# Patient Record
Sex: Female | Born: 1945 | Race: White | Hispanic: No | Smoking: Former smoker
Health system: Southern US, Community
[De-identification: ages and names within clinical notes are randomized; demographics above are authoritative.]

## PROBLEM LIST (undated history)

## (undated) DIAGNOSIS — L659 Nonscarring hair loss, unspecified: Secondary | ICD-10-CM

## (undated) DIAGNOSIS — E785 Hyperlipidemia, unspecified: Secondary | ICD-10-CM

## (undated) DIAGNOSIS — R42 Dizziness and giddiness: Secondary | ICD-10-CM

## (undated) DIAGNOSIS — F329 Major depressive disorder, single episode, unspecified: Secondary | ICD-10-CM

## (undated) DIAGNOSIS — Z923 Personal history of irradiation: Secondary | ICD-10-CM

## (undated) DIAGNOSIS — Z9181 History of falling: Secondary | ICD-10-CM

## (undated) DIAGNOSIS — Z1231 Encounter for screening mammogram for malignant neoplasm of breast: Secondary | ICD-10-CM

## (undated) DIAGNOSIS — F419 Anxiety disorder, unspecified: Secondary | ICD-10-CM

## (undated) DIAGNOSIS — I6521 Occlusion and stenosis of right carotid artery: Secondary | ICD-10-CM

## (undated) DIAGNOSIS — R809 Proteinuria, unspecified: Secondary | ICD-10-CM

## (undated) DIAGNOSIS — R011 Cardiac murmur, unspecified: Secondary | ICD-10-CM

## (undated) DIAGNOSIS — E1022 Type 1 diabetes mellitus with diabetic chronic kidney disease: Secondary | ICD-10-CM

## (undated) DIAGNOSIS — K3184 Gastroparesis: Secondary | ICD-10-CM

## (undated) DIAGNOSIS — J302 Other seasonal allergic rhinitis: Secondary | ICD-10-CM

## (undated) DIAGNOSIS — J309 Allergic rhinitis, unspecified: Secondary | ICD-10-CM

## (undated) DIAGNOSIS — I350 Nonrheumatic aortic (valve) stenosis: Secondary | ICD-10-CM

## (undated) DIAGNOSIS — R928 Other abnormal and inconclusive findings on diagnostic imaging of breast: Secondary | ICD-10-CM

## (undated) DIAGNOSIS — Z6830 Body mass index (BMI) 30.0-30.9, adult: Secondary | ICD-10-CM

## (undated) DIAGNOSIS — C3490 Malignant neoplasm of unspecified part of unspecified bronchus or lung: Secondary | ICD-10-CM

## (undated) DIAGNOSIS — M1990 Unspecified osteoarthritis, unspecified site: Secondary | ICD-10-CM

## (undated) DIAGNOSIS — D649 Anemia, unspecified: Secondary | ICD-10-CM

## (undated) DIAGNOSIS — C801 Malignant (primary) neoplasm, unspecified: Secondary | ICD-10-CM

## (undated) DIAGNOSIS — I951 Orthostatic hypotension: Secondary | ICD-10-CM

## (undated) DIAGNOSIS — Z1331 Encounter for screening for depression: Secondary | ICD-10-CM

## (undated) DIAGNOSIS — E039 Hypothyroidism, unspecified: Secondary | ICD-10-CM

## (undated) DIAGNOSIS — I83812 Varicose veins of left lower extremities with pain: Secondary | ICD-10-CM

## (undated) DIAGNOSIS — N959 Unspecified menopausal and perimenopausal disorder: Secondary | ICD-10-CM

## (undated) DIAGNOSIS — N6019 Diffuse cystic mastopathy of unspecified breast: Secondary | ICD-10-CM

## (undated) DIAGNOSIS — Z1211 Encounter for screening for malignant neoplasm of colon: Secondary | ICD-10-CM

## (undated) DIAGNOSIS — K5909 Other constipation: Secondary | ICD-10-CM

## (undated) DIAGNOSIS — J701 Chronic and other pulmonary manifestations due to radiation: Secondary | ICD-10-CM

## (undated) DIAGNOSIS — E119 Type 2 diabetes mellitus without complications: Secondary | ICD-10-CM

## (undated) DIAGNOSIS — Z1339 Encounter for screening examination for other mental health and behavioral disorders: Secondary | ICD-10-CM

## (undated) DIAGNOSIS — L570 Actinic keratosis: Secondary | ICD-10-CM

## (undated) DIAGNOSIS — J069 Acute upper respiratory infection, unspecified: Secondary | ICD-10-CM

## (undated) DIAGNOSIS — R0602 Shortness of breath: Secondary | ICD-10-CM

## (undated) DIAGNOSIS — M79673 Pain in unspecified foot: Secondary | ICD-10-CM

## (undated) DIAGNOSIS — K219 Gastro-esophageal reflux disease without esophagitis: Secondary | ICD-10-CM

## (undated) DIAGNOSIS — J449 Chronic obstructive pulmonary disease, unspecified: Secondary | ICD-10-CM

## (undated) DIAGNOSIS — N6310 Unspecified lump in the right breast, unspecified quadrant: Secondary | ICD-10-CM

## (undated) DIAGNOSIS — J019 Acute sinusitis, unspecified: Secondary | ICD-10-CM

## (undated) DIAGNOSIS — M722 Plantar fascial fibromatosis: Secondary | ICD-10-CM

## (undated) DIAGNOSIS — I2510 Atherosclerotic heart disease of native coronary artery without angina pectoris: Secondary | ICD-10-CM

## (undated) DIAGNOSIS — L309 Dermatitis, unspecified: Secondary | ICD-10-CM

## (undated) DIAGNOSIS — Z85118 Personal history of other malignant neoplasm of bronchus and lung: Secondary | ICD-10-CM

## (undated) DIAGNOSIS — M791 Myalgia, unspecified site: Secondary | ICD-10-CM

## (undated) DIAGNOSIS — R5383 Other fatigue: Secondary | ICD-10-CM

## (undated) DIAGNOSIS — G629 Polyneuropathy, unspecified: Secondary | ICD-10-CM

## (undated) DIAGNOSIS — I10 Essential (primary) hypertension: Secondary | ICD-10-CM

## (undated) DIAGNOSIS — I5030 Unspecified diastolic (congestive) heart failure: Secondary | ICD-10-CM

## (undated) DIAGNOSIS — Z780 Asymptomatic menopausal state: Secondary | ICD-10-CM

## (undated) DIAGNOSIS — M2550 Pain in unspecified joint: Secondary | ICD-10-CM

## (undated) DIAGNOSIS — R569 Unspecified convulsions: Secondary | ICD-10-CM

## (undated) DIAGNOSIS — F32A Depression, unspecified: Secondary | ICD-10-CM

## (undated) HISTORY — DX: Gastroparesis: K31.84

## (undated) HISTORY — DX: Encounter for screening for depression: Z13.31

## (undated) HISTORY — DX: Unspecified menopausal and perimenopausal disorder: N95.9

## (undated) HISTORY — DX: Essential (primary) hypertension: I10

## (undated) HISTORY — DX: Other fatigue: R53.83

## (undated) HISTORY — DX: Atherosclerotic heart disease of native coronary artery without angina pectoris: I25.10

## (undated) HISTORY — DX: Malignant neoplasm of unspecified part of unspecified bronchus or lung: C34.90

## (undated) HISTORY — DX: Body mass index (BMI) 30.0-30.9, adult: Z68.30

## (undated) HISTORY — DX: Depression, unspecified: F32.A

## (undated) HISTORY — DX: Pain in unspecified foot: M79.673

## (undated) HISTORY — PX: ELBOW SURGERY: SHX618

## (undated) HISTORY — DX: Actinic keratosis: L57.0

## (undated) HISTORY — DX: Shortness of breath: R06.02

## (undated) HISTORY — DX: Varicose veins of left lower extremity with pain: I83.812

## (undated) HISTORY — DX: Personal history of other malignant neoplasm of bronchus and lung: Z85.118

## (undated) HISTORY — DX: Encounter for screening for malignant neoplasm of colon: Z12.11

## (undated) HISTORY — DX: Nonscarring hair loss, unspecified: L65.9

## (undated) HISTORY — DX: Chronic and other pulmonary manifestations due to radiation: J70.1

## (undated) HISTORY — DX: Orthostatic hypotension: I95.1

## (undated) HISTORY — DX: Acute upper respiratory infection, unspecified: J06.9

## (undated) HISTORY — DX: Encounter for screening mammogram for malignant neoplasm of breast: Z12.31

## (undated) HISTORY — DX: Personal history of irradiation: Z92.3

## (undated) HISTORY — PX: CATARACT EXTRACTION, BILATERAL: SHX1313

## (undated) HISTORY — DX: Proteinuria, unspecified: R80.9

## (undated) HISTORY — DX: Cardiac murmur, unspecified: R01.1

## (undated) HISTORY — DX: Occlusion and stenosis of right carotid artery: I65.21

## (undated) HISTORY — DX: Hypothyroidism, unspecified: E03.9

## (undated) HISTORY — DX: Major depressive disorder, single episode, unspecified: F32.9

## (undated) HISTORY — DX: Unspecified lump in the right breast, unspecified quadrant: N63.10

## (undated) HISTORY — DX: Hyperlipidemia, unspecified: E78.5

## (undated) HISTORY — DX: Dermatitis, unspecified: L30.9

## (undated) HISTORY — DX: Plantar fascial fibromatosis: M72.2

## (undated) HISTORY — PX: THYROIDECTOMY: SHX17

## (undated) HISTORY — DX: Anemia, unspecified: D64.9

## (undated) HISTORY — DX: History of falling: Z91.81

## (undated) HISTORY — DX: Myalgia, unspecified site: M79.10

## (undated) HISTORY — PX: CARPAL TUNNEL RELEASE: SHX101

## (undated) HISTORY — DX: Acute sinusitis, unspecified: J01.90

## (undated) HISTORY — DX: Asymptomatic menopausal state: Z78.0

## (undated) HISTORY — PX: BREAST BIOPSY: SHX20

## (undated) HISTORY — PX: SHOULDER SURGERY: SHX246

## (undated) HISTORY — DX: Polyneuropathy, unspecified: G62.9

## (undated) HISTORY — DX: Encounter for screening examination for other mental health and behavioral disorders: Z13.39

## (undated) HISTORY — DX: Nonrheumatic aortic (valve) stenosis: I35.0

## (undated) HISTORY — DX: Other abnormal and inconclusive findings on diagnostic imaging of breast: R92.8

## (undated) HISTORY — DX: Malignant (primary) neoplasm, unspecified: C80.1

## (undated) HISTORY — DX: Type 2 diabetes mellitus without complications: E11.9

## (undated) HISTORY — DX: Dizziness and giddiness: R42

## (undated) HISTORY — DX: Chronic obstructive pulmonary disease, unspecified: J44.9

## (undated) HISTORY — DX: Pain in unspecified joint: M25.50

## (undated) HISTORY — DX: Other constipation: K59.09

## (undated) HISTORY — DX: Other seasonal allergic rhinitis: J30.2

## (undated) HISTORY — DX: Diffuse cystic mastopathy of unspecified breast: N60.19

## (undated) HISTORY — PX: TOTAL ABDOMINAL HYSTERECTOMY: SHX209

## (undated) HISTORY — DX: Allergic rhinitis, unspecified: J30.9

---
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
## 1997-06-12 DIAGNOSIS — C3490 Malignant neoplasm of unspecified part of unspecified bronchus or lung: Secondary | ICD-10-CM | POA: Insufficient documentation

## 1997-08-11 ENCOUNTER — Ambulatory Visit (HOSPITAL_BASED_OUTPATIENT_CLINIC_OR_DEPARTMENT_OTHER): Admission: RE | Admit: 1997-08-11 | Discharge: 1997-08-11 | Payer: Self-pay | Admitting: General Surgery

## 1998-06-06 ENCOUNTER — Ambulatory Visit (HOSPITAL_COMMUNITY): Admission: RE | Admit: 1998-06-06 | Discharge: 1998-06-06 | Payer: Self-pay | Admitting: Gastroenterology

## 1998-08-09 ENCOUNTER — Other Ambulatory Visit: Admission: RE | Admit: 1998-08-09 | Discharge: 1998-08-09 | Payer: Self-pay | Admitting: Obstetrics and Gynecology

## 1998-12-21 ENCOUNTER — Encounter: Payer: Self-pay | Admitting: Hematology and Oncology

## 1998-12-21 ENCOUNTER — Encounter: Admission: RE | Admit: 1998-12-21 | Discharge: 1998-12-21 | Payer: Self-pay | Admitting: Hematology and Oncology

## 1999-04-06 ENCOUNTER — Encounter: Payer: Self-pay | Admitting: Hematology and Oncology

## 1999-04-06 ENCOUNTER — Encounter: Admission: RE | Admit: 1999-04-06 | Discharge: 1999-04-06 | Payer: Self-pay | Admitting: Hematology and Oncology

## 1999-06-04 ENCOUNTER — Encounter: Admission: RE | Admit: 1999-06-04 | Discharge: 1999-06-04 | Payer: Self-pay | Admitting: Hematology and Oncology

## 1999-06-04 ENCOUNTER — Encounter: Payer: Self-pay | Admitting: Hematology and Oncology

## 1999-07-17 ENCOUNTER — Encounter: Admission: RE | Admit: 1999-07-17 | Discharge: 1999-07-17 | Payer: Self-pay | Admitting: Hematology and Oncology

## 1999-07-17 ENCOUNTER — Encounter: Payer: Self-pay | Admitting: Hematology and Oncology

## 1999-10-18 ENCOUNTER — Other Ambulatory Visit: Admission: RE | Admit: 1999-10-18 | Discharge: 1999-10-18 | Payer: Self-pay | Admitting: Obstetrics and Gynecology

## 1999-11-09 ENCOUNTER — Encounter: Payer: Self-pay | Admitting: Obstetrics and Gynecology

## 1999-11-09 ENCOUNTER — Encounter: Admission: RE | Admit: 1999-11-09 | Discharge: 1999-11-09 | Payer: Self-pay | Admitting: Obstetrics and Gynecology

## 2000-01-14 ENCOUNTER — Encounter: Payer: Self-pay | Admitting: Hematology and Oncology

## 2000-01-14 ENCOUNTER — Ambulatory Visit (HOSPITAL_COMMUNITY): Admission: RE | Admit: 2000-01-14 | Discharge: 2000-01-14 | Payer: Self-pay | Admitting: Hematology and Oncology

## 2000-01-15 ENCOUNTER — Ambulatory Visit (HOSPITAL_COMMUNITY): Admission: RE | Admit: 2000-01-15 | Discharge: 2000-01-15 | Payer: Self-pay | Admitting: Hematology and Oncology

## 2000-01-15 ENCOUNTER — Encounter: Payer: Self-pay | Admitting: Hematology and Oncology

## 2000-05-22 ENCOUNTER — Encounter: Payer: Self-pay | Admitting: Hematology and Oncology

## 2000-05-22 ENCOUNTER — Ambulatory Visit (HOSPITAL_COMMUNITY): Admission: RE | Admit: 2000-05-22 | Discharge: 2000-05-22 | Payer: Self-pay | Admitting: Hematology and Oncology

## 2000-06-04 ENCOUNTER — Encounter: Payer: Self-pay | Admitting: Hematology and Oncology

## 2000-06-04 ENCOUNTER — Encounter: Admission: RE | Admit: 2000-06-04 | Discharge: 2000-06-04 | Payer: Self-pay | Admitting: Hematology and Oncology

## 2000-06-10 ENCOUNTER — Ambulatory Visit (HOSPITAL_COMMUNITY): Admission: RE | Admit: 2000-06-10 | Discharge: 2000-06-10 | Payer: Self-pay | Admitting: Hematology and Oncology

## 2000-06-10 ENCOUNTER — Encounter: Payer: Self-pay | Admitting: Hematology and Oncology

## 2001-01-29 ENCOUNTER — Ambulatory Visit (HOSPITAL_COMMUNITY): Admission: RE | Admit: 2001-01-29 | Discharge: 2001-01-29 | Payer: Self-pay | Admitting: Hematology and Oncology

## 2001-01-29 ENCOUNTER — Encounter: Payer: Self-pay | Admitting: Hematology and Oncology

## 2001-08-12 ENCOUNTER — Encounter: Payer: Self-pay | Admitting: Hematology and Oncology

## 2001-08-12 ENCOUNTER — Ambulatory Visit (HOSPITAL_COMMUNITY): Admission: RE | Admit: 2001-08-12 | Discharge: 2001-08-12 | Payer: Self-pay | Admitting: Hematology and Oncology

## 2002-06-08 ENCOUNTER — Encounter: Payer: Self-pay | Admitting: *Deleted

## 2002-06-08 ENCOUNTER — Encounter: Admission: RE | Admit: 2002-06-08 | Discharge: 2002-06-08 | Payer: Self-pay | Admitting: *Deleted

## 2003-03-14 ENCOUNTER — Ambulatory Visit (HOSPITAL_COMMUNITY): Admission: RE | Admit: 2003-03-14 | Discharge: 2003-03-14 | Payer: Self-pay | Admitting: Oncology

## 2003-06-10 ENCOUNTER — Ambulatory Visit (HOSPITAL_COMMUNITY): Admission: RE | Admit: 2003-06-10 | Discharge: 2003-06-10 | Payer: Self-pay | Admitting: Oncology

## 2004-04-02 ENCOUNTER — Ambulatory Visit (HOSPITAL_COMMUNITY): Admission: RE | Admit: 2004-04-02 | Discharge: 2004-04-02 | Payer: Self-pay | Admitting: Oncology

## 2004-04-02 ENCOUNTER — Ambulatory Visit: Payer: Self-pay | Admitting: Oncology

## 2004-05-24 ENCOUNTER — Encounter: Admission: RE | Admit: 2004-05-24 | Discharge: 2004-05-24 | Payer: Self-pay | Admitting: Oncology

## 2004-12-05 ENCOUNTER — Ambulatory Visit: Payer: Self-pay | Admitting: Oncology

## 2005-02-12 ENCOUNTER — Ambulatory Visit: Payer: Self-pay | Admitting: Oncology

## 2005-02-12 ENCOUNTER — Ambulatory Visit (HOSPITAL_COMMUNITY): Admission: RE | Admit: 2005-02-12 | Discharge: 2005-02-12 | Payer: Self-pay | Admitting: Oncology

## 2005-03-11 ENCOUNTER — Ambulatory Visit (HOSPITAL_COMMUNITY): Admission: RE | Admit: 2005-03-11 | Discharge: 2005-03-11 | Payer: Self-pay | Admitting: Oncology

## 2005-06-07 ENCOUNTER — Ambulatory Visit: Payer: Self-pay | Admitting: Oncology

## 2005-06-10 LAB — CBC WITH DIFFERENTIAL/PLATELET
BASO%: 0.2 % (ref 0.0–2.0)
EOS%: 3 % (ref 0.0–7.0)
MCH: 29 pg (ref 26.0–34.0)
MCV: 85.2 fL (ref 81.0–101.0)
MONO%: 7.3 % (ref 0.0–13.0)
RBC: 3.77 10*6/uL (ref 3.70–5.32)
RDW: 14 % (ref 11.3–14.5)

## 2005-06-10 LAB — COMPREHENSIVE METABOLIC PANEL
ALT: 14 U/L (ref 0–40)
AST: 19 U/L (ref 0–37)
Alkaline Phosphatase: 72 U/L (ref 39–117)
CO2: 23 mEq/L (ref 19–32)
Creatinine, Ser: 1 mg/dL (ref 0.4–1.2)
Sodium: 135 mEq/L (ref 135–145)
Total Bilirubin: 0.4 mg/dL (ref 0.3–1.2)
Total Protein: 6.3 g/dL (ref 6.0–8.3)

## 2005-06-10 LAB — IRON AND TIBC
%SAT: 19 % — ABNORMAL LOW (ref 20–55)
TIBC: 258 ug/dL (ref 250–470)
UIBC: 209 ug/dL

## 2005-06-10 LAB — LACTATE DEHYDROGENASE: LDH: 169 U/L (ref 94–250)

## 2005-06-13 ENCOUNTER — Ambulatory Visit (HOSPITAL_COMMUNITY): Admission: RE | Admit: 2005-06-13 | Discharge: 2005-06-13 | Payer: Self-pay | Admitting: Oncology

## 2005-06-18 LAB — FOLATE RBC: RBC Folate: 1074 ng/mL — ABNORMAL HIGH (ref 180–600)

## 2005-06-18 LAB — VITAMIN B12: Vitamin B-12: 322 pg/mL (ref 211–911)

## 2005-06-18 LAB — FERRITIN: Ferritin: 40 ng/mL (ref 10–291)

## 2005-06-18 LAB — IRON AND TIBC
TIBC: 250 ug/dL (ref 250–470)
UIBC: 184 ug/dL

## 2005-07-01 ENCOUNTER — Encounter: Admission: RE | Admit: 2005-07-01 | Discharge: 2005-07-01 | Payer: Self-pay | Admitting: Oncology

## 2005-08-06 ENCOUNTER — Ambulatory Visit: Payer: Self-pay | Admitting: Oncology

## 2005-09-10 LAB — COMPREHENSIVE METABOLIC PANEL
ALT: 14 U/L (ref 0–40)
AST: 21 U/L (ref 0–37)
CO2: 25 mEq/L (ref 19–32)
Sodium: 137 mEq/L (ref 135–145)
Total Bilirubin: 0.4 mg/dL (ref 0.3–1.2)
Total Protein: 6.6 g/dL (ref 6.0–8.3)

## 2005-09-10 LAB — CBC WITH DIFFERENTIAL/PLATELET
BASO%: 1.1 % (ref 0.0–2.0)
EOS%: 2.4 % (ref 0.0–7.0)
LYMPH%: 16.4 % (ref 14.0–48.0)
MCHC: 34 g/dL (ref 32.0–36.0)
MONO#: 0.4 10*3/uL (ref 0.1–0.9)
Platelets: 326 10*3/uL (ref 145–400)
RBC: 3.84 10*6/uL (ref 3.70–5.32)
WBC: 5.7 10*3/uL (ref 3.9–10.0)
lymph#: 0.9 10*3/uL (ref 0.9–3.3)

## 2005-09-10 LAB — LACTATE DEHYDROGENASE: LDH: 179 U/L (ref 94–250)

## 2005-09-25 ENCOUNTER — Ambulatory Visit: Payer: Self-pay | Admitting: Oncology

## 2006-01-03 ENCOUNTER — Ambulatory Visit: Payer: Self-pay | Admitting: Oncology

## 2006-01-15 LAB — CBC WITH DIFFERENTIAL/PLATELET
BASO%: 0.3 % (ref 0.0–2.0)
LYMPH%: 13.8 % — ABNORMAL LOW (ref 14.0–48.0)
MCHC: 33.9 g/dL (ref 32.0–36.0)
MCV: 87.1 fL (ref 81.0–101.0)
MONO%: 7.6 % (ref 0.0–13.0)
Platelets: 332 10*3/uL (ref 145–400)
RBC: 4.16 10*6/uL (ref 3.70–5.32)

## 2006-01-15 LAB — COMPREHENSIVE METABOLIC PANEL
AST: 17 U/L (ref 0–37)
BUN: 16 mg/dL (ref 6–23)
Calcium: 9.7 mg/dL (ref 8.4–10.5)
Chloride: 103 mEq/L (ref 96–112)
Creatinine, Ser: 1.14 mg/dL (ref 0.40–1.20)

## 2006-01-15 LAB — LACTATE DEHYDROGENASE: LDH: 183 U/L (ref 94–250)

## 2006-01-15 LAB — IRON AND TIBC
TIBC: 222 ug/dL — ABNORMAL LOW (ref 250–470)
UIBC: 169 ug/dL

## 2006-05-08 ENCOUNTER — Ambulatory Visit: Payer: Self-pay | Admitting: Oncology

## 2006-05-13 ENCOUNTER — Ambulatory Visit (HOSPITAL_COMMUNITY): Admission: RE | Admit: 2006-05-13 | Discharge: 2006-05-13 | Payer: Self-pay | Admitting: Oncology

## 2006-05-13 LAB — CBC WITH DIFFERENTIAL/PLATELET
Basophils Absolute: 0 10*3/uL (ref 0.0–0.1)
Eosinophils Absolute: 0.1 10*3/uL (ref 0.0–0.5)
HGB: 13.1 g/dL (ref 11.6–15.9)
MONO#: 0.4 10*3/uL (ref 0.1–0.9)
MONO%: 5.2 % (ref 0.0–13.0)
NEUT#: 5.7 10*3/uL (ref 1.5–6.5)
RBC: 4.46 10*6/uL (ref 3.70–5.32)
RDW: 13.8 % (ref 11.3–14.5)
WBC: 7.1 10*3/uL (ref 3.9–10.0)
lymph#: 0.8 10*3/uL — ABNORMAL LOW (ref 0.9–3.3)

## 2006-05-13 LAB — COMPREHENSIVE METABOLIC PANEL
Albumin: 4.1 g/dL (ref 3.5–5.2)
Alkaline Phosphatase: 83 U/L (ref 39–117)
BUN: 19 mg/dL (ref 6–23)
Calcium: 9.8 mg/dL (ref 8.4–10.5)
Chloride: 101 mEq/L (ref 96–112)
Glucose, Bld: 225 mg/dL — ABNORMAL HIGH (ref 70–99)
Potassium: 5 mEq/L (ref 3.5–5.3)
Sodium: 136 mEq/L (ref 135–145)
Total Protein: 6.6 g/dL (ref 6.0–8.3)

## 2006-05-13 LAB — IRON AND TIBC
%SAT: 24 % (ref 20–55)
Iron: 61 ug/dL (ref 42–145)
UIBC: 196 ug/dL

## 2006-05-13 LAB — FERRITIN: Ferritin: 160 ng/mL (ref 10–291)

## 2006-09-16 ENCOUNTER — Ambulatory Visit: Payer: Self-pay | Admitting: Oncology

## 2006-09-18 LAB — CBC WITH DIFFERENTIAL/PLATELET
BASO%: 0.9 % (ref 0.0–2.0)
EOS%: 2.5 % (ref 0.0–7.0)
MCH: 29.5 pg (ref 26.0–34.0)
MCHC: 34.5 g/dL (ref 32.0–36.0)
MONO#: 0.5 10*3/uL (ref 0.1–0.9)
RBC: 4.1 10*6/uL (ref 3.70–5.32)
RDW: 13.9 % (ref 11.3–14.5)
WBC: 6.7 10*3/uL (ref 3.9–10.0)
lymph#: 0.8 10*3/uL — ABNORMAL LOW (ref 0.9–3.3)

## 2006-09-18 LAB — COMPREHENSIVE METABOLIC PANEL
ALT: 12 U/L (ref 0–35)
AST: 16 U/L (ref 0–37)
Albumin: 3.7 g/dL (ref 3.5–5.2)
Calcium: 9.5 mg/dL (ref 8.4–10.5)
Chloride: 103 mEq/L (ref 96–112)
Creatinine, Ser: 1.01 mg/dL (ref 0.40–1.20)
Potassium: 5.3 mEq/L (ref 3.5–5.3)
Sodium: 138 mEq/L (ref 135–145)
Total Protein: 6.2 g/dL (ref 6.0–8.3)

## 2006-09-18 LAB — IRON AND TIBC
%SAT: 30 % (ref 20–55)
Iron: 74 ug/dL (ref 42–145)
TIBC: 243 ug/dL — ABNORMAL LOW (ref 250–470)
UIBC: 169 ug/dL

## 2006-09-18 LAB — LACTATE DEHYDROGENASE: LDH: 161 U/L (ref 94–250)

## 2007-03-17 ENCOUNTER — Ambulatory Visit: Payer: Self-pay | Admitting: Oncology

## 2007-03-19 LAB — CBC WITH DIFFERENTIAL/PLATELET
Basophils Absolute: 0 10*3/uL (ref 0.0–0.1)
EOS%: 2.7 % (ref 0.0–7.0)
HGB: 12.5 g/dL (ref 11.6–15.9)
LYMPH%: 15.3 % (ref 14.0–48.0)
MCHC: 34 g/dL (ref 32.0–36.0)
NEUT#: 5.1 10*3/uL (ref 1.5–6.5)
NEUT%: 74.2 % (ref 39.6–76.8)
RBC: 4.43 10*6/uL (ref 3.70–5.32)
lymph#: 1 10*3/uL (ref 0.9–3.3)

## 2007-03-19 LAB — COMPREHENSIVE METABOLIC PANEL
AST: 19 U/L (ref 0–37)
Alkaline Phosphatase: 85 U/L (ref 39–117)
BUN: 22 mg/dL (ref 6–23)
Calcium: 9.4 mg/dL (ref 8.4–10.5)
Sodium: 135 mEq/L (ref 135–145)
Total Bilirubin: 0.4 mg/dL (ref 0.3–1.2)
Total Protein: 6.6 g/dL (ref 6.0–8.3)

## 2007-03-19 LAB — IRON AND TIBC
%SAT: 19 % — ABNORMAL LOW (ref 20–55)
Iron: 45 ug/dL (ref 42–145)
UIBC: 196 ug/dL

## 2007-03-19 LAB — LACTATE DEHYDROGENASE: LDH: 169 U/L (ref 94–250)

## 2007-09-17 ENCOUNTER — Ambulatory Visit: Payer: Self-pay | Admitting: Oncology

## 2007-09-21 ENCOUNTER — Ambulatory Visit (HOSPITAL_COMMUNITY): Admission: RE | Admit: 2007-09-21 | Discharge: 2007-09-21 | Payer: Self-pay | Admitting: Oncology

## 2007-09-21 LAB — CBC WITH DIFFERENTIAL/PLATELET
BASO%: 0.7 % (ref 0.0–2.0)
Basophils Absolute: 0 10*3/uL (ref 0.0–0.1)
Eosinophils Absolute: 0.3 10*3/uL (ref 0.0–0.5)
LYMPH%: 14.6 % (ref 14.0–48.0)
MCH: 28.7 pg (ref 26.0–34.0)
MCHC: 33.6 g/dL (ref 32.0–36.0)
MCV: 85.2 fL (ref 81.0–101.0)
MONO%: 7 % (ref 0.0–13.0)
RDW: 14.6 % — ABNORMAL HIGH (ref 11.3–14.5)
WBC: 6.7 10*3/uL (ref 3.9–10.0)

## 2007-09-21 LAB — FERRITIN: Ferritin: 186 ng/mL (ref 10–291)

## 2007-09-21 LAB — COMPREHENSIVE METABOLIC PANEL
AST: 16 U/L (ref 0–37)
Calcium: 9.9 mg/dL (ref 8.4–10.5)
Creatinine, Ser: 0.97 mg/dL (ref 0.40–1.20)
Glucose, Bld: 217 mg/dL — ABNORMAL HIGH (ref 70–99)
Potassium: 5.7 mEq/L — ABNORMAL HIGH (ref 3.5–5.3)
Sodium: 136 mEq/L (ref 135–145)
Total Protein: 6.7 g/dL (ref 6.0–8.3)

## 2007-09-21 LAB — IRON AND TIBC
Iron: 82 ug/dL (ref 42–145)
UIBC: 165 ug/dL

## 2007-09-21 LAB — LACTATE DEHYDROGENASE: LDH: 178 U/L (ref 94–250)

## 2008-03-22 ENCOUNTER — Ambulatory Visit: Payer: Self-pay | Admitting: Oncology

## 2008-09-23 ENCOUNTER — Ambulatory Visit: Payer: Self-pay | Admitting: Oncology

## 2008-09-27 ENCOUNTER — Ambulatory Visit (HOSPITAL_COMMUNITY): Admission: RE | Admit: 2008-09-27 | Discharge: 2008-09-27 | Payer: Self-pay | Admitting: Oncology

## 2008-09-27 LAB — COMPREHENSIVE METABOLIC PANEL
ALT: 12 U/L (ref 0–35)
Albumin: 3.9 g/dL (ref 3.5–5.2)
Alkaline Phosphatase: 77 U/L (ref 39–117)
CO2: 24 mEq/L (ref 19–32)
Creatinine, Ser: 1.04 mg/dL (ref 0.40–1.20)
Glucose, Bld: 192 mg/dL — ABNORMAL HIGH (ref 70–99)
Total Protein: 6.3 g/dL (ref 6.0–8.3)

## 2008-09-27 LAB — CBC WITH DIFFERENTIAL/PLATELET
BASO%: 0.7 % (ref 0.0–2.0)
EOS%: 2.7 % (ref 0.0–7.0)
HCT: 37.4 % (ref 34.8–46.6)
HGB: 12.5 g/dL (ref 11.6–15.9)
LYMPH%: 14.3 % (ref 14.0–49.7)
MCH: 28.5 pg (ref 25.1–34.0)
MCHC: 33.5 g/dL (ref 31.5–36.0)
MONO#: 0.4 10*3/uL (ref 0.1–0.9)
NEUT#: 4.1 10*3/uL (ref 1.5–6.5)
NEUT%: 74.3 % (ref 38.4–76.8)
RDW: 14.8 % — ABNORMAL HIGH (ref 11.2–14.5)

## 2008-09-27 LAB — IRON AND TIBC
%SAT: 42 % (ref 20–55)
Iron: 96 ug/dL (ref 42–145)
TIBC: 230 ug/dL — ABNORMAL LOW (ref 250–470)

## 2008-09-27 LAB — FERRITIN: Ferritin: 106 ng/mL (ref 10–291)

## 2009-09-29 ENCOUNTER — Ambulatory Visit: Payer: Self-pay | Admitting: Oncology

## 2009-12-01 ENCOUNTER — Ambulatory Visit: Payer: Self-pay | Admitting: Oncology

## 2010-03-04 ENCOUNTER — Encounter (HOSPITAL_COMMUNITY): Payer: Self-pay | Admitting: Oncology

## 2011-02-18 ENCOUNTER — Encounter: Payer: Self-pay | Admitting: Cardiovascular Disease

## 2011-04-12 ENCOUNTER — Encounter: Payer: Self-pay | Admitting: Cardiovascular Disease

## 2011-05-28 ENCOUNTER — Institutional Professional Consult (permissible substitution): Payer: Self-pay | Admitting: Cardiology

## 2011-06-12 ENCOUNTER — Encounter: Payer: Self-pay | Admitting: Cardiovascular Disease

## 2011-06-12 ENCOUNTER — Encounter: Payer: Self-pay | Admitting: *Deleted

## 2011-06-13 ENCOUNTER — Encounter: Payer: Self-pay | Admitting: Cardiovascular Disease

## 2011-06-13 ENCOUNTER — Ambulatory Visit (INDEPENDENT_AMBULATORY_CARE_PROVIDER_SITE_OTHER): Payer: Medicare Other | Admitting: Cardiovascular Disease

## 2011-06-13 VITALS — BP 155/75 | HR 81 | Wt 184.0 lb

## 2011-06-13 DIAGNOSIS — L570 Actinic keratosis: Secondary | ICD-10-CM | POA: Insufficient documentation

## 2011-06-13 DIAGNOSIS — M810 Age-related osteoporosis without current pathological fracture: Secondary | ICD-10-CM | POA: Insufficient documentation

## 2011-06-13 DIAGNOSIS — G609 Hereditary and idiopathic neuropathy, unspecified: Secondary | ICD-10-CM

## 2011-06-13 DIAGNOSIS — E559 Vitamin D deficiency, unspecified: Secondary | ICD-10-CM | POA: Insufficient documentation

## 2011-06-13 DIAGNOSIS — M79673 Pain in unspecified foot: Secondary | ICD-10-CM | POA: Insufficient documentation

## 2011-06-13 DIAGNOSIS — R002 Palpitations: Secondary | ICD-10-CM

## 2011-06-13 DIAGNOSIS — E039 Hypothyroidism, unspecified: Secondary | ICD-10-CM

## 2011-06-13 DIAGNOSIS — N951 Menopausal and female climacteric states: Secondary | ICD-10-CM

## 2011-06-13 DIAGNOSIS — R0989 Other specified symptoms and signs involving the circulatory and respiratory systems: Secondary | ICD-10-CM

## 2011-06-13 DIAGNOSIS — R5381 Other malaise: Secondary | ICD-10-CM

## 2011-06-13 DIAGNOSIS — C3490 Malignant neoplasm of unspecified part of unspecified bronchus or lung: Secondary | ICD-10-CM

## 2011-06-13 DIAGNOSIS — M722 Plantar fascial fibromatosis: Secondary | ICD-10-CM

## 2011-06-13 DIAGNOSIS — G629 Polyneuropathy, unspecified: Secondary | ICD-10-CM | POA: Insufficient documentation

## 2011-06-13 DIAGNOSIS — IMO0001 Reserved for inherently not codable concepts without codable children: Secondary | ICD-10-CM

## 2011-06-13 DIAGNOSIS — K3184 Gastroparesis: Secondary | ICD-10-CM | POA: Insufficient documentation

## 2011-06-13 DIAGNOSIS — C801 Malignant (primary) neoplasm, unspecified: Secondary | ICD-10-CM

## 2011-06-13 DIAGNOSIS — E119 Type 2 diabetes mellitus without complications: Secondary | ICD-10-CM

## 2011-06-13 DIAGNOSIS — R5383 Other fatigue: Secondary | ICD-10-CM

## 2011-06-13 DIAGNOSIS — R809 Proteinuria, unspecified: Secondary | ICD-10-CM

## 2011-06-13 DIAGNOSIS — I951 Orthostatic hypotension: Secondary | ICD-10-CM

## 2011-06-13 DIAGNOSIS — E785 Hyperlipidemia, unspecified: Secondary | ICD-10-CM | POA: Insufficient documentation

## 2011-06-13 DIAGNOSIS — M79609 Pain in unspecified limb: Secondary | ICD-10-CM

## 2011-06-13 DIAGNOSIS — M2550 Pain in unspecified joint: Secondary | ICD-10-CM

## 2011-06-13 DIAGNOSIS — M791 Myalgia, unspecified site: Secondary | ICD-10-CM | POA: Insufficient documentation

## 2011-06-13 DIAGNOSIS — R319 Hematuria, unspecified: Secondary | ICD-10-CM | POA: Insufficient documentation

## 2011-06-13 DIAGNOSIS — L851 Acquired keratosis [keratoderma] palmaris et plantaris: Secondary | ICD-10-CM

## 2011-06-13 DIAGNOSIS — R011 Cardiac murmur, unspecified: Secondary | ICD-10-CM | POA: Insufficient documentation

## 2011-06-13 DIAGNOSIS — Z780 Asymptomatic menopausal state: Secondary | ICD-10-CM

## 2011-06-13 DIAGNOSIS — R0602 Shortness of breath: Secondary | ICD-10-CM

## 2011-06-13 DIAGNOSIS — R079 Chest pain, unspecified: Secondary | ICD-10-CM | POA: Insufficient documentation

## 2011-06-13 HISTORY — DX: Other specified symptoms and signs involving the circulatory and respiratory systems: R09.89

## 2011-06-13 HISTORY — DX: Palpitations: R00.2

## 2011-06-13 NOTE — Assessment & Plan Note (Signed)
Unfortunately holter already done so cant give her event monitor.  Check myovue and echo F/U

## 2011-06-13 NOTE — Patient Instructions (Addendum)
Your physician recommends that you schedule a follow-up appointment in: AFTER TESTS DONE  Your physician recommends that you continue on your current medications as directed. Please refer to the Current Medication list given to you today. Your physician has requested that you have en exercise stress myoview. For further information please visit [email or web address] Please follow instruction sheet, as given. DX CHEST PAIN Your physician has requested that you have an echocardiogram. Echocardiography is a painless test that uses sound waves to create images of your heart. It provides your doctor with information about the size and shape of your heart and how well your heart's chambers and valves are working. This procedure takes approximately one hour. There are no restrictions for this procedure. DX AORTIC STENOSIS Your physician has requested that you have a carotid duplex. This test is an ultrasound of the carotid arteries in your neck. It looks at blood flow through these arteries that supply the brain with blood. Allow one hour for this exam. There are no restrictions or special instructions. DX BRUIT

## 2011-06-13 NOTE — Assessment & Plan Note (Signed)
F/U oncologist in [place].  Radiation and previous smoking likely contributes to AS

## 2011-06-13 NOTE — Assessment & Plan Note (Signed)
She appears to have significant AS  F/U echo

## 2011-06-13 NOTE — Assessment & Plan Note (Signed)
Needs stress myovue given risk factors and likely significant AS.  No acute ischemic changes on ECG

## 2011-06-13 NOTE — Assessment & Plan Note (Signed)
Cholesterol is at goal.  Continue current dose of statin and diet Rx.  No myalgias or side effects.  F/U  LFT's in 6 months. No results found for this basename: LDLCALC  TC 240  LDL 152  F/U primary given intolerance to statins and zetia

## 2011-06-13 NOTE — Progress Notes (Signed)
Patient ID: Jaclyn [name], female   DOB: Jul 15, 1945, 66 y.o.   MRN: [id number] 66 yo referred by Dr Erenest Blank in [place].  She has been having palpitations with presyncope and SSCP.  Reviewed records including office notes, labs and holter monitor.  She was told she had MVP many years ago.  She had only a 24 hours holter with occasional PVC;s but she said her palpitaitons were not active that day.  Can have lightheadedness and chest pressure with palpitaoitns.  They last a minute or two. No resting pain.  Some dyspnea with exertion.  12 year survivor of lung cancer.  Got chemo and radiation and use to smoke "like a freight train".  No history of CAD.  No resting SSCP.  Compliant with meds  Thinks her cozaar has made palpitations worse the last month.  Intolerant to statins. CRF HTN cholesterol and previous smoking  ROS: Denies fever, malais, weight loss, blurry vision, decreased visual acuity, cough, sputum, SOB, hemoptysis, pleuritic pain, palpitaitons, heartburn, abdominal pain, melena, lower extremity edema, claudication, or rash.  All other systems reviewed and negative   General: Affect appropriate Previous smoker looks older than stated age HEENT: normal Neck supple with no adenopathy JVP normal bilateral bruits no thyromegaly Lungs clear with no wheezing and good diaphragmatic motion Heart:  S1/S2dimiinished with significant AS  murmur,rub, gallop or click PMI normal Abdomen: benighn, BS positve, no tenderness, no AAA no bruit.  No HSM or HJR Distal pulses intact with no bruits No edema Neuro non-focal Skin warm and dry No muscular weakness  Medications Current Outpatient Prescriptions  Medication Sig Dispense Refill  . aspirin 81 MG tablet Take 81 mg by mouth daily.      . Cholecalciferol (VITAMIN D) 2000 UNITS CAPS Take 1 capsule by mouth daily.      Marland Kitchen gabapentin (NEURONTIN) 300 MG capsule as needed.      Marland Kitchen levothyroxine (SYNTHROID, LEVOTHROID) 112 MCG  tablet Take 112 mcg by mouth daily.      Marland Kitchen losartan (COZAAR) 50 MG tablet Take 50 mg by mouth daily.      Marland Kitchen NOVOLOG 100 UNIT/ML injection As directed      . omeprazole (PRILOSEC) 20 MG capsule Take 20 mg by mouth as needed.         Allergies Statins  Family History: Family History  Problem Relation Age of Onset  . Alzheimer's disease    . Anemia    . Cancer    . Cirrhosis    . Colon cancer    . Coronary artery disease    . Heart attack    . Heart disease      Social History: History   Social History  . Marital Status: Divorced    Spouse Name: N/A    Number of Children: N/A  . Years of Education: N/A   Occupational History  . Not on file.   Social History Main Topics  . Smoking status: Former Games developer  . Smokeless tobacco: Not on file  . Alcohol Use: Not on file  . Drug Use: Not on file  . Sexually Active: Not on file   Other Topics Concern  . Not on file   Social History Narrative  . No narrative on file    Electrocardiogram:  04/12/11  NSR rate 76 normal no LVH  Assessment and Plan

## 2011-06-13 NOTE — Assessment & Plan Note (Signed)
Cannot exclude referred AS murmur  F/U carotid duplex

## 2011-06-17 ENCOUNTER — Other Ambulatory Visit: Payer: Self-pay | Admitting: Cardiology

## 2011-06-17 DIAGNOSIS — R0989 Other specified symptoms and signs involving the circulatory and respiratory systems: Secondary | ICD-10-CM

## 2011-06-20 ENCOUNTER — Other Ambulatory Visit: Payer: Self-pay

## 2011-06-20 ENCOUNTER — Ambulatory Visit (HOSPITAL_COMMUNITY): Payer: Medicare Other | Attending: Cardiology

## 2011-06-20 DIAGNOSIS — R011 Cardiac murmur, unspecified: Secondary | ICD-10-CM

## 2011-06-20 DIAGNOSIS — I359 Nonrheumatic aortic valve disorder, unspecified: Secondary | ICD-10-CM | POA: Insufficient documentation

## 2011-06-20 DIAGNOSIS — E785 Hyperlipidemia, unspecified: Secondary | ICD-10-CM | POA: Insufficient documentation

## 2011-06-20 DIAGNOSIS — J4489 Other specified chronic obstructive pulmonary disease: Secondary | ICD-10-CM | POA: Insufficient documentation

## 2011-06-20 DIAGNOSIS — I059 Rheumatic mitral valve disease, unspecified: Secondary | ICD-10-CM | POA: Insufficient documentation

## 2011-06-20 DIAGNOSIS — J449 Chronic obstructive pulmonary disease, unspecified: Secondary | ICD-10-CM | POA: Insufficient documentation

## 2011-06-20 DIAGNOSIS — E119 Type 2 diabetes mellitus without complications: Secondary | ICD-10-CM | POA: Insufficient documentation

## 2011-06-24 ENCOUNTER — Encounter (INDEPENDENT_AMBULATORY_CARE_PROVIDER_SITE_OTHER): Payer: Medicare Other

## 2011-06-24 DIAGNOSIS — R42 Dizziness and giddiness: Secondary | ICD-10-CM

## 2011-06-24 DIAGNOSIS — R0989 Other specified symptoms and signs involving the circulatory and respiratory systems: Secondary | ICD-10-CM

## 2011-06-24 DIAGNOSIS — I6529 Occlusion and stenosis of unspecified carotid artery: Secondary | ICD-10-CM

## 2011-06-26 ENCOUNTER — Ambulatory Visit (HOSPITAL_COMMUNITY): Payer: Medicare Other | Attending: Cardiology | Admitting: Radiology

## 2011-06-26 ENCOUNTER — Encounter (HOSPITAL_COMMUNITY): Payer: Medicare Other

## 2011-06-26 DIAGNOSIS — R002 Palpitations: Secondary | ICD-10-CM | POA: Insufficient documentation

## 2011-06-26 DIAGNOSIS — R0609 Other forms of dyspnea: Secondary | ICD-10-CM | POA: Insufficient documentation

## 2011-06-26 DIAGNOSIS — M542 Cervicalgia: Secondary | ICD-10-CM | POA: Insufficient documentation

## 2011-06-26 DIAGNOSIS — Z8249 Family history of ischemic heart disease and other diseases of the circulatory system: Secondary | ICD-10-CM | POA: Insufficient documentation

## 2011-06-26 DIAGNOSIS — R55 Syncope and collapse: Secondary | ICD-10-CM

## 2011-06-26 DIAGNOSIS — E119 Type 2 diabetes mellitus without complications: Secondary | ICD-10-CM | POA: Insufficient documentation

## 2011-06-26 DIAGNOSIS — R0989 Other specified symptoms and signs involving the circulatory and respiratory systems: Secondary | ICD-10-CM | POA: Insufficient documentation

## 2011-06-26 DIAGNOSIS — R42 Dizziness and giddiness: Secondary | ICD-10-CM | POA: Insufficient documentation

## 2011-06-26 DIAGNOSIS — E785 Hyperlipidemia, unspecified: Secondary | ICD-10-CM | POA: Insufficient documentation

## 2011-06-26 DIAGNOSIS — Z794 Long term (current) use of insulin: Secondary | ICD-10-CM | POA: Insufficient documentation

## 2011-06-26 DIAGNOSIS — R079 Chest pain, unspecified: Secondary | ICD-10-CM | POA: Insufficient documentation

## 2011-06-26 DIAGNOSIS — I10 Essential (primary) hypertension: Secondary | ICD-10-CM | POA: Insufficient documentation

## 2011-06-26 DIAGNOSIS — Z87891 Personal history of nicotine dependence: Secondary | ICD-10-CM | POA: Insufficient documentation

## 2011-06-26 MED ORDER — TECHNETIUM TC 99M TETROFOSMIN IV KIT
33.0000 | PACK | Freq: Once | INTRAVENOUS | Status: AC | PRN
  Administered 2011-06-26: 33 via INTRAVENOUS

## 2011-06-26 MED ORDER — TECHNETIUM TC 99M TETROFOSMIN IV KIT
11.0000 | PACK | Freq: Once | INTRAVENOUS | Status: AC | PRN
  Administered 2011-06-26: 11 via INTRAVENOUS

## 2011-06-26 NOTE — Progress Notes (Signed)
[facility] SITE 3 NUCLEAR MED [address] [place] [address] [phone]  Cardiology Nuclear Med Study  Jaclyn [name] is a 66 y.o. female     MRN : [id number]     DOB: [birth year]  Procedure Date: 06/26/2011  Nuclear Med Background Indication for Stress Test:  Evaluation for Ischemia History:  H/O Chemo; ~15 yrs ago GXT:OK per patient; 06/20/11 EF=55-60%, moderate AS, mild MR Cardiac Risk Factors: Family History - CAD, History of Smoking, Hypertension, IDDM Type 2 and Lipids  Symptoms:  Difficult Historian; Chest/Neck Pain (last episode of chest discomfort was about one month ago), Dizziness, DOE, Near Syncope and Palpitations   Nuclear Pre-Procedure Caffeine/Decaff Intake:  None NPO After: 7:30am   Lungs:  Clear. IV 0.9% NS with Angio Cath:  22g  IV Site: R Hand  IV Started by:  Stanton Kidney, EMT-P  Chest Size (in):  42 Cup Size: C  Height: 5\' 7"  (1.702 m)  Weight:  185 lb (83.915 kg)  BMI:  Body mass index is 28.97 kg/(m^2). Tech Comments:  CBG=333 @ 11:30am, per patient.    Nuclear Med Study 1 or 2 day study: 1 day  Stress Test Type:  Stress  Reading MD: Willa Rough, MD  Order Authorizing Provider:  Charlton Haws, MD  Resting Radionuclide: Technetium 62m Tetrofosmin  Resting Radionuclide Dose: 11.0 mCi   Stress Radionuclide:  Technetium 43m Tetrofosmin  Stress Radionuclide Dose: 33.0 mCi           Stress Protocol Rest HR: 78 Stress HR: 153  Rest BP: 179/64 Stress BP: 196/69  Exercise Time (min): 4:00 METS: 5.2   Predicted Max HR: 153 bpm % Max HR: 100 bpm Rate Pressure Product: 41324   Dose of Adenosine (mg):  n/a Dose of Lexiscan: n/a mg  Dose of Atropine (mg): n/a Dose of Dobutamine: n/a mcg/kg/min (at max HR)  Stress Test Technologist: Smiley Houseman, CMA-N  Nuclear Technologist:  Domenic Polite, CNMT     Rest Procedure:  Myocardial perfusion imaging was performed at rest 45 minutes following the intravenous administration of  Technetium 67m Tetrofosmin.  Rest ECG: No acute changes with occasional PVC's.  Stress Procedure:  The patient exercised on the treadmill utilizing the Bruce protocol for four minutes. She then stopped due to significant dyspnea with O2 SAT of 95% and marked ST-T wave changes that continued late into recovery.   She denied any chest pain.  Technetium 33m Tetrofosmin was injected at peak exercise and myocardial perfusion imaging was performed after a brief delay.  Images and EKG's were discussed with Dr. Johney Frame (DOD) and Dr. Myrtis Ser (Reader).  Stress ECG: The patient walked on the treadmill for 4 minutes. She developed significant shortness of breath. There was no significant chest pain. She develops significant EKG changes with1-45mm downsloping ST depression. There were no significant arrhythmias.  QPS Raw Data Images:  Normal; no motion artifact; normal heart/lung ratio. Stress Images:  There is moderately severe decreased uptake in a moderate area of affecting the apical inferolateral segment, apical anterolateral segment and the apical cap.  Rest Images:  There is mild improvement in the defects noted on the stress images. Subtraction (SDS):  There is mild ischemia in the apical inferolateral wall anterolateral wall and apical cap. Transient Ischemic Dilatation (Normal <1.22):  1.06 Lung/Heart Ratio (Normal <0.45):  0.32  Quantitative Gated Spect Images QGS EDV:  98 ml QGS ESV:  39 ml  Impression Exercise Capacity:  Fair exercise capacity. BP Response:  Normal blood pressure response. Clinical Symptoms:  significant shortness of breath ECG Impression:  Significant EKG changes as outlined above. Comparison with Prior Nuclear Study: No previous nuclear study performed  Overall Impression:  The study is abnormal. The patient has shortness of breath with no chest pain. There were significant EKG changes with 1-14mm downsloping ST depression with stress. There is scar and ischemia in the  apical aspect of the inferolateral wall and the apical anterolateral wall. This is also present at the apical cap.  LV Ejection Fraction: 60%.  LV Wall Motion:    There is hypokinesis in several apical segments.  Willa Rough, MD

## 2011-06-28 ENCOUNTER — Telehealth: Payer: Self-pay | Admitting: Cardiovascular Disease

## 2011-06-28 NOTE — Telephone Encounter (Signed)
New msg Pt wants to know test results from Wednesday. Please call

## 2011-06-28 NOTE — Telephone Encounter (Signed)
Pt aware of Carotid doppler results. Will await call back regarding stress test results. Mylo Red RN

## 2011-07-01 ENCOUNTER — Encounter: Payer: Self-pay | Admitting: *Deleted

## 2011-07-01 NOTE — Telephone Encounter (Signed)
PT AWARE OF MYOVIEW RESULTS  CATH SET UP FOR 07-12-11 WITH DR Eden Emms .Zack Seal

## 2011-07-02 ENCOUNTER — Encounter: Payer: Self-pay | Admitting: Cardiovascular Disease

## 2011-07-09 ENCOUNTER — Other Ambulatory Visit: Payer: Self-pay | Admitting: Cardiovascular Disease

## 2011-07-11 ENCOUNTER — Ambulatory Visit: Payer: Medicare Other | Admitting: Cardiovascular Disease

## 2011-07-11 ENCOUNTER — Ambulatory Visit: Payer: Medicare Other | Admitting: Physician Assistant

## 2011-07-12 ENCOUNTER — Encounter (HOSPITAL_BASED_OUTPATIENT_CLINIC_OR_DEPARTMENT_OTHER): Admission: RE | Payer: Self-pay | Source: Ambulatory Visit

## 2011-07-12 ENCOUNTER — Inpatient Hospital Stay (HOSPITAL_BASED_OUTPATIENT_CLINIC_OR_DEPARTMENT_OTHER): Admission: RE | Admit: 2011-07-12 | Payer: Medicare Other | Source: Ambulatory Visit | Admitting: Cardiovascular Disease

## 2011-07-12 SURGERY — JV LEFT HEART CATHETERIZATION WITH CORONARY ANGIOGRAM
Anesthesia: Moderate Sedation

## 2011-07-24 ENCOUNTER — Encounter: Payer: Self-pay | Admitting: Cardiovascular Disease

## 2011-08-12 ENCOUNTER — Ambulatory Visit (INDEPENDENT_AMBULATORY_CARE_PROVIDER_SITE_OTHER): Payer: Medicare Other | Admitting: Cardiovascular Disease

## 2011-08-12 ENCOUNTER — Other Ambulatory Visit: Payer: Self-pay | Admitting: Cardiovascular Disease

## 2011-08-12 ENCOUNTER — Encounter: Payer: Self-pay | Admitting: *Deleted

## 2011-08-12 ENCOUNTER — Encounter: Payer: Self-pay | Admitting: Cardiovascular Disease

## 2011-08-12 VITALS — BP 149/69 | HR 85 | Ht 67.0 in | Wt 183.0 lb

## 2011-08-12 DIAGNOSIS — Z01810 Encounter for preprocedural cardiovascular examination: Secondary | ICD-10-CM

## 2011-08-12 DIAGNOSIS — I359 Nonrheumatic aortic valve disorder, unspecified: Secondary | ICD-10-CM

## 2011-08-12 DIAGNOSIS — I350 Nonrheumatic aortic (valve) stenosis: Secondary | ICD-10-CM

## 2011-08-12 DIAGNOSIS — E119 Type 2 diabetes mellitus without complications: Secondary | ICD-10-CM

## 2011-08-12 DIAGNOSIS — R0989 Other specified symptoms and signs involving the circulatory and respiratory systems: Secondary | ICD-10-CM

## 2011-08-12 DIAGNOSIS — M459 Ankylosing spondylitis of unspecified sites in spine: Secondary | ICD-10-CM

## 2011-08-12 DIAGNOSIS — R011 Cardiac murmur, unspecified: Secondary | ICD-10-CM

## 2011-08-12 LAB — BASIC METABOLIC PANEL
BUN: 21 mg/dL (ref 6–23)
CO2: 26 mEq/L (ref 19–32)
Calcium: 10.5 mg/dL (ref 8.4–10.5)
Creatinine, Ser: 1.1 mg/dL (ref 0.4–1.2)
GFR: 50.98 mL/min — ABNORMAL LOW (ref >60.00)
Glucose, Bld: 92 mg/dL (ref 70–99)

## 2011-08-12 LAB — CBC WITH DIFFERENTIAL/PLATELET
Eosinophils Relative: 2.5 % (ref 0.0–5.0)
HCT: 39.3 % (ref 36.0–46.0)
Hemoglobin: 12.7 g/dL (ref 12.0–15.0)
Lymphs Abs: 1.1 10*3/uL (ref 0.7–4.0)
MCV: 85.1 fl (ref 78.0–100.0)
Monocytes Absolute: 0.5 10*3/uL (ref 0.1–1.0)
Monocytes Relative: 7.4 % (ref 3.0–12.0)
Neutro Abs: 5.3 10*3/uL (ref 1.4–7.7)
RDW: 15.6 % — ABNORMAL HIGH (ref 11.5–14.6)
WBC: 7.2 10*3/uL (ref 4.5–10.5)

## 2011-08-12 NOTE — Assessment & Plan Note (Signed)
Moderate AS with mean gradient 23 by echo. F/U echo in 6 months. Assess pullback gradient during cath

## 2011-08-12 NOTE — Assessment & Plan Note (Signed)
Discussed low carb diet.  Target hemoglobin A1c is 6.5 or less.  Continue current medications. Will keep insulin pump going and have her eat breakfast before cath

## 2011-08-12 NOTE — Assessment & Plan Note (Signed)
No disease by duplex likely referred AS murmur

## 2011-08-12 NOTE — Assessment & Plan Note (Signed)
IDDM, PVC;s and abnormal myovue.  Right and left heart cath indicated to r/o CAD and further assess moderat AS Risks discussed willing to proceed

## 2011-08-12 NOTE — Progress Notes (Signed)
Patient ID: Jaclyn [name], female   DOB: 02/10/1946, 66 y.o.   MRN: [id number] 66 yo referred by Dr Sarah Jeanes [facility] in [place]. She has been having palpitations with presyncope and SSCP. Reviewed records including office notes, labs and holter monitor. She was told she had MVP many years ago. She had only a 24 hours holter with occasional PVC;s but she said her palpitaitons were not active that day. Can have lightheadedness and chest pressure with palpitaoitns. They last a minute or two. No resting pain. Some dyspnea with exertion. 12 year survivor of lung cancer. Got chemo and radiation and use to smoke "like a freight train". No history of CAD. No resting SSCP. Compliant with meds Thinks her cozaar has made palpitations worse the last month. Intolerant to statins. CRF HTN cholesterol and previous smoking  myovue 5/13 study is abnormal. The patient has shortness of breath with no chest pain. There were significant EKG changes with 1-2mm downsloping ST depression with stress. There is scar and ischemia in the apical aspect of the inferolateral wall and the apical anterolateral wall. This is also present at the apical cap.  LV Ejection Fraction: 60%. LV Wall Motion: There is hypokinesis in several apical segments.  Echo 06/24/11  EF normal moderate AS  tudy Conclusions  - Left ventricle: The cavity size was normal. Wall thickness was normal. Systolic function was normal. The estimated ejection fraction was in the range of 55% to 60%. Wall motion was normal; there were no regional wall motion abnormalities. Features are consistent with a pseudonormal left ventricular filling pattern, with concomitant abnormal relaxation and increased filling pressure (grade 2 diastolic dysfunction). - Aortic valve: Trileaflet; moderately calcified leaflets. There was moderate stenosis. Mild regurgitation. Mean gradient: 29mm Hg (S). Peak gradient: 46mm Hg (S). Valve area: 1.23cm^2(VTI). -  Mitral valve: Mild regurgitation. - Right ventricle: The cavity size was normal. Systolic function was normal. - Tricuspid valve: Peak RV-RA gradient: 38mm Hg (S). - Pulmonary arteries: PA peak pressure: 43mm Hg (S). - Inferior vena cava: The vessel was normal in size; the respirophasic diameter changes were in the normal range (= 50%); findings are consistent with normal central venous pressure. Impressions:  - Normal LV size and systolic function, EF 55-60%. Normal RV size and systolic function. Mild pulmonary hypertension. Moderate aortic stenosis, mild aortic insufficiency, mild mitral regurgitation.  Carotids with no disease on duplex  She postponed cath due to family issues.  Continues to have dyspnea and palpitations.  Willing to proceed with cath.   ROS: Denies fever, malais, weight loss, blurry vision, decreased visual acuity, cough, sputum, SOB, hemoptysis, pleuritic pain, palpitaitons, heartburn, abdominal pain, melena, lower extremity edema, claudication, or rash.  All other systems reviewed and negative  General: Affect appropriate Healthy:  appears stated age HEENT: normal Neck supple with no adenopathy JVP normal no bruits no thyromegaly Lungs clear with no wheezing and good diaphragmatic motion Heart:  S1/S2 AS murmur, no rub, gallop or click PMI normal Abdomen: benighn, BS positve, no tenderness, no AAA no bruit.  No HSM or HJR Distal pulses intact with no bruits No edema Neuro non-focal Skin warm and dry No muscular weakness   Current Outpatient Prescriptions  Medication Sig Dispense Refill  . aspirin 81 MG tablet Take 81 mg by mouth daily.      . levothyroxine (SYNTHROID, LEVOTHROID) 112 MCG tablet Take 112 mcg by mouth daily.      . losartan (COZAAR) 50 MG tablet Take 50 mg by   mouth daily.      . NOVOLOG 100 UNIT/ML injection As directed        Allergies  Statins  Electrocardiogram:  SR rate 85 PVC   Assessment and Plan  

## 2011-08-12 NOTE — Patient Instructions (Signed)
Your physician has requested that you have a cardiac catheterization. Cardiac catheterization is used to diagnose and/or treat various heart conditions. Doctors may recommend this procedure for a number of different reasons. The most common reason is to evaluate chest pain. Chest pain can be a symptom of coronary artery disease (CAD), and cardiac catheterization can show whether plaque is narrowing or blocking your heart's arteries. This procedure is also used to evaluate the valves, as well as measure the blood flow and oxygen levels in different parts of your heart. For further information please visit [email or web address] Please follow instruction sheet, as given. Your physician recommends that you continue on your current medications as directed. Please refer to the Current Medication list given to you today. Your physician recommends that you return for lab work in: TODAY BMET CBC INR  DX V72.81

## 2011-08-14 ENCOUNTER — Telehealth: Payer: Self-pay | Admitting: Cardiovascular Disease

## 2011-08-14 NOTE — Telephone Encounter (Signed)
PT LOCATED INSTRUCTIONS AND AFTER REVIEW HAD NO QUESTIONS  CATH IS SCHEDULED FOR  08-21-11 ./CY

## 2011-08-14 NOTE — Telephone Encounter (Signed)
Please return call to patient at [phone] regarding catherization procedure.

## 2011-08-21 ENCOUNTER — Encounter (HOSPITAL_BASED_OUTPATIENT_CLINIC_OR_DEPARTMENT_OTHER)
Admission: RE | Disposition: A | Discharge status: Home / Self Care | Payer: Self-pay | Source: Ambulatory Visit | Attending: Cardiovascular Disease

## 2011-08-21 ENCOUNTER — Inpatient Hospital Stay (HOSPITAL_BASED_OUTPATIENT_CLINIC_OR_DEPARTMENT_OTHER)
Admission: RE | Admit: 2011-08-21 | Discharge: 2011-08-21 | Disposition: A | Discharge status: Home / Self Care | Payer: Medicare Other | Source: Ambulatory Visit | Attending: Cardiovascular Disease | Admitting: Cardiovascular Disease

## 2011-08-21 DIAGNOSIS — I2510 Atherosclerotic heart disease of native coronary artery without angina pectoris: Secondary | ICD-10-CM

## 2011-08-21 DIAGNOSIS — I359 Nonrheumatic aortic valve disorder, unspecified: Secondary | ICD-10-CM

## 2011-08-21 DIAGNOSIS — R0989 Other specified symptoms and signs involving the circulatory and respiratory systems: Secondary | ICD-10-CM | POA: Insufficient documentation

## 2011-08-21 DIAGNOSIS — R0609 Other forms of dyspnea: Secondary | ICD-10-CM | POA: Insufficient documentation

## 2011-08-21 LAB — POCT I-STAT 3, ART BLOOD GAS (G3+)
Bicarbonate: 21.7 mEq/L (ref 20.0–24.0)
TCO2: 23 mmol/L (ref 0–100)
pH, Arterial: 7.377 (ref 7.350–7.450)

## 2011-08-21 LAB — POCT I-STAT GLUCOSE: Glucose, Bld: 256 mg/dL — ABNORMAL HIGH (ref 70–99)

## 2011-08-21 LAB — POCT I-STAT 3, VENOUS BLOOD GAS (G3P V)
Acid-base deficit: 2 mmol/L (ref 0.0–2.0)
Bicarbonate: 23 mEq/L (ref 20.0–24.0)
O2 Saturation: 71 %

## 2011-08-21 SURGERY — JV LEFT AND RIGHT HEART CATHETERIZATION WITH CORONARY ANGIOGRAM
Anesthesia: Moderate Sedation

## 2011-08-21 MED ORDER — SODIUM CHLORIDE 0.9 % IJ SOLN: 3.0000 mL | INTRAMUSCULAR | Status: DC | PRN

## 2011-08-21 MED ORDER — SODIUM CHLORIDE 0.9 % IJ SOLN: 3.0000 mL | Freq: Two times a day (BID) | INTRAMUSCULAR | Status: DC

## 2011-08-21 MED ORDER — SODIUM CHLORIDE 0.45 % IV SOLN: INTRAVENOUS | Status: AC

## 2011-08-21 MED ORDER — ASPIRIN 81 MG PO CHEW: 324.0000 mg | CHEWABLE_TABLET | ORAL | Status: DC

## 2011-08-21 MED ORDER — SODIUM CHLORIDE 0.9 % IV SOLN
250.0000 mL | INTRAVENOUS | Status: DC | PRN
  Administered 2011-08-21: 250 mL via INTRAVENOUS

## 2011-08-21 MED ORDER — ONDANSETRON HCL 4 MG/2ML IJ SOLN: 4.0000 mg | Freq: Four times a day (QID) | INTRAMUSCULAR | Status: DC | PRN

## 2011-08-21 MED ORDER — SODIUM CHLORIDE 0.9 % IV SOLN: 250.0000 mL | INTRAVENOUS | Status: DC | PRN

## 2011-08-21 MED ORDER — ASPIRIN EC 325 MG PO TBEC: 325.0000 mg | DELAYED_RELEASE_TABLET | Freq: Every day | ORAL | Status: DC

## 2011-08-21 MED ORDER — OXYCODONE-ACETAMINOPHEN 5-325 MG PO TABS: 1.0000 | ORAL_TABLET | ORAL | Status: DC | PRN

## 2011-08-21 MED ORDER — SODIUM CHLORIDE 0.9 % IV SOLN: 250.0000 mL | INTRAVENOUS | Status: DC

## 2011-08-21 MED ORDER — ACETAMINOPHEN 325 MG PO TABS: 650.0000 mg | ORAL_TABLET | ORAL | Status: DC | PRN

## 2011-08-21 NOTE — CV Procedure (Signed)
      Catheterization   Indication: Markedly abnormal stress test with AS  Procedure: After informed consent and clinical "time out" the right groin was prepped and draped in a sterile fashion.  A 5Fr sheath was placed in the right femoral artery using seldinger technique and local lidocaine.  Standard JL4, JR4 and angled pigtail catheters were used to engage the coronary arteries.  Coronary arteries were visualized in orthogonal views using caudal and cranial angulation.  RAO ventriculography was done using 28* cc of contrast.  LAO aortography was done using 30cc of contrast  Medications:   Versed: 2 mg's  Fentanyl: 25 ug's  Coronary Arteries: Right dominant with no anomalies  LM: 70%  Diffuse with caliber of LM much smaller than proximal LAD.    LAD: Calcified  60% distal discrete stenosis    D1: 80% mid vessel stenosis  Circumflex: calcified primarily OM only no significant disease   OM1: normal   RCA: 30% proximal  Heavy calcification of proximal and mid vessel  70% mid    PDA: 30-40% mid  PLA: normal  Ventriculography: EF: 55 %,  Apical hypokinesis  Hemodynamics:  Aortic Pressure: 175 83 mmHg  LV Pressure: 201 14 15  MmHg  Right Heart Catheterization:  Indication: AS  Procedure:  Standard right heart catheterization was done from the right femoral vein using a 7Fr sheath and balloon flotation catheter.    Mean RA: 5  mmHg RV: 31/4  mmHg PA: 29/10  mmHg with a mean of 18  mmHg PCWP: 10  mmHg  Fick Cardiac Output:  6.4 L/minute,  3.3  L/minute/M2  Aortic Valve mean gradient 19 mmHg Peak to Peak gradient 26 mmHg [place]ki AVA:  .8 cm2  Impression:  Films reviewed with Dr Alice Reichert.  LM is significant and diffuse.  ECG on stress test had 2mm ST legment depression and catheter damping.   Echo shows mean gradient of 29 mmH and peak of 46 mmHg.  Best Rx will be CABG with AVR.  Called Dr Tyrone Sage who is in OR to see patient in recovery She is stable for D/C but  hopefully can arrange surgery for next week.    Charlton Haws 08/21/2011 12:36 PM

## 2011-08-21 NOTE — Interval H&P Note (Signed)
History and Physical Interval Note:  08/21/2011 10:47 AM  Jaclyn [name]  has presented today for surgery, with the diagnosis of cp  The various methods of treatment have been discussed with the patient and family. After consideration of risks, benefits and other options for treatment, the patient has consented to  Procedure(s) (LRB): JV LEFT AND RIGHT HEART CATHETERIZATION WITH CORONARY ANGIOGRAM (N/A) as a surgical intervention .  The patient's history has been reviewed, patient examined, no change in status, stable for surgery.  I have reviewed the patients' chart and labs.  Questions were answered to the patient's satisfaction.     Charlton Haws

## 2011-08-21 NOTE — OR Nursing (Signed)
Tegaderm dressing applied, site level 0, bedrest begins at 1300 

## 2011-08-21 NOTE — H&P (View-Only) (Signed)
Patient ID: Jaclyn [name], female   DOB: 12-04-1945, 66 y.o.   MRN: [id number] 66 yo referred by Dr Erenest Blank in [place]. She has been having palpitations with presyncope and SSCP. Reviewed records including office notes, labs and holter monitor. She was told she had MVP many years ago. She had only a 24 hours holter with occasional PVC;s but she said her palpitaitons were not active that day. Can have lightheadedness and chest pressure with palpitaoitns. They last a minute or two. No resting pain. Some dyspnea with exertion. 12 year survivor of lung cancer. Got chemo and radiation and use to smoke "like a freight train". No history of CAD. No resting SSCP. Compliant with meds Thinks her cozaar has made palpitations worse the last month. Intolerant to statins. CRF HTN cholesterol and previous smoking  myovue 5/13 study is abnormal. The patient has shortness of breath with no chest pain. There were significant EKG changes with 1-78mm downsloping ST depression with stress. There is scar and ischemia in the apical aspect of the inferolateral wall and the apical anterolateral wall. This is also present at the apical cap.  LV Ejection Fraction: 60%. LV Wall Motion: There is hypokinesis in several apical segments.  Echo 06/24/11  EF normal moderate AS  tudy Conclusions  - Left ventricle: The cavity size was normal. Wall thickness was normal. Systolic function was normal. The estimated ejection fraction was in the range of 55% to 60%. Wall motion was normal; there were no regional wall motion abnormalities. Features are consistent with a pseudonormal left ventricular filling pattern, with concomitant abnormal relaxation and increased filling pressure (grade 2 diastolic dysfunction). - Aortic valve: Trileaflet; moderately calcified leaflets. There was moderate stenosis. Mild regurgitation. Mean gradient: 29mm Hg (S). Peak gradient: 46mm Hg (S). Valve area: 1.23cm^2(VTI). -  Mitral valve: Mild regurgitation. - Right ventricle: The cavity size was normal. Systolic function was normal. - Tricuspid valve: Peak RV-RA gradient: 38mm Hg (S). - Pulmonary arteries: PA peak pressure: 43mm Hg (S). - Inferior vena cava: The vessel was normal in size; the respirophasic diameter changes were in the normal range (= 50%); findings are consistent with normal central venous pressure. Impressions:  - Normal LV size and systolic function, EF 55-60%. Normal RV size and systolic function. Mild pulmonary hypertension. Moderate aortic stenosis, mild aortic insufficiency, mild mitral regurgitation.  Carotids with no disease on duplex  She postponed cath due to family issues.  Continues to have dyspnea and palpitations.  Willing to proceed with cath.   ROS: Denies fever, malais, weight loss, blurry vision, decreased visual acuity, cough, sputum, SOB, hemoptysis, pleuritic pain, palpitaitons, heartburn, abdominal pain, melena, lower extremity edema, claudication, or rash.  All other systems reviewed and negative  General: Affect appropriate Healthy:  appears stated age HEENT: normal Neck supple with no adenopathy JVP normal no bruits no thyromegaly Lungs clear with no wheezing and good diaphragmatic motion Heart:  S1/S2 AS murmur, no rub, gallop or click PMI normal Abdomen: benighn, BS positve, no tenderness, no AAA no bruit.  No HSM or HJR Distal pulses intact with no bruits No edema Neuro non-focal Skin warm and dry No muscular weakness   Current Outpatient Prescriptions  Medication Sig Dispense Refill  . aspirin 81 MG tablet Take 81 mg by mouth daily.      Marland Kitchen levothyroxine (SYNTHROID, LEVOTHROID) 112 MCG tablet Take 112 mcg by mouth daily.      Marland Kitchen losartan (COZAAR) 50 MG tablet Take 50 mg by  mouth daily.      Marland Kitchen NOVOLOG 100 UNIT/ML injection As directed        Allergies  Statins  Electrocardiogram:  SR rate 85 PVC   Assessment and Plan

## 2011-08-22 ENCOUNTER — Other Ambulatory Visit: Payer: Self-pay | Admitting: Thoracic Surgery (Cardiothoracic Vascular Surgery)

## 2011-08-22 ENCOUNTER — Encounter: Payer: Self-pay | Admitting: Thoracic Surgery (Cardiothoracic Vascular Surgery)

## 2011-08-22 ENCOUNTER — Institutional Professional Consult (permissible substitution) (INDEPENDENT_AMBULATORY_CARE_PROVIDER_SITE_OTHER): Payer: Medicare Other | Admitting: Thoracic Surgery (Cardiothoracic Vascular Surgery)

## 2011-08-22 VITALS — BP 149/66 | HR 82 | Resp 16 | Ht 67.0 in | Wt 183.0 lb

## 2011-08-22 DIAGNOSIS — I359 Nonrheumatic aortic valve disorder, unspecified: Secondary | ICD-10-CM

## 2011-08-22 DIAGNOSIS — I2510 Atherosclerotic heart disease of native coronary artery without angina pectoris: Secondary | ICD-10-CM

## 2011-08-22 DIAGNOSIS — Z923 Personal history of irradiation: Secondary | ICD-10-CM | POA: Insufficient documentation

## 2011-08-22 DIAGNOSIS — I350 Nonrheumatic aortic (valve) stenosis: Secondary | ICD-10-CM

## 2011-08-22 DIAGNOSIS — C3490 Malignant neoplasm of unspecified part of unspecified bronchus or lung: Secondary | ICD-10-CM

## 2011-08-22 HISTORY — DX: Nonrheumatic aortic (valve) stenosis: I35.0

## 2011-08-22 HISTORY — DX: Atherosclerotic heart disease of native coronary artery without angina pectoris: I25.10

## 2011-08-22 NOTE — Progress Notes (Signed)
[address]            [name] [address]          [phone]     CARDIOTHORACIC SURGERY CONSULTATION REPORT  Referring Provider is Jaclyn Stade, MD PCP is Jaclyn Matar, MD  Chief Complaint  Patient presents with  . Coronary Artery Disease    eval and treat...cathed 08/21/11, echo 06/20/11  . Aortic Stenosis    HPI:  Patient is a 66 year old female from [place], [place] referred for evaluation of newly diagnosed aortic stenosis and coronary artery disease.  The patient has multiple medical problems including history of hypertension, hyperlipidemia, long-standing type 1 diabetes mellitus, previous history of tobacco abuse and lung cancer. In the past the patient was told that she had a heart murmur and this was attributed to mitral valve prolapse. The patient also is not certain whether or not she may have had rheumatic fever during her youth.  Her brother had rheumatic fever. The patient recently was referred for cardiac evaluation do to the development of frequent palpitations or dizzy spells, atypical chest pain, and exertional shortness of breath. The patient states that she has had mild exertional shortness of breath dating back to her history of the heavy tobacco abuse and lung cancer. She was diagnosed with small cell carcinoma of the lung approximately 12-14 years ago.  This was treated with chemo and radiation therapy.  She quit smoking at that time.  Since then she has remained stable until over the last year or so the patient has developed somewhat more noticeable exertional shortness of breath and progressive fatigue. The patient has also developed frequent tachypalpitations and some atypical episodes of chest pressure do not seem to be related to physical activity. She has developed significant fatigue and notes that she gets tired much more easily than she used to. She has had several dizzy spells without syncope. She was evaluated by  Dr. Eden [name] and underwent 2-D echocardiogram confirming the presence of moderate to severe aortic stenosis with normal left ventricular systolic function.  Stress myoview was performed demonstrating stress-induced EKG changes and ischemia on nuclear imaging.  Elective left and right heart catheterization performed yesterday confirmed the presence of severe aortic stenosis and also documented the presence of severe left main disease and three-vessel coronary artery disease. The patient has been referred for elective surgical consultation.  The patient reports moderate exertional shortness of breath. She gets short of breath quite easily if she walks up a flight of stairs or tries to do something strenuous. She does not get short of breath with normal activity around the house. She specifically denies resting shortness of breath, PND, orthopnea. She has mild chronic a lateral lower extremity edema. She has frequent tachypalpitations associated with some atypical chest discomfort. This does not seem to be related to physical activity. She has intermittent dizzy spells without syncope.  Past Medical History  Diagnosis Date  . Palpitation   . HTN (hypertension)   . Anemia   . Allergic rhinitis   . Dermatitis   . Fibrocystic disease of breast   . Callus   . Reflux esophagitis   . Corns   . Depression   . Dizziness   . Fatigue   . Foot pain   . Murmur   . Gastroparesis   . Hematuria   . Hyperlipidemia   . Hypothyroidism   .  Joint pain   . Menopause   . Microalbuminuria   . Myalgia   . Osteoporosis   . Plantar fasciitis   . Keratosis   . SOB (shortness of breath)   . Vitamin d deficiency   . Chest pain   . Orthostatic hypotension   . Peripheral neuropathy   . DM (diabetes mellitus)     type I - treated with insulin pump  . 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 Radiation fibrosis of lung   . Lung cancer 06/13/2011    Small Cell carcinoma of the  lung treated with chemo + radiation therapy to mediastinum and chest   . Aortic stenosis, severe 08/22/2011  . Coronary artery disease 08/21/2011    Cath 08/21/2011  . S/P radiation therapy 08/22/2011    Radiation therapy to chest and mediastinum for small cell carcinoma of the lung    Past Surgical History  Procedure Date  . Thyroidectomy     partial  . Total abdominal hysterectomy   . Shoulder surgery     bilateral  . Elbow surgery     bilateral  . Carpal tunnel release     bilateral    Family History  Problem Relation Age of Onset  . Alzheimer's disease    . Anemia    . Cancer    . Cirrhosis    . Colon cancer    . Coronary artery disease    . Heart attack    . Heart disease      Social History History  Substance Use Topics  . Smoking status: Former Smoker    Types: Cigarettes  . Smokeless tobacco: Never Used  . Alcohol Use: No    Current Outpatient Prescriptions  Medication Sig Dispense Refill  . aspirin 325 MG EC tablet Take 325 mg by mouth daily.      Marland Kitchen levothyroxine (SYNTHROID, LEVOTHROID) 112 MCG tablet Take 112 mcg by mouth daily.      Marland Kitchen losartan (COZAAR) 50 MG tablet Take 50 mg by mouth daily.      . magnesium hydroxide (MILK OF MAGNESIA) 400 MG/5ML suspension Take by mouth daily.      Marland Kitchen NOVOLOG 100 UNIT/ML injection As directed       No current facility-administered medications for this visit.   Facility-Administered Medications Ordered in Other Visits  Medication Dose Route Frequency Provider Last Rate Last Dose  . 0.45 % sodium chloride infusion   Intravenous Continuous Jaclyn Stade, MD      . DISCONTD: 0.9 %  sodium chloride infusion  250 mL Intravenous PRN Jaclyn Stade, MD 10 mL/hr at 08/21/11 1030 250 mL at 08/21/11 1030  . DISCONTD: 0.9 %  sodium chloride infusion  250 mL Intravenous Continuous Jaclyn Stade, MD      . DISCONTD: acetaminophen (TYLENOL) tablet 650 mg  650 mg Oral Q4H PRN Jaclyn Stade, MD      . DISCONTD: aspirin chewable  tablet 324 mg  324 mg Oral Pre-Cath Jaclyn Stade, MD      . DISCONTD: aspirin EC tablet 325 mg  325 mg Oral Daily Jaclyn Stade, MD      . DISCONTD: ondansetron (ZOFRAN) injection 4 mg  4 mg Intravenous Q6H PRN Jaclyn Stade, MD      . DISCONTD: oxyCODONE-acetaminophen (PERCOCET) 5-325 MG per tablet 1-2 tablet  1-2 tablet Oral Q4H PRN Jaclyn Stade, MD      .  DISCONTD: sodium chloride 0.9 % injection 3 mL  3 mL Intravenous Q12H Jaclyn Stade, MD      . DISCONTD: sodium chloride 0.9 % injection 3 mL  3 mL Intravenous PRN Jaclyn Stade, MD        Allergies  Allergen Reactions  . Statins     Joints ache...crestor...lipitor...crestor    Review of Systems:  General:  normal appetite, decreased energy, no weight gain/loss  Respiratory:  no cough, no wheezing, no hemoptysis, no pain with inspiration or cough, + stable exertional shortness of breath  Cardiac:  + atypcial chest pain, + exertional SOB, no resting SOB, no PND, no orthopnea, + LE edema, + palpitations, no syncope  GI:   no difficulty swallowing, no hematochezia, no hematemesis, no melena, + constipation, no diarrhea   GU:   no dysuria, no urgency, no frequency   Musculoskeletal: no arthritis, no arthralgia   Vascular:  no pain suggestive of claudication   Neuro:   no symptoms suggestive of TIA's, no seizures, + headaches, + peripheral neuropathy both lower legs  Endocrine:  Type I DM dx'd age 66 - uses insulin pump - has been having considerable difficulty with CBG management over the past month  HEENT:  Full set upper dentures, partial lower plate with 2 remaining teeth in poor condition - has been told they need to come out,  no recent vision changes  Psych:   no anxiety, + depression    Physical Exam:   BP 149/66  Pulse 82  Resp 16  Ht 5\' 7"  (1.702 m)  Wt 183 lb (83.008 kg)  BMI 28.66 kg/m2  SpO2 96%  General:  Mildly obese,  well-appearing  HEENT:  Unremarkable   Neck:   no JVD, no bruits, no adenopathy    Chest:   clear to auscultation, symmetrical breath sounds, no wheezes, no rhonchi   CV:   RRR, grade III/VI crescendo/decrescendo systolic murmur best LLSB   Abdomen:  soft, non-tender, no masses   Extremities:  warm, well-perfused, pulses not palpable, mild bilateral LE edema  Rectal/GU  Deferred  Neuro:   Grossly non-focal and symmetrical throughout  Skin:   Clean and dry, no rashes, no breakdown   Diagnostic Tests:  ------------------------------------------------------------ Transthoracic Echocardiography  Patient: Jaclyn [name], Jaclyn [name] MR #: [id number] Study Date: 06/20/2011 Gender: F Age: 35 Height: 170.2cm Weight: 83.5kg BSA: 1.4m^2 Pt. Status: Room:  ORDERING Gweneth Dimitri ATTENDING Marca Ancona, MD PERFORMING [facility], [facility] SONOGRAPHER Junious Dresser, RDCS cc:  ------------------------------------------------------------ LV EF: 55% - 60%  ------------------------------------------------------------ Indications: Aortic stenosis 424.1.  ------------------------------------------------------------ History: PMH: Murmur. Aortic stenosis. Chronic obstructive pulmonary disease. Risk factors: Diabetes mellitus. Dyslipidemia.  ------------------------------------------------------------ Study Conclusions  - Left ventricle: The cavity size was normal. Wall thickness was normal. Systolic function was normal. The estimated ejection fraction was in the range of 55% to 60%. Wall motion was normal; there were no regional wall motion abnormalities. Features are consistent with a pseudonormal left ventricular filling pattern, with concomitant abnormal relaxation and increased filling pressure (grade 2 diastolic dysfunction). - Aortic valve: Trileaflet; moderately calcified leaflets. There was moderate stenosis. Mild regurgitation. Mean gradient: 29mm Hg (S). Peak gradient: 46mm Hg (S). Valve area: 1.23cm^2(VTI). - Mitral valve: Mild  regurgitation. - Right ventricle: The cavity size was normal. Systolic function was normal. - Tricuspid valve: Peak RV-RA gradient: 38mm Hg (S). - Pulmonary arteries: PA peak pressure: 43mm Hg (S). - Inferior vena cava: The vessel was normal in size;  the respirophasic diameter changes were in the normal range (= 50%); findings are consistent with normal central venous pressure. Impressions:  - Normal LV size and systolic function, EF 55-60%. Normal RV size and systolic function. Mild pulmonary hypertension. Moderate aortic stenosis, mild aortic insufficiency, mild mitral regurgitation. Transthoracic echocardiography. M-mode, complete 2D, spectral Doppler, and color Doppler. Height: Height: 170.2cm. Height: 67in. Weight: Weight: 83.5kg. Weight: 183.6lb. Body mass index: BMI: 28.8kg/m^2. Body surface area: BSA: 1.110m^2. Blood pressure: 120/75. Patient status: Outpatient. Location: [facility]  ------------------------------------------------------------  ------------------------------------------------------------ Left ventricle: The cavity size was normal. Wall thickness was normal. Systolic function was normal. The estimated ejection fraction was in the range of 55% to 60%. Wall motion was normal; there were no regional wall motion abnormalities. Features are consistent with a pseudonormal left ventricular filling pattern, with concomitant abnormal relaxation and increased filling pressure (grade 2 diastolic dysfunction).  ------------------------------------------------------------ Aortic valve: Trileaflet; moderately calcified leaflets. Doppler: There was moderate stenosis. Mild regurgitation. VTI ratio of LVOT to aortic valve: 0.39. Valve area: 1.23cm^2(VTI). Indexed valve area: 0.63cm^2/m^2 (VTI). Peak velocity ratio of LVOT to aortic valve: 0.36. Valve area: 1.12cm^2 (Vmax). Indexed valve area: 0.57cm^2/m^2 (Vmax). Mean gradient: 29mm Hg (S). Peak gradient: 46mm  Hg (S).  ------------------------------------------------------------ Aorta: Aortic root: The aortic root was normal in size. Ascending aorta: The ascending aorta was normal in size.  ------------------------------------------------------------ Mitral valve: Doppler: There was no evidence for stenosis. Mild regurgitation. Peak gradient: 5mm Hg (D).  ------------------------------------------------------------ Left atrium: The atrium was normal in size.  ------------------------------------------------------------ Right ventricle: The cavity size was normal. Systolic function was normal.  ------------------------------------------------------------ Pulmonic valve: Structurally normal valve. Cusp separation was normal. Doppler: Transvalvular velocity was within the normal range. No regurgitation.  ------------------------------------------------------------ Tricuspid valve: Doppler: Trivial regurgitation.  ------------------------------------------------------------ Right atrium: The atrium was normal in size.  ------------------------------------------------------------ Pericardium: There was no pericardial effusion.  ------------------------------------------------------------ Systemic veins: Inferior vena cava: The vessel was normal in size; the respirophasic diameter changes were in the normal range (= 50%); findings are consistent with normal central venous pressure.  ------------------------------------------------------------  2D measurements Normal Doppler measurements Normal Left ventricle Main pulmonary LVID ED, 40.1 mm 43-52 artery chord, Pressure, 43 mm Hg =30 PLAX S LVID ES, 29 mm 23-38 Left ventricle chord, Ea, lat 7.9 cm/s ------ PLAX ann, tiss FS, chord, 28 % >29 DP PLAX E/Ea, lat 14.5 ------ LVPW, ED 8.95 mm ------ ann, tiss 6 IVS/LVPW 1.14 <1.3 DP ratio, ED Ea, med 7.35 cm/s ------ Ventricular septum ann, tiss IVS, ED 10.2 mm ------ DP LVOT  E/Ea, med 15.6 ------ Diam, S 20 mm ------ ann, tiss 5 Area 3.14 cm^2 ------ DP Diam 20 mm ------ LVOT Aorta Peak vel, 120 cm/s ------ Root diam, 32 mm ------ S ED VTI, S 33.2 cm ------ Left atrium Peak 6 mm Hg ------ AP dim 36 mm ------ gradient, AP dim 1.85 cm/m^2 <2.2 S index Stroke vol 104. ml ------ 3 Stroke 53.5 ml/m^2 ------ index Aortic valve Peak vel, 338 cm/s ------ S Mean vel, 258 cm/s ------ S VTI, S 84.5 cm ------ Mean 29 mm Hg ------ gradient, S Peak 46 mm Hg ------ gradient, S VTI ratio 0.39 ------ LVOT/AV Area, VTI 1.23 cm^2 ------ Area index 0.63 cm^2/m ------ (VTI) ^2 Peak vel 0.36 ------ ratio, LVOT/AV Area, Vmax 1.12 cm^2 ------ Area index 0.57 cm^2/m ------ (Vmax) ^2 Regurg PHT 350 ms ------ Mitral valve Peak E vel 115 cm/s ------ Peak A vel 98.2 cm/s ------ Decelerati 162 ms 150-23  on time 0 Peak 5 mm Hg ------ gradient, D Peak E/A 1.2 ------ ratio Tricuspid valve Regurg 308 cm/s ------ peak vel Peak RV-RA 38 mm Hg ------ gradient, S Systemic veins Estimated 5 mm Hg ------ CVP Right ventricle Sa vel, 14.3 cm/s ------ lat ann, tiss DP  ------------------------------------------------------------ Prepared and Electronically Authenticated by  Marca Ancona, MD [id number]  Cardiology Nuclear Med Study  Jaclyn [name] is a 66 y.o. female MRN : [id number] DOB: January 07, 1946  Procedure Date: 06/26/2011  Nuclear Med Background  Indication for Stress Test: Evaluation for Ischemia  History: H/O Chemo; ~15 yrs ago GXT:OK per patient; 06/20/11 EF=55-60%, moderate AS, mild MR  Cardiac Risk Factors: Family History - CAD, History of Smoking, Hypertension, IDDM Type 2 and Lipids  Symptoms: Difficult Historian; Chest/Neck Pain (last episode of chest discomfort was about one month ago), Dizziness, DOE, Near Syncope and Palpitations  Nuclear Pre-Procedure  Caffeine/Decaff Intake: None  NPO After: 7:30am   Lungs: Clear.  IV  0.9% NS with Angio Cath: 22g   IV Site: R Hand  IV Started by: Stanton Kidney, EMT-P   Chest Size (in): 42  Cup Size: C   Height: 5\' 7"  (1.702 m)  Weight: 185 lb (83.915 kg)   BMI: Body mass index is 28.97 kg/(m^2).  Tech Comments: CBG=333 @ 11:30am, per patient.   Nuclear Med Study  1 or 2 day study: 1 day  Stress Test Type: Stress   Reading MD: Willa Rough, MD  Order Authorizing Provider: Charlton Haws, MD   Resting Radionuclide: Technetium 65m Tetrofosmin  Resting Radionuclide Dose: 11.0 mCi   Stress Radionuclide: Technetium 67m Tetrofosmin  Stress Radionuclide Dose: 33.0 mCi   Stress Protocol  Rest HR: 78  Stress HR: 153   Rest BP: 179/64  Stress BP: 196/69   Exercise Time (min): 4:00  METS: 5.2   Predicted Max HR: 153 bpm  % Max HR: 100 bpm  Rate Pressure Product: 62130  Dose of Adenosine (mg): n/a  Dose of Lexiscan: n/a mg   Dose of Atropine (mg): n/a  Dose of Dobutamine: n/a mcg/kg/min (at max HR)   Stress Test Technologist: Smiley Houseman, CMA-N  Nuclear Technologist: Domenic Polite, CNMT   Rest Procedure: Myocardial perfusion imaging was performed at rest 45 minutes following the intravenous administration of Technetium 3m Tetrofosmin.  Rest ECG: No acute changes with occasional PVC's.  Stress Procedure: The patient exercised on the treadmill utilizing the Bruce protocol for four minutes. She then stopped due to significant dyspnea with O2 SAT of 95% and marked ST-T wave changes that continued late into recovery. She denied any chest pain. Technetium 63m Tetrofosmin was injected at peak exercise and myocardial perfusion imaging was performed after a brief delay.  Images and EKG's were discussed with Dr. Johney Frame (DOD) and Dr. Myrtis Ser (Reader).  Stress ECG: The patient walked on the treadmill for 4 minutes. She developed significant shortness of breath. There was no significant chest pain. She develops significant EKG changes with1-64mm downsloping ST depression. There were no significant  arrhythmias.  QPS  Raw Data Images: Normal; no motion artifact; normal heart/lung ratio.  Stress Images: There is moderately severe decreased uptake in a moderate area of affecting the apical inferolateral segment, apical anterolateral segment and the apical cap.  Rest Images: There is mild improvement in the defects noted on the stress images.  Subtraction (SDS): There is mild ischemia in the apical inferolateral wall anterolateral wall and apical cap.  Transient Ischemic Dilatation (Normal <1.22): 1.06  Lung/Heart Ratio (Normal <0.45): 0.32  Quantitative Gated Spect Images  QGS EDV: 98 ml  QGS ESV: 39 ml  Impression  Exercise Capacity: Fair exercise capacity.  BP Response: Normal blood pressure response.  Clinical Symptoms: significant shortness of breath  ECG Impression: Significant EKG changes as outlined above.  Comparison with Prior Nuclear Study: No previous nuclear study performed  Overall Impression: The study is abnormal. The patient has shortness of breath with no chest pain. There were significant EKG changes with 1-22mm downsloping ST depression with stress. There is scar and ischemia in the apical aspect of the inferolateral wall and the apical anterolateral wall. This is also present at the apical cap.  LV Ejection Fraction: 60%. LV Wall Motion: There is hypokinesis in several apical segments.  Willa Rough, MD       Catheterization  Indication: Markedly abnormal stress test with AS  Procedure: After informed consent and clinical "time out" the right groin was prepped and draped in a sterile fashion. A 5Fr sheath was placed in the right femoral artery using seldinger technique and local lidocaine. Standard JL4, JR4 and angled pigtail catheters were used to engage the coronary arteries. Coronary arteries were visualized in orthogonal views using caudal and cranial angulation. RAO ventriculography was done using 28* cc of contrast. LAO aortography was done using 30cc of  contrast  Medications:  Versed: 2 mg's Fentanyl: 25 ug's  Coronary Arteries:  Right dominant with no anomalies  LM: 70% Diffuse with caliber of LM much smaller than proximal LAD.   LAD: Calcified 60% distal discrete stenosis  D1: 80% mid vessel stenosis  Circumflex: calcified primarily OM only no significant disease  OM1: normal  RCA: 30% proximal Heavy calcification of proximal and mid vessel 70% mid  PDA: 30-40% mid  PLA: normal  Ventriculography: EF: 55 %, Apical hypokinesis  Hemodynamics:  Aortic Pressure: 175 83 mmHg LV Pressure: 201 14 15 MmHg  Right Heart Catheterization:  Indication: AS  Procedure: Standard right heart catheterization was done from the right femoral vein using a 7Fr sheath and balloon flotation catheter.  Mean RA: 5 mmHg  RV: 31/4 mmHg  PA: 29/10 mmHg with a mean of 18 mmHg  PCWP: 10 mmHg  Fick Cardiac Output: 6.4 L/minute, 3.3 L/minute/M2  Aortic Valve mean gradient 19 mmHg Peak to Peak gradient 26 mmHg  Hakki AVA: .8 cm2  Impression: Films reviewed with Dr Alice Reichert. LM is significant and diffuse. ECG on stress test had 2mm ST legment depression and catheter damping.  Echo shows mean gradient of 29 mmH and peak of 46 mmHg. Best Rx will be CABG with AVR. Called Dr Tyrone Sage who is in OR to see patient in recovery  She is stable for D/C but hopefully can arrange surgery for next week.  Charlton Haws  08/21/2011  12:36 PM        Impression:  I've directly reviewed the patient's recent echocardiogram and cardiac catheterization. The patient clearly has severe symptomatic aortic stenosis with preserved left ventricular function. The aortic valve appears tricuspid but it is clearly stenotic. The left ventricular outflow tract is relatively small, measuring only 2.0 cm in diameter. The etiology of aortic valve disease could be simple calcific aortic stenosis, although the patient may have history of rheumatic heart disease and she also underwent radiation  therapy to the mediastinum for treatment of lung cancer. There is no mitral valve prolapse nor mitral regurgitation.  There is probably 60-70% stenosis of the left main coronary artery with significant  three-vessel coronary artery disease. There is somewhat diffuse calcification and disease involving all of the coronary arteries proximally which is not surprising given the patient's long-standing history of diabetes mellitus.  There is right dominant coronary circulation. The left circumflex system is relatively small giving rise to one relatively small obtuse marginal branch that may not be large enough for grafting at the time of surgery. From a technical standpoint the risks of surgery may be increased substantially do to the patient's history of previous radiation therapy.  Perioperative risks will also be somewhat increased do to the patient's numerous comorbid medical problems as noted previously. However, there is no question that the patient's only reasonable long-term treatment option is aortic valve replacement with coronary artery bypass grafting.   Plan:  I've reviewed the indications, risks, and potential benefits of surgery at length with the patient and her daughter here in the office today.  The patient was counseled at length regarding surgical alternatives with respect to valve replacement. In particular, discussion was held comparing in contrast and the risks of mechanical valve replacement and the need for lifelong anticoagulation versus use of a bioprosthetic tissue valve and the associated potential for late structural valve deterioration in failure.  This discussion was placed in the context of the patient's particular circumstances, and as a result the patient specifically requests that their valve be replaced using a bioprosthetic tissue valve.  The need for coronary artery bypass grafting was also explained in detail. The potential for increased risk related to the patient's previous  history of lung cancer with radiation therapy to the chest and mediastinum were discussed.  They understand and accept all potential associated risks of surgery including but not limited to risk of death, stroke, myocardial infarction, congestive heart failure, respiratory failure, renal failure, pneumonia, bleeding requiring blood transfusion and or reexploration, arrhythmia, heart block or bradycardia requiring permanent pacemaker, pleural effusions or other delayed complications related to continued congestive heart failure, other late complications related to valve replacement, or late recurrence of symptomatic coronary artery disease.  We will obtain followup chest CT scan given the patient's history of lung cancer in the distant past. Also obtain pulmonary function tests and followup records from the patient's previous lung cancer treatment. We will send her for dental consult for possible need of extraction of her remaining teeth. Finally, we will try to get her back into her endocrinologist's office to evaluate why she has been having some much trouble managing her blood sugars over the past month. Will plan to see her back in 2 weeks and possibly schedule surgery at that time.     Salvatore Decent. Cornelius Moras, MD 08/22/2011 11:06 AM

## 2011-08-26 ENCOUNTER — Encounter (HOSPITAL_COMMUNITY): Payer: Self-pay | Admitting: Dentistry

## 2011-08-26 ENCOUNTER — Ambulatory Visit (HOSPITAL_COMMUNITY): Payer: Self-pay | Admitting: Dentistry

## 2011-08-26 VITALS — BP 175/71 | HR 84 | Temp 97.9°F

## 2011-08-26 DIAGNOSIS — K0889 Other specified disorders of teeth and supporting structures: Secondary | ICD-10-CM

## 2011-08-26 DIAGNOSIS — K08109 Complete loss of teeth, unspecified cause, unspecified class: Secondary | ICD-10-CM

## 2011-08-26 DIAGNOSIS — Z0189 Encounter for other specified special examinations: Secondary | ICD-10-CM

## 2011-08-26 DIAGNOSIS — K036 Deposits [accretions] on teeth: Secondary | ICD-10-CM

## 2011-08-26 DIAGNOSIS — K121 Other forms of stomatitis: Secondary | ICD-10-CM

## 2011-08-26 DIAGNOSIS — M264 Malocclusion, unspecified: Secondary | ICD-10-CM

## 2011-08-26 DIAGNOSIS — I2510 Atherosclerotic heart disease of native coronary artery without angina pectoris: Secondary | ICD-10-CM

## 2011-08-26 DIAGNOSIS — K0530 Chronic periodontitis, unspecified: Secondary | ICD-10-CM | POA: Insufficient documentation

## 2011-08-26 DIAGNOSIS — K0820 Unspecified atrophy of edentulous alveolar ridge: Secondary | ICD-10-CM

## 2011-08-26 DIAGNOSIS — Z954 Presence of other heart-valve replacement: Secondary | ICD-10-CM

## 2011-08-26 DIAGNOSIS — I359 Nonrheumatic aortic valve disorder, unspecified: Secondary | ICD-10-CM

## 2011-08-26 MED ORDER — CHLORHEXIDINE GLUCONATE 0.12 % MT SOLN: OROMUCOSAL | Status: DC

## 2011-08-26 NOTE — Progress Notes (Signed)
DENTAL CONSULTATION  Date of Consultation:  08/26/2011 Patient Name:   Jaclyn [name] Date of Birth:   1945/04/13 Medical Record Number: [id number]  VITALS: BP 175/71  Pulse 84  Temp 97.9 F (36.6 C)   CHIEF COMPLAINT: Patient was referred for a pre-heart valve surgery dental protocol evaluation.  HPI: Jaclyn [name]  is a 66 year old female recently diagnosed with severe aortic stenosis and coronary artery disease. Patient with anticipated aortic valve replacement along with 3 vessel coronary artery bypass graft procedure with Dr. Tressie Stalker. Patient is now seen as part of a pre-heart valve surgery dental protocol evaluation to rule out dental infection that may affect the patient's systemic health and anticipated heart valve surgery.  Patient currently denies acute toothache, swellings, or abscesses. Patient was last seen 6-10 years ago for an exam and cleaning. Patient indictes that the upper denture was made approximately 25 years ago. Patient indicates the lower partial denture was made approximately 20 years ago. These were fabricated by Dr. Clarene Duke and [place], [place]. Patient currently indicates that the upper denture" fits really good". Patient indicates the lower partial denture " don't fit good". Patient also indicates that the" bottom teeth are in bad shape". Patient is interested in having remaining teeth extracted at this time.  PMH: Past Medical History  Diagnosis Date  . Palpitation   . HTN (hypertension)   . Anemia   . Allergic rhinitis   . Dermatitis   . Fibrocystic disease of breast   . Callus   . Reflux esophagitis   . Corns   . Depression   . Dizziness   . Fatigue   . Foot pain   . Murmur   . Gastroparesis   . Hematuria   . Hyperlipidemia   . Hypothyroidism   . Joint pain   . Menopause   . Microalbuminuria   . Myalgia   . Osteoporosis   . Plantar fasciitis   . Keratosis   . SOB (shortness of breath)   . Vitamin d deficiency    . Chest pain   . Orthostatic hypotension   . Peripheral neuropathy   . DM (diabetes mellitus)     type I - treated with insulin pump  . 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 Radiation fibrosis of lung   . 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 Aortic stenosis, severe 08/22/2011  . Coronary artery disease 08/21/2011    Cath 08/21/2011  . S/P radiation therapy 08/22/2011    Radiation therapy to chest and mediastinum for small cell carcinoma of the lung    PSH: Past Surgical History  Procedure Date  . Thyroidectomy     partial  . Total abdominal hysterectomy   . Shoulder surgery     bilateral  . Elbow surgery     bilateral  . Carpal tunnel release     bilateral    ALLERGIES: Allergies  Allergen Reactions  . Statins     Joints ache...crestor...lipitor...crestor    MEDICATIONS: Current Outpatient Prescriptions  Medication Sig Dispense Refill  . aspirin 325 MG EC tablet Take 325 mg by mouth daily.      . cholecalciferol (VITAMIN D) 1000 UNITS tablet Take 1,000 Units by mouth daily.      Marland Kitchen levothyroxine (SYNTHROID, LEVOTHROID) 112 MCG tablet Take 112 mcg by  mouth daily.      Marland Kitchen losartan (COZAAR) 50 MG tablet Take 50 mg by mouth daily.      . magnesium hydroxide (MILK OF MAGNESIA) 400 MG/5ML suspension Take by mouth daily.      Marland Kitchen NOVOLOG 100 UNIT/ML injection As directed      . chlorhexidine (PERIDEX) 0.12 % solution Rinse with 15 mls twice daily for 30 seconds. Use after breakfast and at bedtime. Spit out excess. Do not swallow.  480 mL  6    LABS: Lab Results  Component Value Date   WBC 7.2 08/12/2011   HGB 12.7 08/12/2011   HCT 39.3 08/12/2011   MCV 85.1 08/12/2011   PLT 297.0 08/12/2011      Component Value Date/Time   NA 136 08/12/2011 1157   K 5.6* 08/12/2011 1157   CL 104 08/12/2011 1157   CO2 26 08/12/2011 1157   GLUCOSE 256* 08/21/2011 1209   BUN  21 08/12/2011 1157   CREATININE 1.1 08/12/2011 1157   CALCIUM 10.5 08/12/2011 1157   Lab Results  Component Value Date   INR 0.9 08/12/2011   No results found for this basename: PTT    SOCIAL HISTORY: History   Social History  . Marital Status: Divorced    Spouse Name: N/A    Number of Children: 2  . Years of Education: N/A   Occupational History  . Not on file.   Social History Main Topics  . Smoking status: Former Smoker -- 2.0 packs/day for 43 years    Types: Cigarettes  . Smokeless tobacco: Never Used  . Alcohol Use: No  . Drug Use: No  . Sexually Active: Not on file   Other Topics Concern  . Not on file   Social History Narrative   The patient is divorced.The patient has 2 daughters. Patient used to smoke between 1-2.5 packs per day for 43 years. Patient quit smoking 12 years ago approximately 2001 after her lung cancer diagnosis.    FAMILY HISTORY: Family History  Problem Relation Age of Onset  . Alzheimer's disease    . Anemia    . Cancer    . Cirrhosis    . Colon cancer    . Coronary artery disease    . Heart attack    . Heart disease       REVIEW OF SYSTEMS: Reviewed with patient and postive as above.  DENTAL HISTORY: CHIEF COMPLAINT: Patient was referred for a pre-heart valve surgery dental protocol evaluation.  HPI: Jaclyn [name]  is a 66 year old female recently diagnosed with severe aortic stenosis and coronary artery disease. Patient with anticipated aortic valve replacement along with 3 vessel coronary artery bypass graft procedure with Dr. Tressie Stalker. Patient is now seen as part of a pre-heart valve surgery dental protocol evaluation to rule out dental infection that may affect the patient's systemic health and anticipated heart valve surgery.  Patient currently denies acute toothache, swellings, or abscesses. Patient was last seen 6-10 years ago for an exam an patient is a well-developed, well-nourished female in no acute distressd  cleaning. Patient indictes that the upper denture was made approximately 25 years There scratch ago. Patient indicates the lower partial denture was made approximately 20 years ago. These were fabricated by Dr. Clarene Duke and [place], [place]. Patient currently indicates that the upper denture" fits really good". Patient indicates the lower partial denture " don't fit good". Patient also indicates that the" bottom teeth are in bad shape". Patient is  interested in having remaining teeth extracted at this time.   DENTAL EXAMINATION:  GENERAL: Patient is a well-developed, well-nourished female in no acute distress. HEAD AND NECK: There is no submandibular lymphadenopathy. The patient denies acute TMJ symptoms. INTRAORAL EXAM: The patient has normal saliva. There is no evidence of abscess formation. The patient has denture stomatitis underneath the upper denture. The patient has atrophy of the edentulous alveolar ridge on the mandibular arch. DENTITION: The patient is missing all remaining teeth with the exception of tooth numbers 22 and 27.  PERIODONTAL: The patient has chronic periodontitis with plaque and calculus accumulations, generalized gingival recession, and generalized tooth mobility. DENTAL CARIES/SUBOPTIMAL RESTORATIONS: The patient has generalized attrition of the tooth root surface but no obvious dental caries. ENDODONTIC: The patient denies acute TMJ symptoms. There is no evidence of periapical pathology at this time. CROWN AND BRIDGE: There are no crown restorations. PROSTHODONTIC: The patient has upper complete and lower cast partial dentures that are ill fitting.  The patient has denture stomatitis underneath the upper denture. The pateint was instructed to keep the denture out at bedtime. OCCLUSION: There is a poor occlusal scheme noted with the upper denture lower partial denture. The lower teeth have abraded the upper complete denture teeth. RADIOGRAPHIC INTERPRETATION: An  OrthoPantogram was taken on with 2 periapical radiographs. There are multiple missing teeth with exception of tooth numbers 22 and 27. There is severe bone loss. There is atrophy of the edentulous alveolar ridge. There is supra-eruption and drifting of the unopposed teeth into the edentulous areas.    ASSESSMENTS: 1. Chronic periodontitis with bone loss 2. Tooth mobility 3. Accretions 4. Gingival recession 5. Multiple missing teeth 6. Supra-eruption and drifting of the unopposed teeth into the edentulous areas 7. Denture stomatitis underneath the upper denture 8. Atrophy of the edentulous mandibular alveolar ridges 9. Ill-fitting maxillary complete and mandibular partial dentures 10. Poor occlusal scheme 11. Cardiac compromise with potential risk up to and including death with anticipated invasive dental procedures.    PLAN/RECOMMENDATIONS: 1. I discussed the risks, benefits, and complications of various treatment options with the patient in relationship to her medical and dental conditions and anticipated heart valve surgery. We discussed various treatment options to include no treatment, multiple extractions with alveoloplasty, pre-prosthetic surgery as indicated, periodontal therwithapy, dental restorations, root canal therapy, crown and bridge therapy, implant therapy, and replacement of missing teeth as indicated.  We also discussed possible referral to an oral surgeon if so desired. The patient currently wishes to proceed with  Extraction of remaining teeth with alveoloplasaty in the operating room on Thursday- August 29, 2011 at 10:45 am. Presurgical testing appointment is scheduled for Wednesday at 1:00PM thru [facility] Short stay. Upper and lower dentures to be fabricated after adequate healing by the dentist of her choice 6-8 weeks after the dental surgery. Patient was given a prescription for chlorhexidine rinse to assist in this infection of the oral cavity as well as to assist in treatment  of the denture stomatitis.   2. Discussion of findings with medical team and coordination of future medical and dental care.   Charlynne Pander, DDS

## 2011-08-26 NOTE — H&P (Signed)
08/26/2011  History and Physical provided by Dr. Lemont Fillers  for operating room dental procedures as below. Dr. Kristin Bruins  CARDIOTHORACIC SURGERY CONSULTATION REPORT   Referring Provider is Wendall Stade, MD PCP is Robb Matar, MD    Chief Complaint   Patient presents with   .  Coronary Artery Disease       eval and treat...cathed 08/21/11, echo 06/20/11   .  Aortic Stenosis        HPI:   Patient is a 66 year old female from [place], [place] referred for evaluation of newly diagnosed aortic stenosis and coronary artery disease.  The patient has multiple medical problems including history of hypertension, hyperlipidemia, long-standing type 1 diabetes mellitus, previous history of tobacco abuse and lung cancer. In the past the patient was told that she had a heart murmur and this was attributed to mitral valve prolapse. The patient also is not certain whether or not she may have had rheumatic fever during her youth.  Her brother had rheumatic fever. The patient recently was referred for cardiac evaluation do to the development of frequent palpitations or dizzy spells, atypical chest pain, and exertional shortness of breath. The patient states that she has had mild exertional shortness of breath dating back to her history of the heavy tobacco abuse and lung cancer. She was diagnosed with small cell carcinoma of the lung approximately 12-14 years ago.  This was treated with chemo and radiation therapy. She quit smoking at that time.  Since then she has remained stable until over the last year or so the patient has developed somewhat more noticeable exertional shortness of breath and progressive fatigue. The patient has also developed frequent tachypalpitations and some atypical episodes of chest pressure do not seem to be related to physical activity. She has developed significant fatigue and notes that she gets tired much more easily than she used to. She has had several dizzy  spells without syncope. She was evaluated by Dr. Eden Emms and underwent 2-D echocardiogram confirming the presence of moderate to severe aortic stenosis with normal left ventricular systolic function.  Stress myoview was performed demonstrating stress-induced EKG changes and ischemia on nuclear imaging.  Elective left and right heart catheterization performed yesterday confirmed the presence of severe aortic stenosis and also documented the presence of severe left main disease and three-vessel coronary artery disease. The patient has been referred for elective surgical consultation.   The patient reports moderate exertional shortness of breath. She gets short of breath quite easily if she walks up a flight of stairs or tries to do something strenuous. She does not get short of breath with normal activity around the house. She specifically denies resting shortness of breath, PND, orthopnea. She has mild chronic a lateral lower extremity edema. She has frequent tachypalpitations associated with some atypical chest discomfort. This does not seem to be related to physical activity. She has intermittent dizzy spells without syncope.    Past Medical History   Diagnosis  Date   .  Palpitation     .  HTN (hypertension)     .  Anemia     .  Allergic rhinitis     .  Dermatitis     .  Fibrocystic disease of breast     .  Callus     .  Reflux esophagitis     .  Corns     .  Depression     .  Dizziness     .  Fatigue     .  Foot pain     .  Murmur     .  Gastroparesis     .  Hematuria     .  Hyperlipidemia     .  Hypothyroidism     .  Joint pain     .  Menopause     .  Microalbuminuria     .  Myalgia     .  Osteoporosis     .  Plantar fasciitis     .  Keratosis     .  SOB (shortness of breath)     .  Vitamin d deficiency     .  Chest pain     .  Orthostatic hypotension     .  Peripheral neuropathy     .  DM (diabetes mellitus)         type I - treated with insulin pump   .  Carcinoma          Small Cell Carcinoma of the lung s/p chemo + radiation therapy   .  Radiation fibrosis of lung     .  Lung cancer  06/13/2011       Small Cell carcinoma of the lung treated with chemo + radiation therapy to mediastinum and chest    .  Aortic stenosis, severe  08/22/2011   .  Coronary artery disease  08/21/2011       Cath 08/21/2011   .  S/P radiation therapy  08/22/2011       Radiation therapy to chest and mediastinum for small cell carcinoma of the lung         Past Surgical History   Procedure  Date   .  Thyroidectomy         partial   .  Total abdominal hysterectomy     .  Shoulder surgery         bilateral   .  Elbow surgery         bilateral   .  Carpal tunnel release         bilateral         Family History   Problem  Relation  Age of Onset   .  Alzheimer's disease       .  Anemia       .  Cancer       .  Cirrhosis       .  Colon cancer       .  Coronary artery disease       .  Heart attack       .  Heart disease            Social History History   Substance Use Topics   .  Smoking status:  Former Smoker       Types:  Cigarettes   .  Smokeless tobacco:  Never Used   .  Alcohol Use:  No         Current Outpatient Prescriptions   Medication  Sig  Dispense  Refill   .  aspirin 325 MG EC tablet  Take 325 mg by mouth daily.         Marland Kitchen  levothyroxine (SYNTHROID, LEVOTHROID) 112 MCG tablet  Take 112 mcg by mouth daily.         Marland Kitchen  losartan (COZAAR) 50 MG tablet  Take 50 mg by mouth daily.         .  magnesium hydroxide (  MILK OF MAGNESIA) 400 MG/5ML suspension  Take by mouth daily.         Marland Kitchen  NOVOLOG 100 UNIT/ML injection  As directed             No current facility-administered medications for this visit.       Facility-Administered Medications Ordered in Other Visits   Medication  Dose  Route  Frequency  Provider  Last Rate  Last Dose   .  0.45 % sodium chloride infusion     Intravenous  Continuous  Wendall Stade, MD         .  DISCONTD: 0.9 %  sodium  chloride infusion   250 mL  Intravenous  PRN  Wendall Stade, MD  10 mL/hr at 08/21/11 1030  250 mL at 08/21/11 1030   .  DISCONTD: 0.9 %  sodium chloride infusion   250 mL  Intravenous  Continuous  Wendall Stade, MD         .  DISCONTD: acetaminophen (TYLENOL) tablet 650 mg   650 mg  Oral  Q4H PRN  Wendall Stade, MD         .  DISCONTD: aspirin chewable tablet 324 mg   324 mg  Oral  Pre-Cath  Wendall Stade, MD         .  DISCONTD: aspirin EC tablet 325 mg   325 mg  Oral  Daily  Wendall Stade, MD         .  DISCONTD: ondansetron (ZOFRAN) injection 4 mg   4 mg  Intravenous  Q6H PRN  Wendall Stade, MD         .  DISCONTD: oxyCODONE-acetaminophen (PERCOCET) 5-325 MG per tablet 1-2 tablet   1-2 tablet  Oral  Q4H PRN  Wendall Stade, MD         .  DISCONTD: sodium chloride 0.9 % injection 3 mL   3 mL  Intravenous  Q12H  Wendall Stade, MD         .  DISCONTD: sodium chloride 0.9 % injection 3 mL   3 mL  Intravenous  PRN  Wendall Stade, MD               Allergies   Allergen  Reactions   .  Statins         Joints ache...crestor...lipitor...crestor        Review of Systems:             General:                      normal appetite, decreased energy, no weight gain/loss             Respiratory:                no cough, no wheezing, no hemoptysis, no pain with inspiration or cough, + stable exertional shortness of breath             Cardiac:                      + atypcial chest pain, + exertional SOB, no resting SOB, no PND, no orthopnea, + LE edema, + palpitations, no syncope             GI:  no difficulty swallowing, no hematochezia, no hematemesis, no melena, + constipation, no diarrhea               GU:                              no dysuria, no urgency, no frequency               Musculoskeletal:         no arthritis, no arthralgia               Vascular:                     no pain suggestive of claudication               Neuro:                          no symptoms suggestive of TIA's, no seizures, + headaches, + peripheral neuropathy both lower legs             Endocrine:                   Type I DM dx'd age 35 - uses insulin pump - has been having considerable difficulty with CBG management over the past month             HEENT:                       Full set upper dentures, partial lower plate with 2 remaining teeth in poor condition - has been told they need to come out,  no recent vision changes             Psych:                         no anxiety, + depression                Physical Exam:               BP 149/66  Pulse 82  Resp 16  Ht 5\' 7"  (1.702 m)  Wt 183 lb (83.008 kg)  BMI 28.66 kg/m2  SpO2 96%             General:                      Mildly obese,  well-appearing             HEENT:                       Unremarkable               Neck:                           no JVD, no bruits, no adenopathy               Chest:                         clear to auscultation, symmetrical breath sounds, no wheezes, no rhonchi               CV:  RRR, grade III/VI crescendo/decrescendo systolic murmur best LLSB               Abdomen:                    soft, non-tender, no masses               Extremities:                 warm, well-perfused, pulses not palpable, mild bilateral LE edema             Rectal/GU                   Deferred             Neuro:                         Grossly non-focal and symmetrical throughout             Skin:                            Clean and dry, no rashes, no breakdown     Diagnostic Tests:   ------------------------------------------------------------ Transthoracic Echocardiography  Patient: Willine, Schwalbe MR #: [id number] Study Date: 06/20/2011 Gender: F Age: 18 Height: 170.2cm Weight: 83.5kg BSA: 1.54m^2 Pt. Status: Room:  ORDERING Gweneth Dimitri ATTENDING Marca Ancona, MD PERFORMING [facility], [facility] SONOGRAPHER Junious Dresser, RDCS cc:  ------------------------------------------------------------ LV EF: 55% - 60%  ------------------------------------------------------------ Indications: Aortic stenosis 424.1.  ------------------------------------------------------------ History: PMH: Murmur. Aortic stenosis. Chronic obstructive pulmonary disease. Risk factors: Diabetes mellitus. Dyslipidemia.  ------------------------------------------------------------ Study Conclusions  - Left ventricle: The cavity size was normal. Wall thickness was normal. Systolic function was normal. The estimated ejection fraction was in the range of 55% to 60%. Wall motion was normal; there were no regional wall motion abnormalities. Features are consistent with a pseudonormal left ventricular filling pattern, with concomitant abnormal relaxation and increased filling pressure (grade 2 diastolic dysfunction). - Aortic valve: Trileaflet; moderately calcified leaflets. There was moderate stenosis. Mild regurgitation. Mean gradient: 29mm Hg (S). Peak gradient: 46mm Hg (S). Valve area: 1.23cm^2(VTI). - Mitral valve: Mild regurgitation. - Right ventricle: The cavity size was normal. Systolic function was normal. - Tricuspid valve: Peak RV-RA gradient: 38mm Hg (S). - Pulmonary arteries: PA peak pressure: 43mm Hg (S). - Inferior vena cava: The vessel was normal in size; the respirophasic diameter changes were in the normal range (= 50%); findings are consistent with normal central venous pressure. Impressions:  - Normal LV size and systolic function, EF 55-60%. Normal RV size and systolic function. Mild pulmonary hypertension. Moderate aortic stenosis, mild aortic insufficiency, mild mitral regurgitation. Transthoracic echocardiography. M-mode, complete 2D, spectral Doppler, and color Doppler. Height: Height: 170.2cm. Height: 67in. Weight: Weight: 83.5kg. Weight: 183.6lb. Body mass index: BMI: 28.8kg/m^2. Body  surface area: BSA: 1.48m^2. Blood pressure: 120/75. Patient status: Outpatient. Location: [facility]  ------------------------------------------------------------  ------------------------------------------------------------ Left ventricle: The cavity size was normal. Wall thickness was normal. Systolic function was normal. The estimated ejection fraction was in the range of 55% to 60%. Wall motion was normal; there were no regional wall motion abnormalities. Features are consistent with a pseudonormal left ventricular filling pattern, with concomitant abnormal relaxation and increased filling pressure (grade 2 diastolic dysfunction).  ------------------------------------------------------------ Aortic valve: Trileaflet; moderately calcified leaflets. Doppler: There was moderate stenosis. Mild regurgitation. VTI  ratio of LVOT to aortic valve: 0.39. Valve area: 1.23cm^2(VTI). Indexed valve area: 0.63cm^2/m^2 (VTI). Peak velocity ratio of LVOT to aortic valve: 0.36. Valve area: 1.12cm^2 (Vmax). Indexed valve area: 0.57cm^2/m^2 (Vmax). Mean gradient: 29mm Hg (S). Peak gradient: 46mm Hg (S).  ------------------------------------------------------------ Aorta: Aortic root: The aortic root was normal in size. Ascending aorta: The ascending aorta was normal in size.  ------------------------------------------------------------ Mitral valve: Doppler: There was no evidence for stenosis. Mild regurgitation. Peak gradient: 5mm Hg (D).  ------------------------------------------------------------ Left atrium: The atrium was normal in size.  ------------------------------------------------------------ Right ventricle: The cavity size was normal. Systolic function was normal.  ------------------------------------------------------------ Pulmonic valve: Structurally normal valve. Cusp separation was normal. Doppler: Transvalvular velocity was within the normal range. No  regurgitation.  ------------------------------------------------------------ Tricuspid valve: Doppler: Trivial regurgitation.  ------------------------------------------------------------ Right atrium: The atrium was normal in size.  ------------------------------------------------------------ Pericardium: There was no pericardial effusion.  ------------------------------------------------------------ Systemic veins: Inferior vena cava: The vessel was normal in size; the respirophasic diameter changes were in the normal range (= 50%); findings are consistent with normal central venous pressure.  ------------------------------------------------------------  2D measurements Normal Doppler measurements Normal Left ventricle Main pulmonary LVID ED, 40.1 mm 43-52 artery chord, Pressure, 43 mm Hg =30 PLAX S LVID ES, 29 mm 23-38 Left ventricle chord, Ea, lat 7.9 cm/s ------ PLAX ann, tiss FS, chord, 28 % >29 DP PLAX E/Ea, lat 14.5 ------ LVPW, ED 8.95 mm ------ ann, tiss 6 IVS/LVPW 1.14 <1.3 DP ratio, ED Ea, med 7.35 cm/s ------ Ventricular septum ann, tiss IVS, ED 10.2 mm ------ DP LVOT E/Ea, med 15.6 ------ Diam, S 20 mm ------ ann, tiss 5 Area 3.14 cm^2 ------ DP Diam 20 mm ------ LVOT Aorta Peak vel, 120 cm/s ------ Root diam, 32 mm ------ S ED VTI, S 33.2 cm ------ Left atrium Peak 6 mm Hg ------ AP dim 36 mm ------ gradient, AP dim 1.85 cm/m^2 <2.2 S index Stroke vol 104. ml ------ 3 Stroke 53.5 ml/m^2 ------ index Aortic valve Peak vel, 338 cm/s ------ S Mean vel, 258 cm/s ------ S VTI, S 84.5 cm ------ Mean 29 mm Hg ------ gradient, S Peak 46 mm Hg ------ gradient, S VTI ratio 0.39 ------ LVOT/AV Area, VTI 1.23 cm^2 ------ Area index 0.63 cm^2/m ------ (VTI) ^2 Peak vel 0.36 ------ ratio, LVOT/AV Area, Vmax 1.12 cm^2 ------ Area index 0.57 cm^2/m ------ (Vmax) ^2 Regurg PHT 350 ms ------ Mitral valve Peak E vel 115 cm/s ------ Peak A vel  98.2 cm/s ------ Decelerati 162 ms 150-23 on time 0 Peak 5 mm Hg ------ gradient, D Peak E/A 1.2 ------ ratio Tricuspid valve Regurg 308 cm/s ------ peak vel Peak RV-RA 38 mm Hg ------ gradient, S Systemic veins Estimated 5 mm Hg ------ CVP Right ventricle Sa vel, 14.3 cm/s ------ lat ann, tiss DP  ------------------------------------------------------------ Prepared and Electronically Authenticated by  Marca Ancona, MD [id number]:[id number]   Cardiology Nuclear Med Study   SHAUNTIA LEVENGOOD is a 66 y.o. female MRN : [id number] DOB: 1945-11-07   Procedure Date: 06/26/2011   Nuclear Med Background   Indication for Stress Test: Evaluation for Ischemia   History: H/O Chemo; ~15 yrs ago GXT:OK per patient; 06/20/11 EF=55-60%, moderate AS, mild MR   Cardiac Risk Factors: Family History - CAD, History of Smoking, Hypertension, IDDM Type 2 and Lipids   Symptoms: Difficult Historian; Chest/Neck Pain (last episode of chest discomfort was about one month ago), Dizziness, DOE, Near Syncope and Palpitations   Nuclear Pre-Procedure   Caffeine/Decaff Intake: None  NPO After: 7:30am    Lungs: Clear.   IV 0.9% NS with Angio Cath: 22g    IV Site: R Hand   IV Started by: Stanton Kidney, EMT-P    Chest Size (in): 42   Cup Size: C    Height: 5\' 7"  (1.702 m)   Weight: 185 lb (83.915 kg)    BMI: Body mass index is 28.97 kg/(m^2).   Tech Comments: CBG=333 @ 11:30am, per patient.     Nuclear Med Study   1 or 2 day study: 1 day   Stress Test Type: Stress    Reading MD: Willa Rough, MD   Order Authorizing Provider: Charlton Haws, MD    Resting Radionuclide: Technetium 42m Tetrofosmin   Resting Radionuclide Dose: 11.0 mCi    Stress Radionuclide: Technetium 58m Tetrofosmin   Stress Radionuclide Dose: 33.0 mCi     Stress Protocol   Rest HR: 78   Stress HR: 153    Rest BP: 179/64   Stress BP: 196/69    Exercise Time (min): 4:00   METS: 5.2     Predicted Max HR: 153 bpm   % Max HR: 100 bpm    Rate Pressure Product: 16109  Dose of Adenosine (mg): n/a   Dose of Lexiscan: n/a mg    Dose of Atropine (mg): n/a   Dose of Dobutamine: n/a mcg/kg/min (at max HR)    Stress Test Technologist: Smiley Houseman, CMA-N   Nuclear Technologist: Domenic Polite, CNMT     Rest Procedure: Myocardial perfusion imaging was performed at rest 45 minutes following the intravenous administration of Technetium 53m Tetrofosmin.   Rest ECG: No acute changes with occasional PVC's.   Stress Procedure: The patient exercised on the treadmill utilizing the Bruce protocol for four minutes. She then stopped due to significant dyspnea with O2 SAT of 95% and marked ST-T wave changes that continued late into recovery. She denied any chest pain. Technetium 29m Tetrofosmin was injected at peak exercise and myocardial perfusion imaging was performed after a brief delay.   Images and EKG's were discussed with Dr. Johney Frame (DOD) and Dr. Myrtis Ser (Reader).   Stress ECG: The patient walked on the treadmill for 4 minutes. She developed significant shortness of breath. There was no significant chest pain. She develops significant EKG changes with1-59mm downsloping ST depression. There were no significant arrhythmias.   QPS   Raw Data Images: Normal; no motion artifact; normal heart/lung ratio.   Stress Images: There is moderately severe decreased uptake in a moderate area of affecting the apical inferolateral segment, apical anterolateral segment and the apical cap.   Rest Images: There is mild improvement in the defects noted on the stress images.   Subtraction (SDS): There is mild ischemia in the apical inferolateral wall anterolateral wall and apical cap.   Transient Ischemic Dilatation (Normal <1.22): 1.06   Lung/Heart Ratio (Normal <0.45): 0.32   Quantitative Gated Spect Images   QGS EDV: 98 ml   QGS ESV: 39 ml   Impression   Exercise Capacity: Fair exercise capacity.   BP Response: Normal blood pressure response.   Clinical  Symptoms: significant shortness of breath   ECG Impression: Significant EKG changes as outlined above.   Comparison with Prior Nuclear Study: No previous nuclear study performed   Overall Impression: The study is abnormal. The patient has shortness of breath with no chest pain. There were significant EKG changes with 1-46mm downsloping ST depression with stress. There is scar and ischemia in the apical aspect  of the inferolateral wall and the apical anterolateral wall. This is also present at the apical cap.   LV Ejection Fraction: 60%. LV Wall Motion: There is hypokinesis in several apical segments.   Willa Rough, MD             Catheterization    Indication: Markedly abnormal stress test with AS   Procedure: After informed consent and clinical "time out" the right groin was prepped and draped in a sterile fashion. A 5Fr sheath was placed in the right femoral artery using seldinger technique and local lidocaine. Standard JL4, JR4 and angled pigtail catheters were used to engage the coronary arteries. Coronary arteries were visualized in orthogonal views using caudal and cranial angulation. RAO ventriculography was done using 28* cc of contrast. LAO aortography was done using 30cc of contrast   Medications:  Versed: 2 mg's Fentanyl: 25 ug's   Coronary Arteries:   Right dominant with no anomalies   LM: 70% Diffuse with caliber of LM much smaller than proximal LAD.    LAD: Calcified 60% distal discrete stenosis   D1: 80% mid vessel stenosis   Circumflex: calcified primarily OM only no significant disease   OM1: normal   RCA: 30% proximal Heavy calcification of proximal and mid vessel 70% mid   PDA: 30-40% mid   PLA: normal   Ventriculography: EF: 55 %, Apical hypokinesis   Hemodynamics:   Aortic Pressure: 175 83 mmHg LV Pressure: 201 14 15 MmHg   Right Heart Catheterization:   Indication: AS   Procedure: Standard right heart catheterization was done from the right femoral vein  using a 7Fr sheath and balloon flotation catheter.   Mean RA: 5 mmHg   RV: 31/4 mmHg   PA: 29/10 mmHg with a mean of 18 mmHg   PCWP: 10 mmHg   Fick Cardiac Output: 6.4 L/minute, 3.3 L/minute/M2   Aortic Valve mean gradient 19 mmHg Peak to Peak gradient 26 mmHg   Hakki AVA: .8 cm2   Impression: Films reviewed with Dr Alice Reichert. LM is significant and diffuse. ECG on stress test had 2mm ST legment depression and catheter damping.   Echo shows mean gradient of 29 mmH and peak of 46 mmHg. Best Rx will be CABG with AVR. Called Dr Tyrone Sage who is in OR to see patient in recovery   She is stable for D/C but hopefully can arrange surgery for next week.   Charlton Haws   08/21/2011   12:36 PM                Impression:   I've directly reviewed the patient's recent echocardiogram and cardiac catheterization. The patient clearly has severe symptomatic aortic stenosis with preserved left ventricular function. The aortic valve appears tricuspid but it is clearly stenotic. The left ventricular outflow tract is relatively small, measuring only 2.0 cm in diameter. The etiology of aortic valve disease could be simple calcific aortic stenosis, although the patient may have history of rheumatic heart disease and she also underwent radiation therapy to the mediastinum for treatment of lung cancer. There is no mitral valve prolapse nor mitral regurgitation.  There is probably 60-70% stenosis of the left main coronary artery with significant three-vessel coronary artery disease. There is somewhat diffuse calcification and disease involving all of the coronary arteries proximally which is not surprising given the patient's long-standing history of diabetes mellitus.  There is right dominant coronary circulation. The left circumflex system is relatively small giving rise to one relatively small obtuse  marginal branch that may not be large enough for grafting at the time of surgery. From a technical standpoint the  risks of surgery may be increased substantially do to the patient's history of previous radiation therapy.  Perioperative risks will also be somewhat increased do to the patient's numerous comorbid medical problems as noted previously. However, there is no question that the patient's only reasonable long-term treatment option is aortic valve replacement with coronary artery bypass grafting.     Plan:   I've reviewed the indications, risks, and potential benefits of surgery at length with the patient and her daughter here in the office today.  The patient was counseled at length regarding surgical alternatives with respect to valve replacement. In particular, discussion was held comparing in contrast and the risks of mechanical valve replacement and the need for lifelong anticoagulation versus use of a bioprosthetic tissue valve and the associated potential for late structural valve deterioration in failure.  This discussion was placed in the context of the patient's particular circumstances, and as a result the patient specifically requests that their valve be replaced using a bioprosthetic tissue valve.  The need for coronary artery bypass grafting was also explained in detail. The potential for increased risk related to the patient's previous history of lung cancer with radiation therapy to the chest and mediastinum were discussed.  They understand and accept all potential associated risks of surgery including but not limited to risk of death, stroke, myocardial infarction, congestive heart failure, respiratory failure, renal failure, pneumonia, bleeding requiring blood transfusion and or reexploration, arrhythmia, heart block or bradycardia requiring permanent pacemaker, pleural effusions or other delayed complications related to continued congestive heart failure, other late complications related to valve replacement, or late recurrence of symptomatic coronary artery disease.   We will obtain followup  chest CT scan given the patient's history of lung cancer in the distant past. Also obtain pulmonary function tests and followup records from the patient's previous lung cancer treatment. We will send her for dental consult for possible need of extraction of her remaining teeth. Finally, we will try to get her back into her endocrinologist's office to evaluate why she has been having some much trouble managing her blood sugars over the past month. Will plan to see her back in 2 weeks and possibly schedule surgery at that time.         Salvatore Decent. Cornelius Moras, MD 08/22/2011 11:06 AM

## 2011-08-27 ENCOUNTER — Ambulatory Visit (HOSPITAL_COMMUNITY)
Admission: RE | Admit: 2011-08-27 | Discharge: 2011-08-27 | Disposition: A | Discharge status: Home / Self Care | Payer: Medicare Other | Source: Ambulatory Visit | Attending: Thoracic Surgery (Cardiothoracic Vascular Surgery) | Admitting: Thoracic Surgery (Cardiothoracic Vascular Surgery)

## 2011-08-27 ENCOUNTER — Other Ambulatory Visit (HOSPITAL_COMMUNITY): Payer: Self-pay

## 2011-08-27 ENCOUNTER — Encounter (HOSPITAL_COMMUNITY): Payer: Self-pay | Admitting: Pharmacy Technician

## 2011-08-27 DIAGNOSIS — R0989 Other specified symptoms and signs involving the circulatory and respiratory systems: Secondary | ICD-10-CM | POA: Insufficient documentation

## 2011-08-27 DIAGNOSIS — I359 Nonrheumatic aortic valve disorder, unspecified: Secondary | ICD-10-CM | POA: Insufficient documentation

## 2011-08-27 DIAGNOSIS — R062 Wheezing: Secondary | ICD-10-CM | POA: Insufficient documentation

## 2011-08-27 DIAGNOSIS — Z01811 Encounter for preprocedural respiratory examination: Secondary | ICD-10-CM | POA: Insufficient documentation

## 2011-08-27 DIAGNOSIS — I2510 Atherosclerotic heart disease of native coronary artery without angina pectoris: Secondary | ICD-10-CM | POA: Insufficient documentation

## 2011-08-27 DIAGNOSIS — C3490 Malignant neoplasm of unspecified part of unspecified bronchus or lung: Secondary | ICD-10-CM

## 2011-08-27 DIAGNOSIS — R0609 Other forms of dyspnea: Secondary | ICD-10-CM | POA: Insufficient documentation

## 2011-08-27 LAB — PULMONARY FUNCTION TEST

## 2011-08-27 LAB — BLOOD GAS, ARTERIAL
Drawn by: 242311
pCO2 arterial: 39.3 mmHg (ref 35.0–45.0)
pH, Arterial: 7.386 (ref 7.350–7.450)
pO2, Arterial: 77.2 mmHg — ABNORMAL LOW (ref 80.0–100.0)

## 2011-08-27 MED ORDER — IOHEXOL 300 MG/ML  SOLN
80.0000 mL | Freq: Once | INTRAMUSCULAR | Status: AC | PRN
  Administered 2011-08-27: 80 mL via INTRAVENOUS

## 2011-08-27 MED ORDER — ALBUTEROL SULFATE (5 MG/ML) 0.5% IN NEBU: 2.5000 mg | INHALATION_SOLUTION | Freq: Once | RESPIRATORY_TRACT | Status: AC

## 2011-08-28 ENCOUNTER — Encounter (HOSPITAL_COMMUNITY)
Admission: RE | Admit: 2011-08-28 | Discharge: 2011-08-28 | Disposition: A | Discharge status: Home / Self Care | Payer: Medicare Other | Source: Ambulatory Visit | Attending: Dentistry | Admitting: Dentistry

## 2011-08-28 ENCOUNTER — Encounter: Payer: Self-pay | Admitting: Thoracic Surgery (Cardiothoracic Vascular Surgery)

## 2011-08-28 ENCOUNTER — Ambulatory Visit (HOSPITAL_COMMUNITY)
Admission: RE | Admit: 2011-08-28 | Discharge: 2011-08-28 | Disposition: A | Discharge status: Home / Self Care | Payer: Medicare Other | Source: Ambulatory Visit | Attending: Dentistry | Admitting: Dentistry

## 2011-08-28 ENCOUNTER — Other Ambulatory Visit (HOSPITAL_COMMUNITY): Payer: Self-pay

## 2011-08-28 ENCOUNTER — Encounter (HOSPITAL_COMMUNITY): Payer: Self-pay

## 2011-08-28 ENCOUNTER — Ambulatory Visit (INDEPENDENT_AMBULATORY_CARE_PROVIDER_SITE_OTHER): Payer: Medicare Other | Admitting: Thoracic Surgery (Cardiothoracic Vascular Surgery)

## 2011-08-28 VITALS — BP 184/81 | HR 91 | Resp 18 | Ht 67.0 in | Wt 182.0 lb

## 2011-08-28 DIAGNOSIS — I2510 Atherosclerotic heart disease of native coronary artery without angina pectoris: Secondary | ICD-10-CM

## 2011-08-28 DIAGNOSIS — J984 Other disorders of lung: Secondary | ICD-10-CM | POA: Insufficient documentation

## 2011-08-28 DIAGNOSIS — Z01818 Encounter for other preprocedural examination: Secondary | ICD-10-CM | POA: Insufficient documentation

## 2011-08-28 DIAGNOSIS — I359 Nonrheumatic aortic valve disorder, unspecified: Secondary | ICD-10-CM

## 2011-08-28 DIAGNOSIS — Z01812 Encounter for preprocedural laboratory examination: Secondary | ICD-10-CM | POA: Insufficient documentation

## 2011-08-28 DIAGNOSIS — I350 Nonrheumatic aortic (valve) stenosis: Secondary | ICD-10-CM

## 2011-08-28 DIAGNOSIS — R0602 Shortness of breath: Secondary | ICD-10-CM | POA: Insufficient documentation

## 2011-08-28 DIAGNOSIS — Z923 Personal history of irradiation: Secondary | ICD-10-CM | POA: Insufficient documentation

## 2011-08-28 HISTORY — DX: Nonrheumatic aortic (valve) stenosis: I35.0

## 2011-08-28 HISTORY — DX: Gastro-esophageal reflux disease without esophagitis: K21.9

## 2011-08-28 HISTORY — DX: Unspecified convulsions: R56.9

## 2011-08-28 LAB — CBC
HCT: 37.7 % (ref 36.0–46.0)
MCH: 27.3 pg (ref 26.0–34.0)
MCHC: 32.4 g/dL (ref 30.0–36.0)
MCV: 84.3 fL (ref 78.0–100.0)
RDW: 14.5 % (ref 11.5–15.5)

## 2011-08-28 LAB — BASIC METABOLIC PANEL
BUN: 22 mg/dL (ref 6–23)
CO2: 27 mEq/L (ref 19–32)
Chloride: 101 mEq/L (ref 96–112)
Creatinine, Ser: 1.11 mg/dL — ABNORMAL HIGH (ref 0.50–1.10)
Glucose, Bld: 127 mg/dL — ABNORMAL HIGH (ref 70–99)

## 2011-08-28 MED ORDER — CEFAZOLIN SODIUM-DEXTROSE 2-3 GM-% IV SOLR
2.0000 g | INTRAVENOUS | Status: AC
  Administered 2011-08-29: 2 g via INTRAVENOUS
  Filled 2011-08-28: qty 50

## 2011-08-28 NOTE — Pre-Procedure Instructions (Signed)
20 Jaclyn [name]  08/28/2011   Your procedure is scheduled on:  08/29/2011  Report to [facility] at  8:00AM. Per MD request  Call this number if you have problems the morning of surgery: [phone]   Remember:   Do not eat food or drink :After Midnight  Wednesday       Take these medicines the morning of surgery with A SIP OF WATER: maintain basal rate in pump, levoxyl    Do not wear jewelry, make-up or nail polish.  Do not wear lotions, powders, or perfumes. You may wear deodorant.  Do not shave 48 hours prior to surgery. Men may shave face and neck.  Do not bring valuables to the hospital.  Contacts, dentures or bridgework may not be worn into surgery.  Leave suitcase in the car. After surgery it may be brought to your room.  For patients admitted to the hospital, checkout time is 11:00 AM the day of discharge.   Patients discharged the day of surgery will not be allowed to drive home.  Name and phone number of your driver: with daughter   Special Instructions: CHG Shower Use Special Wash: 1/2 bottle night before surgery and 1/2 bottle morning of surgery.   Please read over the following fact sheets that you were given: Pain Booklet, Coughing and Deep Breathing and Surgical Site Infection Prevention

## 2011-08-28 NOTE — Progress Notes (Signed)
[address]            [name] [address]          [phone]     CARDIOTHORACIC SURGERY OFFICE NOTE  Referring Provider is Wendall Stade, MD PCP is Robb Matar, MD   HPI:  Patient returns for further followup of severe symptomatic aortic stenosis and coronary artery disease. She was originally seen in consultation on 08/22/2011. Since then she has been seen in consultation by Dr. Valentino Hue and she plans to have dental extraction in the operating room tomorrow. She also has return to see March Rummage at [facility] who helps manage her insulin-dependent diabetes mellitus. She has changed the insulin in her insulin pump, thinking this has been the reason why she was having so much trouble with blood sugar control. She returns to the office today to review the results of her recent pulmonary function tests and chest CT scan and hopefully make final plans for surgery.   Current Outpatient Prescriptions  Medication Sig Dispense Refill  . aspirin 325 MG EC tablet Take 325 mg by mouth daily.      . calcium carbonate (OS-CAL) 1250 MG chewable tablet Chew 1 tablet by mouth daily.      . chlorhexidine (PERIDEX) 0.12 % solution Use as directed 15 mLs in the mouth or throat 2 (two) times daily. Rinse with 15 mls twice daily for 30 seconds. Use after breakfast and at bedtime. Spit out excess. Do not swallow.      . cholecalciferol (VITAMIN D) 1000 UNITS tablet Take 1,000 Units by mouth daily.      . Insulin Human (INSULIN PUMP) 100 unit/ml SOLN Inject into the skin. humalog- basal rate continuously & boluses as appropriate to her carbs & blood glucose      . levothyroxine (SYNTHROID, LEVOTHROID) 112 MCG tablet Take 112 mcg by mouth daily before breakfast.       . losartan (COZAAR) 50 MG tablet Take 50 mg by mouth daily before breakfast.       . magnesium hydroxide (MILK OF MAGNESIA) 400 MG/5ML suspension Take 30 mLs by mouth daily as needed. For constipation       . Sennosides (EX-LAX PO) Take 2 tablets by mouth daily as needed. For constipation       No current facility-administered medications for this visit.   Facility-Administered Medications Ordered in Other Visits  Medication Dose Route Frequency Provider Last Rate Last Dose  . albuterol (PROVENTIL) (5 MG/ML) 0.5% nebulizer solution 2.5 mg  2.5 mg Nebulization Once Purcell Nails, MD      . ceFAZolin (ANCEF) IVPB 2 g/50 mL premix  2 g Intravenous 60 min Pre-Op Charlynne Pander, DDS          Physical Exam:   BP 184/81  Pulse 91  Resp 18  Ht 5\' 7"  (1.702 m)  Wt 182 lb (82.555 kg)  BMI 28.51 kg/m2  SpO2 99%  General:  Well-appearing  Chest:   Clear to auscultation  CV:   Regular rate and rhythm with prominent systolic murmur  Incisions:  n/a  Abdomen:  Soft and nontender  Extremities:  Warm and well-perfused  Diagnostic Tests:  Pulmonary Function Tests  FVC  2.04 L  (58% predicted) FEV1  1.38 L  (52% predicted) FEF25-75 0.82 L  (37% predicted) RV  2.29 L  (100% predicted) DLCO  88%  predicted  Interpretation c/w moderate-severe COPD with significant improvement following bronchodilator therapy      CT CHEST WITH CONTRAST   Technique:  Multidetector CT imaging of the chest was performed following the standard protocol during bolus administration of intravenous contrast.   Contrast: 80mL OMNIPAQUE IOHEXOL 300 MG/ML  SOLN   Comparison: CT scan of the chest dated 12/22/2009   Findings: There has been no significant change in the appearance of the chest since the prior CT scan dated 12/22/2009.  There is chronic consolidation and scarring in the medial aspect of the right upper lobe and in the medial aspect of the superior segment right lower lobe secondary to radiation therapy.   There is no hilar or mediastinal adenopathy.  There is stable scarring in the lung apices.   There is chronic pericardial thickening.  Heart size is normal. Extensive coronary artery  calcification and aortic valvular calcification.  No acute osseous abnormality.  Visualized portion of the upper abdomen is normal.  Tiny hiatal hernia.   IMPRESSION:   No significant change in the appearance of the chest since the prior study of 12/22/2009.  Stable scarring in the right lung secondary to radiation therapy.  Stable slight pericardial thickening.   No evidence of recurrent lung cancer.   Original Report Authenticated By: Gwynn Burly, M.D.     Impression:  Followup chest CT scan remained stable with no sign of recurrence of lung cancer now 14 years out.  Pulmonary function tests document the presence of moderate to severe chronic obstructive pulmonary disease. Spirometry improve with bronchodilator therapy.  The presence of significant COPD and previous radiation to the right lung and mediastinum likely will increase risks of surgery significantly.  However, the patient's underlying lung disease is not severe enough to preclude surgical treatment for underlying severe symptomatic aortic stenosis and coronary artery disease.  Hopefully the recent changes insulin will improve her glycemic control.   Plan:  The patient plans to proceed with dental extraction tomorrow.  She desires to wait at least a week following dental extraction prior to proceeding with aortic valve replacement and coronary artery bypass grafting. We tentatively plan to proceed with surgery on Wednesday, July 31. The patient will return to the office year on Monday, July 29 for final followup prior to surgery. All of her questions been addressed.   Salvatore Decent. Cornelius Moras, MD 08/28/2011 4:55 PM

## 2011-08-28 NOTE — Consult Note (Signed)
Anesthesia Chart Review:  Patient is a 66 year old female scheduled for dental extractions tomorrow.  She will need AVR/CABG by Dr. Cornelius Moras in the near future.  (See notes in Epic.)  Other medical history reviewed and includes lung cancer, radiation fibrosis of the lung, and DM with an insulin pump.  She plans to keep her pump at the basal rate while she is NPO.  Labs noted.  K 5.6, Cr 1.11, glucose 127.  Will check an ISTAT on arrival.  CXR on 08/28/11 showed stable chest x-ray with scarring in the right paratracheal region and right apex. No active lung disease.   PFTs from 08/27/11 demonstrated moderate to severe COPD.  Dr. Cornelius Moras felt this would increase her surgery risk significantly, but did not feel it was severe enough to preclude surgical treatment for AS/CAD.  EKG on 08/12/11 showed NSR with PVCs.  Cardiac cath on 08/21/11 showed: Coronary Arteries:  Right dominant with no anomalies  LM: 70% Diffuse with caliber of LM much smaller than proximal LAD.  LAD: Calcified 60% distal discrete stenosis  D1: 80% mid vessel stenosis  Circumflex: calcified primarily OM only no significant disease  OM1: normal  RCA: 30% proximal Heavy calcification of proximal and mid vessel 70% mid  PDA: 30-40% mid  PLA: normal  Ventriculography: EF: 55 %, Apical hypokinesis  Hemodynamics:  Aortic Pressure: 175 83 mmHg LV Pressure: 201 14 15 MmHg  Right Heart Catheterization:  Mean RA: 5 mmHg  RV: 31/4 mmHg  PA: 29/10 mmHg with a mean of 18 mmHg  PCWP: 10 mmHg  Fick Cardiac Output: 6.4 L/minute, 3.3 L/minute/M2  Aortic Valve mean gradient 19 mmHg Peak to Peak gradient 26 mmHg  Hakki AVA: .8 cm2  Echo on 06/20/11 showed: - Left ventricle: The cavity size was normal. Wall thickness was normal. Systolic function was normal. The estimated ejection fraction was in the range of 55% to 60%. Wall motion was normal; there were no regional wall motion abnormalities. Features are consistent with a pseudonormal left  ventricular filling pattern, with concomitant abnormal relaxation and increased filling pressure (grade 2 diastolic dysfunction). - Aortic valve: Trileaflet; moderately calcified leaflets. There was moderate stenosis. Mild regurgitation. Mean gradient: 29mm Hg (S). Peak gradient: 46mm Hg (S). Valve area: 1.23cm^2(VTI). - Mitral valve: Mild regurgitation. - Right ventricle: The cavity size was normal. Systolic function was normal. - Tricuspid valve: Peak RV-RA gradient: 38mm Hg (S). - Pulmonary arteries: PA peak pressure: 43mm Hg (S). - Inferior vena cava: The vessel was normal in size; the respirophasic diameter changes were in the normal range (= 50%); findings are consistent with normal central venous pressure.  Plan to proceed if follow-up labs are stable, and there is no significant change in her status.  Shonna Chock, PA-C

## 2011-08-28 NOTE — Progress Notes (Signed)
Call to A. Zelenak,PAC, reported pt. Use of insulin pump, Dr. Kristin Bruins order for anesth. Consult.

## 2011-08-29 ENCOUNTER — Other Ambulatory Visit: Payer: Self-pay | Admitting: *Deleted

## 2011-08-29 ENCOUNTER — Ambulatory Visit (HOSPITAL_COMMUNITY)
Admission: RE | Admit: 2011-08-29 | Discharge: 2011-08-29 | Disposition: A | Discharge status: Home / Self Care | Payer: Medicare Other | Source: Ambulatory Visit | Attending: Dentistry | Admitting: Dentistry

## 2011-08-29 ENCOUNTER — Encounter (HOSPITAL_COMMUNITY): Payer: Self-pay | Admitting: *Deleted

## 2011-08-29 ENCOUNTER — Encounter (HOSPITAL_COMMUNITY): Payer: Self-pay | Admitting: Vascular Surgery

## 2011-08-29 ENCOUNTER — Encounter (HOSPITAL_COMMUNITY)
Admission: RE | Disposition: A | Discharge status: Home / Self Care | Payer: Self-pay | Source: Ambulatory Visit | Attending: Dentistry

## 2011-08-29 ENCOUNTER — Ambulatory Visit (HOSPITAL_COMMUNITY): Payer: Medicare Other | Admitting: Vascular Surgery

## 2011-08-29 ENCOUNTER — Encounter (HOSPITAL_COMMUNITY): Payer: Self-pay | Admitting: Anesthesiology

## 2011-08-29 ENCOUNTER — Encounter (HOSPITAL_COMMUNITY): Payer: Self-pay | Admitting: Pharmacy Technician

## 2011-08-29 DIAGNOSIS — K219 Gastro-esophageal reflux disease without esophagitis: Secondary | ICD-10-CM | POA: Insufficient documentation

## 2011-08-29 DIAGNOSIS — I350 Nonrheumatic aortic (valve) stenosis: Secondary | ICD-10-CM

## 2011-08-29 DIAGNOSIS — K0530 Chronic periodontitis, unspecified: Secondary | ICD-10-CM | POA: Insufficient documentation

## 2011-08-29 DIAGNOSIS — I359 Nonrheumatic aortic valve disorder, unspecified: Secondary | ICD-10-CM

## 2011-08-29 DIAGNOSIS — I2510 Atherosclerotic heart disease of native coronary artery without angina pectoris: Secondary | ICD-10-CM | POA: Insufficient documentation

## 2011-08-29 DIAGNOSIS — Z01818 Encounter for other preprocedural examination: Secondary | ICD-10-CM | POA: Insufficient documentation

## 2011-08-29 DIAGNOSIS — I10 Essential (primary) hypertension: Secondary | ICD-10-CM | POA: Insufficient documentation

## 2011-08-29 DIAGNOSIS — Z794 Long term (current) use of insulin: Secondary | ICD-10-CM | POA: Insufficient documentation

## 2011-08-29 DIAGNOSIS — E109 Type 1 diabetes mellitus without complications: Secondary | ICD-10-CM | POA: Insufficient documentation

## 2011-08-29 DIAGNOSIS — Z01812 Encounter for preprocedural laboratory examination: Secondary | ICD-10-CM | POA: Insufficient documentation

## 2011-08-29 HISTORY — PX: MULTIPLE EXTRACTIONS WITH ALVEOLOPLASTY: SHX5342

## 2011-08-29 LAB — POCT I-STAT 4, (NA,K, GLUC, HGB,HCT)
Glucose, Bld: 195 mg/dL — ABNORMAL HIGH (ref 70–99)
HCT: 38 % (ref 36.0–46.0)
Hemoglobin: 12.9 g/dL (ref 12.0–15.0)

## 2011-08-29 SURGERY — MULTIPLE EXTRACTION WITH ALVEOLOPLASTY
Anesthesia: General | Wound class: Clean Contaminated

## 2011-08-29 MED ORDER — ISOPROPYL ALCOHOL 70 % SOLN
Status: DC | PRN
  Administered 2011-08-29: 1 via TOPICAL

## 2011-08-29 MED ORDER — HYDROCODONE-ACETAMINOPHEN 7.5-500 MG/15ML PO SOLN: ORAL | Status: DC

## 2011-08-29 MED ORDER — FENTANYL CITRATE 0.05 MG/ML IJ SOLN
INTRAMUSCULAR | Status: DC | PRN
  Administered 2011-08-29: 50 ug via INTRAVENOUS

## 2011-08-29 MED ORDER — BUPIVACAINE-EPINEPHRINE 0.5% -1:200000 IJ SOLN
INTRAMUSCULAR | Status: DC | PRN
  Administered 2011-08-29: 3.6 mL

## 2011-08-29 MED ORDER — LACTATED RINGERS IV SOLN
INTRAVENOUS | Status: DC | PRN
  Administered 2011-08-29: 10:00:00 via INTRAVENOUS

## 2011-08-29 MED ORDER — PROPOFOL 10 MG/ML IV EMUL
INTRAVENOUS | Status: DC | PRN
  Administered 2011-08-29: 50 ug/kg/min via INTRAVENOUS

## 2011-08-29 MED ORDER — BUPIVACAINE-EPINEPHRINE (PF) 0.5% -1:200000 IJ SOLN
INTRAMUSCULAR | Status: AC
  Filled 2011-08-29: qty 3.6

## 2011-08-29 MED ORDER — LIDOCAINE-EPINEPHRINE 2 %-1:100000 IJ SOLN
INTRAMUSCULAR | Status: DC | PRN
  Administered 2011-08-29: 1.7 mL

## 2011-08-29 MED ORDER — BUPIVACAINE-EPINEPHRINE (PF) 0.5% -1:200000 IJ SOLN
INTRAMUSCULAR | Status: AC
  Filled 2011-08-29: qty 1.8

## 2011-08-29 MED ORDER — LIDOCAINE-EPINEPHRINE 2 %-1:100000 IJ SOLN
INTRAMUSCULAR | Status: AC
  Filled 2011-08-29: qty 3.4

## 2011-08-29 MED ORDER — 0.9 % SODIUM CHLORIDE (POUR BTL) OPTIME
TOPICAL | Status: DC | PRN
  Administered 2011-08-29: 1000 mL

## 2011-08-29 MED ORDER — MIDAZOLAM HCL 5 MG/5ML IJ SOLN
INTRAMUSCULAR | Status: DC | PRN
  Administered 2011-08-29: 1 mg via INTRAVENOUS

## 2011-08-29 SURGICAL SUPPLY — 36 items
ALCOHOL 70% 16 OZ (MISCELLANEOUS) ×2 IMPLANT
ATTRACTOMAT 16X20 MAGNETIC DRP (DRAPES) ×2 IMPLANT
BLADE SURG 15 STRL LF DISP TIS (BLADE) ×2 IMPLANT
BLADE SURG 15 STRL SS (BLADE) ×2
CLOTH BEACON ORANGE TIMEOUT ST (SAFETY) ×2 IMPLANT
COVER SURGICAL LIGHT HANDLE (MISCELLANEOUS) ×2 IMPLANT
CRADLE DONUT ADULT HEAD (MISCELLANEOUS) ×2 IMPLANT
GAUZE PACKING FOLDED 2  STR (GAUZE/BANDAGES/DRESSINGS) ×1
GAUZE PACKING FOLDED 2 STR (GAUZE/BANDAGES/DRESSINGS) ×1 IMPLANT
GAUZE SPONGE 4X4 16PLY XRAY LF (GAUZE/BANDAGES/DRESSINGS) ×2 IMPLANT
GLOVE SURG ORTHO 8.0 STRL STRW (GLOVE) ×2 IMPLANT
GLOVE SURG SS PI 6.5 STRL IVOR (GLOVE) ×2 IMPLANT
GOWN STRL REIN 3XL LVL4 (GOWN DISPOSABLE) ×2 IMPLANT
HEMOSTAT SURGICEL .5X2 ABSORB (HEMOSTASIS) IMPLANT
KIT BASIN OR (CUSTOM PROCEDURE TRAY) ×2 IMPLANT
KIT ROOM TURNOVER OR (KITS) ×2 IMPLANT
MANIFOLD NEPTUNE WASTE (CANNULA) ×2 IMPLANT
NDL BLUNT 16X1.5 OR ONLY (NEEDLE) ×1 IMPLANT
NDL DENTAL 27 LONG (NEEDLE) IMPLANT
NEEDLE BLUNT 16X1.5 OR ONLY (NEEDLE) ×2 IMPLANT
NEEDLE DENTAL 27 LONG (NEEDLE) IMPLANT
NS IRRIG 1000ML POUR BTL (IV SOLUTION) ×2 IMPLANT
PACK EENT II TURBAN DRAPE (CUSTOM PROCEDURE TRAY) ×2 IMPLANT
PAD ARMBOARD 7.5X6 YLW CONV (MISCELLANEOUS) ×4 IMPLANT
SPONGE SURGIFOAM ABS GEL 100 (HEMOSTASIS) IMPLANT
SPONGE SURGIFOAM ABS GEL 12-7 (HEMOSTASIS) IMPLANT
SPONGE SURGIFOAM ABS GEL SZ50 (HEMOSTASIS) IMPLANT
SUCTION FRAZIER TIP 10 FR DISP (SUCTIONS) ×1 IMPLANT
SUT CHROMIC 3 0 PS 2 (SUTURE) ×4 IMPLANT
SUT CHROMIC 4 0 P 3 18 (SUTURE) IMPLANT
SYR 50ML SLIP (SYRINGE) ×2 IMPLANT
TOWEL OR 17X24 6PK STRL BLUE (TOWEL DISPOSABLE) ×2 IMPLANT
TOWEL OR 17X26 10 PK STRL BLUE (TOWEL DISPOSABLE) ×2 IMPLANT
TUBE CONNECTING 12X1/4 (SUCTIONS) ×2 IMPLANT
WATER STERILE IRR 1000ML POUR (IV SOLUTION) ×2 IMPLANT
YANKAUER SUCT BULB TIP NO VENT (SUCTIONS) ×2 IMPLANT

## 2011-08-29 NOTE — Progress Notes (Signed)
Arrived via w/c from pacu... c a  And o... No bleeding  Noted from  Mouth.  icepack in place.   Taking sips of  Clear liquids. Without omplaints.

## 2011-08-29 NOTE — H&P (Signed)
08/29/2011 Patient:            Jaclyn [name] Date of Birth:  10/11/1945 MRN:                [id number] See recent note from Dr. Cornelius Moras to act as H and P for dental OR procedures. Dr. Kristin Bruins  Progress Notes     [address]  [name] [address]  [phone]  CARDIOTHORACIC SURGERY OFFICE NOTE  Referring Provider is Wendall Stade, MD  PCP is Robb Matar, MD  HPI:  Patient returns for further followup of severe symptomatic aortic stenosis and coronary artery disease. She was originally seen in consultation on 08/22/2011. Since then she has been seen in consultation by Dr. Valentino Hue and she plans to have dental extraction in the operating room tomorrow. She also has return to see March Rummage at [facility] who helps manage her insulin-dependent diabetes mellitus. She has changed the insulin in her insulin pump, thinking this has been the reason why she was having so much trouble with blood sugar control. She returns to the office today to review the results of her recent pulmonary function tests and chest CT scan and hopefully make final plans for surgery.     Current Outpatient Prescriptions     Medication  Sig  Dispense  Refill     .  aspirin 325 MG EC tablet  Take 325 mg by mouth daily.       .  calcium carbonate (OS-CAL) 1250 MG chewable tablet  Chew 1 tablet by mouth daily.       .  chlorhexidine (PERIDEX) 0.12 % solution  Use as directed 15 mLs in the mouth or throat 2 (two) times daily. Rinse with 15 mls twice daily for 30 seconds. Use after breakfast and at bedtime. Spit out excess. Do not swallow.       .  cholecalciferol (VITAMIN D) 1000 UNITS tablet  Take 1,000 Units by mouth daily.       .  Insulin Human (INSULIN PUMP) 100 unit/ml SOLN  Inject into the skin. humalog- basal rate continuously & boluses as appropriate to her carbs & blood glucose       .  levothyroxine (SYNTHROID, LEVOTHROID) 112 MCG tablet  Take 112 mcg by mouth daily before breakfast.         .  losartan (COZAAR) 50 MG tablet  Take 50 mg by mouth daily before breakfast.       .  magnesium hydroxide (MILK OF MAGNESIA) 400 MG/5ML suspension  Take 30 mLs by mouth daily as needed. For constipation       .  Sennosides (EX-LAX PO)  Take 2 tablets by mouth daily as needed. For constipation       No current facility-administered medications for this visit.      Facility-Administered Medications Ordered in Other Visits      Medication  Dose  Route  Frequency  Provider  Last Rate  Last Dose      .  albuterol (PROVENTIL) (5 MG/ML) 0.5% nebulizer solution 2.5 mg  2.5 mg  Nebulization  Once  Purcell Nails, MD        .  ceFAZolin (ANCEF) IVPB 2 g/50 mL premix  2 g  Intravenous  60 min Pre-Op  Charlynne Pander, DDS        Physical Exam:  BP 184/81  Pulse 91  Resp 18  Ht 5\' 7"  (1.702 m)  Wt 182 lb (82.555  kg)  BMI 28.51 kg/m2  SpO2 99%  General: Well-appearing  Chest: Clear to auscultation  CV: Regular rate and rhythm with prominent systolic murmur  Incisions: n/a  Abdomen: Soft and nontender  Extremities: Warm and well-perfused  Diagnostic Tests:  Pulmonary Function Tests  FVC 2.04 L (58% predicted)  FEV1 1.38 L (52% predicted)  FEF25-75 0.82 L (37% predicted)  RV 2.29 L (100% predicted)  DLCO 88% predicted  Interpretation c/w moderate-severe COPD with significant improvement following bronchodilator therapy  CT CHEST WITH CONTRAST  Technique: Multidetector CT imaging of the chest was performed  following the standard protocol during bolus administration of  intravenous contrast.  Contrast: 80mL OMNIPAQUE IOHEXOL 300 MG/ML SOLN  Comparison: CT scan of the chest dated 12/22/2009  Findings: There has been no significant change in the appearance of  the chest since the prior CT scan dated 12/22/2009. There is  chronic consolidation and scarring in the medial aspect of the  right upper lobe and in the medial aspect of the superior segment  right lower lobe secondary to  radiation therapy.  There is no hilar or mediastinal adenopathy. There is stable  scarring in the lung apices.  There is chronic pericardial thickening. Heart size is normal.  Extensive coronary artery calcification and aortic valvular  calcification. No acute osseous abnormality. Visualized portion  of the upper abdomen is normal. Tiny hiatal hernia.  IMPRESSION:  No significant change in the appearance of the chest since the  prior study of 12/22/2009. Stable scarring in the right lung  secondary to radiation therapy. Stable slight pericardial  thickening.  No evidence of recurrent lung cancer.  Original Report Authenticated By: Gwynn Burly, M.D.  Impression:  Followup chest CT scan remained stable with no sign of recurrence of lung cancer now 14 years out. Pulmonary function tests document the presence of moderate to severe chronic obstructive pulmonary disease. Spirometry improve with bronchodilator therapy. The presence of significant COPD and previous radiation to the right lung and mediastinum likely will increase risks of surgery significantly. However, the patient's underlying lung disease is not severe enough to preclude surgical treatment for underlying severe symptomatic aortic stenosis and coronary artery disease. Hopefully the recent changes insulin will improve her glycemic control.  Plan:  The patient plans to proceed with dental extraction tomorrow. She desires to wait at least a week following dental extraction prior to proceeding with aortic valve replacement and coronary artery bypass grafting. We tentatively plan to proceed with surgery on Wednesday, July 31. The patient will return to the office year on Monday, July 29 for final followup prior to surgery. All of her questions been addressed.  Salvatore Decent. Cornelius Moras, MD  08/28/2011  4:55 PM

## 2011-08-29 NOTE — Progress Notes (Signed)
Pt. Has insulin pump set on basal rate

## 2011-08-29 NOTE — Op Note (Signed)
Patient:            Jaclyn [name] Date of Birth:  06-Jul-1945 MRN:                [id number]   DATE OF PROCEDURE:  08/29/2011               OPERATIVE REPORT   PREOPERATIVE DIAGNOSES: 1. Aortic stenosis 2. Coronary artery disease 3. Preaortic valve replacement dental protocol 4. Chronic periodontitis  POSTOPERATIVE DIAGNOSES: 1. Aortic stenosis 2. Coronary artery disease 3. Preaortic valve replacement dental protocol 4. Chronic periodontitis  OPERATIONS: 1. Multiple extraction of tooth numbers 22 and 27. 2. Two Quadrants of alveoloplasty   SURGEON: Charlynne Pander, DDS  ASSISTANT: Zettie Pho, (dental assistant)  ANESTHESIA: Monitored anesthesia care per anesthesia team   MEDICATIONS: 1. Ancef 2 g IV prior to invasive dental procedures. 2. Local anesthesia with a total utilization of 2 carpules each containing 34 mg of lidocaine with 0.017 mg of epinephrine as well as 2 carpules each containing 9 mg of bupivacaine with 0.009 mg of epinephrine.  SPECIMENS: There are 2 teeth that were discarded.  DRAINS: None  CULTURES: None  COMPLICATIONS: None   ESTIMATED BLOOD LOSS:  Less than 15 mLs.  INTRAVENOUS FLUIDS: 400 mLs of Lactated ringers solution.  INDICATIONS: The patient was recently diagnosed with severe aortic stenosis.  A dental consultation was then requested to rule out dental infection that may affect the patient's systemic health anticipated heart valve surgery.  The patient was examined and treatment planned for extraction remaining lower teeth with alveoloplasty.   OPERATIVE FINDINGS: Patient was examined operating room number 10.  The teeth were identified for extraction. The patient was noted be affected by chronic periodontitis, tooth mobility.   DESCRIPTION OF PROCEDURE: Patient was brought to the main operating room number 10. Patient was then placed in the supine position on the operating table. Monitored anesthesia care was then  induced per the anesthesia team. The patient was then prepped and draped in the usual manner for dental medicine procedure. A timeout was performed. The patient was identified and procedures were verified. The oral cavity was then thoroughly examined with the findings noted above. The patient was then ready for dental medicine procedure as follows:  Local anesthesia was then administered sequentially with a total utilization of 2 carpules each containing 34 mg of lidocaine with 0.017 mg of epinephrine as well as 2 carpules  each containing 9 mg bupivacaine with 0.009 mg of epinephrine.  At this point time, the mandibular quadrants were approached. The patient was given bilateral inferior alveolar nerve blocks and long buccal nerve blocks utilizing the bupivacaine with epinephrine. Further infiltration was then achieved utilizing the lidocaine with epinephrine. A 15 blade incision was then made from the distal of number 20 and extended to the mesial numbers 24. A second 15 blade incision was made from the distal of #29 and extended the mesial #25 .  A surgical flap was then carefully reflected. Tooth numbers 22 and 27 were then removed utilizing a 151 forceps without complications. Alveoloplasty was then performed utilizing a rongeurs and bone file. The tissues were approximated and trimmed appropriately. The surgical sites were then irrigated with copious amounts of sterile saline. The surgical site was then closed from the distal of 20 and extended the mesial numbers 24 utilizing 3-0 chromic gut suture in a continuous interrupted suture technique x1. The mandibular right surgical site was then closed from the distal of #29  and extended the mesial #25 utilizing 3-0 chromic gut suture in a continuous interrupted suture technique x1.  At this point time, the entire mouth was irrigated with copious amounts of sterile saline. The patient was exam for complications, seeing none, the dental medicine procedure was  deemed to be complete. A series of 4 x 4 gauze were placed in the mouth to aid hemostasis. The patient was then handed over to the anesthesia team for final disposition. After an appropriate amount of time, the patient was extubated and taken to the postanesthsia care unit with stable vital signs and a good condition. All counts were correct for the dental medicine procedure. Patient to be seen approximately 10 days for evaluation for suture removal. The plan is to proceed with aortic valve replacement along with coronary artery bypass graft with Dr. Cornelius Moras on 09/11/2011 pending any oral complications.   Charlynne Pander, DDS.

## 2011-08-29 NOTE — Progress Notes (Signed)
PRE OPERATIVE NOTE:  08/29/2011 Jaclyn [name] [id number]  VITALS: BP 128/74  Pulse 92  Temp 98 F (36.7 C) (Oral)  Resp 18  SpO2 97%  Lab Results  Component Value Date   WBC 9.1 08/28/2011   HGB 12.9 08/29/2011   HCT 38.0 08/29/2011   MCV 84.3 08/28/2011   PLT 330 08/28/2011   BMET    Component Value Date/Time   NA 137 08/29/2011 0811   K 5.2* 08/29/2011 0811   CL 101 08/28/2011 1551   CO2 27 08/28/2011 1551   GLUCOSE 195* 08/29/2011 0811   BUN 22 08/28/2011 1551   CREATININE 1.11* 08/28/2011 1551   CALCIUM 10.5 08/28/2011 1551   GFRNONAA 50* 08/28/2011 1551   GFRAA 58* 08/28/2011 1551    Jaclyn [name] presents for extraction of remaining teeth with alveoloplasty in the operating room today. She denies any acute medical or dental changes. She was seen by Dr. Cornelius Moras yesterday with plans for aortic valve replacement and coronary artery bypass graft on 09/11/2011. We discussed the risks, benefits, complications of proceeding with dental treatment today. Patient accepts risks and agrees to proceed with treatment as planned.   SUBJECTIVE: No acute medical or dental changes.  EXAM: No changes noted from initial consultation date.  ASSESSMENT: Patient has two remaining lower teeth present with chronic periodontitis and tooth mobility.  PLAN: Proceed with extractions with alveoloplasty in the operating room today   Charlynne Pander, DDS

## 2011-08-29 NOTE — Anesthesia Preprocedure Evaluation (Addendum)
Anesthesia Evaluation  Patient identified by MRN, date of birth, ID band Patient awake    Reviewed: Allergy & Precautions, H&P , NPO status , Patient's Chart, lab work & pertinent test results  Airway Mallampati: II TM Distance: >3 FB Neck ROM: Full    Dental  (+) Edentulous Upper, Poor Dentition, Loose, Missing and Dental Advisory Given   Pulmonary shortness of breath,    Pulmonary exam normal       Cardiovascular hypertension, Pt. on medications + CAD + Valvular Problems/Murmurs AS     Neuro/Psych Seizures -,  PSYCHIATRIC DISORDERS Depression    GI/Hepatic GERD-  Medicated and Controlled,  Endo/Other  Well Controlled, Type 1, Insulin DependentHypothyroidism   Renal/GU      Musculoskeletal   Abdominal Normal abdominal exam  (+)   Peds  Hematology   Anesthesia Other Findings   Reproductive/Obstetrics                           Anesthesia Physical Anesthesia Plan  ASA: III  Anesthesia Plan: General   Post-op Pain Management:    Induction: Intravenous  Airway Management Planned: Nasal Cannula  Additional Equipment:   Intra-op Plan:   Post-operative Plan: Extubation in OR  Informed Consent: I have reviewed the patients History and Physical, chart, labs and discussed the procedure including the risks, benefits and alternatives for the proposed anesthesia with the patient or authorized representative who has indicated his/her understanding and acceptance.   Dental advisory given  Plan Discussed with: CRNA, Anesthesiologist and Surgeon  Anesthesia Plan Comments:        Anesthesia Quick Evaluation

## 2011-08-29 NOTE — Transfer of Care (Signed)
Immediate Anesthesia Transfer of Care Note  Patient: Jaclyn [name]  Procedure(s) Performed: Procedure(s) (LRB): MULTIPLE EXTRACION WITH ALVEOLOPLASTY (N/A)  Patient Location: PACU  Anesthesia Type: MAC  Level of Consciousness: awake, alert  and oriented  Airway & Oxygen Therapy: Patient Spontanous Breathing and Patient connected to nasal cannula oxygen  Post-op Assessment: Report given to PACU RN  Post vital signs: Reviewed and stable  Complications: No apparent anesthesia complications

## 2011-08-29 NOTE — Preoperative (Signed)
Beta Blockers   Reason not to administer Beta Blockers:Not Applicable 

## 2011-08-29 NOTE — Progress Notes (Signed)
Pt has insulin pump. She is awake and alert and presently wants to keep rate where it is at--aware CBG 156, was 177 pre op.

## 2011-08-29 NOTE — Anesthesia Postprocedure Evaluation (Signed)
  Anesthesia Post-op Note  Patient: Jaclyn [name]  Procedure(s) Performed: Procedure(s) (LRB): MULTIPLE EXTRACION WITH ALVEOLOPLASTY (N/A)  Patient Location: PACU  Anesthesia Type: MAC  Level of Consciousness: awake, alert , oriented and patient cooperative  Airway and Oxygen Therapy: Patient Spontanous Breathing and Patient connected to nasal cannula oxygen  Post-op Pain: none  Post-op Assessment: Post-op Vital signs reviewed, Patient's Cardiovascular Status Stable, Respiratory Function Stable, Patent Airway, No signs of Nausea or vomiting and Pain level controlled  Post-op Vital Signs: stable  Complications: No apparent anesthesia complications

## 2011-08-30 ENCOUNTER — Encounter (HOSPITAL_COMMUNITY): Payer: Self-pay | Admitting: Dentistry

## 2011-08-30 DIAGNOSIS — Z01818 Encounter for other preprocedural examination: Secondary | ICD-10-CM

## 2011-08-30 DIAGNOSIS — K0530 Chronic periodontitis, unspecified: Secondary | ICD-10-CM

## 2011-09-02 ENCOUNTER — Encounter (HOSPITAL_COMMUNITY): Payer: Medicare Other

## 2011-09-02 ENCOUNTER — Other Ambulatory Visit (HOSPITAL_COMMUNITY): Payer: Medicare Other

## 2011-09-07 NOTE — Pre-Procedure Instructions (Addendum)
20 Jaclyn [name]   09/07/2011   Your procedure is scheduled on: Wednesday, July 31st    Report to [facility] Stay [facility] at 6:15 AM. Remember, Short Stay is now              Located on the 3rd floor  (3700)                                                                                                                      Call this number if you have problems the morning of surgery: [phone]   Remember:   Do not eat food:After Midnight Tuesday.                                                                                                             Take these medicines the morning of surgery with A SIP OF WATER: Levothyroxine   Do not wear jewelry, make-up or nail polish.   Do not wear lotions, powders, or perfumes. You may NOT wear deodorant.  Do not shave 48 hours prior to surgery. Men may shave face and neck.   Do not bring valuables to the hospital.  Contacts, dentures or bridgework may not be worn into surgery.  Leave suitcase in the car. After surgery it may be brought to your room.  For patients admitted to the hospital, checkout time is 11:00 AM the day of discharge.   Patients discharged the day of surgery will not be allowed to drive home.  Name and phone number of your driver:    Special Instructions: CHG Shower Use Special Wash: 1/2 bottle night before surgery and 1/2 bottle morning of surgery.   Please read over the following fact sheets that you were given: Pain Booklet, Coughing and Deep Breathing, Blood Transfusion Information, Open Heart Packet and Surgical Site Infection Prevention              Make sure you bring the light blue booklet back to the hospital, on the day             Of surgery.

## 2011-09-09 ENCOUNTER — Ambulatory Visit (HOSPITAL_COMMUNITY): Admission: RE | Admit: 2011-09-09 | Payer: Medicare Other | Source: Ambulatory Visit

## 2011-09-09 ENCOUNTER — Encounter (HOSPITAL_COMMUNITY): Payer: Self-pay

## 2011-09-09 ENCOUNTER — Encounter (HOSPITAL_COMMUNITY)
Admit: 2011-09-09 | Discharge: 2011-09-09 | Disposition: A | Discharge status: Home / Self Care | Payer: Medicare Other | Attending: Thoracic Surgery (Cardiothoracic Vascular Surgery) | Admitting: Thoracic Surgery (Cardiothoracic Vascular Surgery)

## 2011-09-09 ENCOUNTER — Encounter: Payer: Self-pay | Admitting: Thoracic Surgery (Cardiothoracic Vascular Surgery)

## 2011-09-09 ENCOUNTER — Ambulatory Visit (INDEPENDENT_AMBULATORY_CARE_PROVIDER_SITE_OTHER): Payer: Medicare Other | Admitting: Thoracic Surgery (Cardiothoracic Vascular Surgery)

## 2011-09-09 ENCOUNTER — Ambulatory Visit (HOSPITAL_COMMUNITY)
Admission: RE | Admit: 2011-09-09 | Discharge: 2011-09-09 | Disposition: A | Discharge status: Home / Self Care | Payer: Medicare Other | Source: Ambulatory Visit | Attending: Thoracic Surgery (Cardiothoracic Vascular Surgery) | Admitting: Thoracic Surgery (Cardiothoracic Vascular Surgery)

## 2011-09-09 VITALS — BP 153/69 | HR 77 | Temp 98.1°F | Resp 18 | Wt 182.0 lb

## 2011-09-09 VITALS — BP 146/64 | HR 80 | Resp 18 | Ht 67.0 in | Wt 182.0 lb

## 2011-09-09 DIAGNOSIS — Z01818 Encounter for other preprocedural examination: Secondary | ICD-10-CM | POA: Insufficient documentation

## 2011-09-09 DIAGNOSIS — I2510 Atherosclerotic heart disease of native coronary artery without angina pectoris: Secondary | ICD-10-CM | POA: Insufficient documentation

## 2011-09-09 DIAGNOSIS — Z01810 Encounter for preprocedural cardiovascular examination: Secondary | ICD-10-CM

## 2011-09-09 DIAGNOSIS — I359 Nonrheumatic aortic valve disorder, unspecified: Secondary | ICD-10-CM

## 2011-09-09 DIAGNOSIS — Z01812 Encounter for preprocedural laboratory examination: Secondary | ICD-10-CM | POA: Insufficient documentation

## 2011-09-09 DIAGNOSIS — I350 Nonrheumatic aortic (valve) stenosis: Secondary | ICD-10-CM

## 2011-09-09 LAB — URINALYSIS, ROUTINE W REFLEX MICROSCOPIC
Bilirubin Urine: NEGATIVE
Nitrite: NEGATIVE
Protein, ur: NEGATIVE mg/dL
Specific Gravity, Urine: 1.009 (ref 1.005–1.030)
Urobilinogen, UA: 0.2 mg/dL (ref 0.0–1.0)

## 2011-09-09 LAB — BLOOD GAS, ARTERIAL
Bicarbonate: 21.7 mEq/L (ref 20.0–24.0)
Patient temperature: 98.6
TCO2: 22.9 mmol/L (ref 0–100)
pH, Arterial: 7.378 (ref 7.350–7.450)

## 2011-09-09 LAB — SURGICAL PCR SCREEN: Staphylococcus aureus: POSITIVE — AB

## 2011-09-09 LAB — COMPREHENSIVE METABOLIC PANEL
Alkaline Phosphatase: 76 U/L (ref 39–117)
BUN: 13 mg/dL (ref 6–23)
CO2: 20 mEq/L (ref 19–32)
Chloride: 106 mEq/L (ref 96–112)
GFR calc Af Amer: 64 mL/min — ABNORMAL LOW (ref >90)
GFR calc non Af Amer: 55 mL/min — ABNORMAL LOW (ref >90)
Glucose, Bld: 68 mg/dL — ABNORMAL LOW (ref 70–99)
Potassium: 4.8 mEq/L (ref 3.5–5.1)
Total Bilirubin: 0.4 mg/dL (ref 0.3–1.2)

## 2011-09-09 LAB — CBC
HCT: 38.3 % (ref 36.0–46.0)
Hemoglobin: 12.7 g/dL (ref 12.0–15.0)
MCHC: 33.2 g/dL (ref 30.0–36.0)
WBC: 7.9 10*3/uL (ref 4.0–10.5)

## 2011-09-09 NOTE — Progress Notes (Signed)
Spoke with Boneta Lucks, nurse diabetic specialist. Reported that pt. was instructed to leave her  Insulin basal rate infusing morning of surgery.

## 2011-09-09 NOTE — Progress Notes (Signed)
Pre-op Cardiac Surgery  Carotid Findings:  Performed at [facility] on 06/24/11. Results: 0-39% Bilateral internal carotid artery stenosis.  Upper Extremity Right Left  Brachial Pressures 163 159  Radial Waveforms Tri Tri  Ulnar Waveforms Tri Tri  Palmar Arch (Allen's Test) Normal with radial compression, obliterates with ulnar compression Normal with radial compression, decreases >50% with ulnar compression     Lower  Extremity Right Left  Dorsalis Pedis    Anterior Tibial 153, Tri 135, Tri  Posterior Tibial 156, Tri 120, Tri  Ankle/Brachial Indices 0.96, Normal 0.83, Mild disease    Farrel Demark, RDMS

## 2011-09-09 NOTE — Progress Notes (Signed)
[address]            [place] [name] [address]          [phone]     CARDIOTHORACIC SURGERY OFFICE NOTE  Referring Provider is Wendall Stade, MD PCP is Robb Matar, MD   HPI:  Patient returns for followup before surgery on 09/11/2011.  She was last seen here in the office on 08/28/2011.  Since then she underwent dental extraction which was uncomplicated. She also reports that her blood sugars have been under excellent control since she changed her insulin in the insulin pump. He reports no new complaints, although she did have a brief syncopal episode last week while she was eating.   Current Outpatient Prescriptions  Medication Sig Dispense Refill  . aspirin 325 MG EC tablet Take 325 mg by mouth daily.      . chlorhexidine (PERIDEX) 0.12 % solution Use as directed 15 mLs in the mouth or throat 2 (two) times daily. Rinse with 15 mls twice daily for 30 seconds. Use after breakfast and at bedtime. Spit out excess. Do not swallow.      . cholecalciferol (VITAMIN D) 1000 UNITS tablet Take 1,000 Units by mouth daily.      . Insulin Human (INSULIN PUMP) 100 unit/ml SOLN Inject into the skin. humalog- basal rate continuously & boluses as appropriate to her carbs & blood glucose      . levothyroxine (SYNTHROID, LEVOTHROID) 112 MCG tablet Take 112 mcg by mouth daily before breakfast.       . losartan (COZAAR) 50 MG tablet Take 50 mg by mouth daily before breakfast.       . magnesium hydroxide (MILK OF MAGNESIA) 400 MG/5ML suspension Take 30 mLs by mouth daily as needed. For constipation      . Sennosides (EX-LAX PO) Take 2 tablets by mouth daily as needed. For constipation       No current facility-administered medications for this visit.   Facility-Administered Medications Ordered in Other Visits  Medication Dose Route Frequency Provider Last Rate Last Dose  . albuterol (PROVENTIL) (5 MG/ML) 0.5% nebulizer solution 2.5 mg  2.5 mg Nebulization Once  Purcell Nails, MD          Physical Exam:   BP 146/64  Pulse 80  Resp 18  Ht 5\' 7"  (1.702 m)  Wt 182 lb (82.555 kg)  BMI 28.51 kg/m2  SpO2 98%  General:  Patient looks good overall  Chest:   Clear and symmetrical  CV:   RRR with a Systolic murmur  Incisions:  n/a  Abdomen:  soft  Extremities:  warm  Diagnostic Tests:  n/a   Impression:  Severe symptomatic aortic stenosis and coronary artery disease.  Plan:  We plan to proceed with elective aortic valve replacement and coronary artery bypass grafting on Wednesday this week. We again reviewed the indications, risks, and potential benefits of surgery. All of her questions been addressed. Both she and her family understand that her procedure may be somewhat technically challenging and take longer than typical because of the fact that she has undergone radiation therapy to the mediastinum in the past and because her aortic root is relatively small in size. She has been instructed not to take any of her regular medications on the morning of surgery other than Synthroid.    Salvatore Decent. Cornelius Moras, MD 09/09/2011 9:37 AM

## 2011-09-09 NOTE — Patient Instructions (Signed)
Do not take any appear regular medications on the morning of surgery other than Synthroid

## 2011-09-09 NOTE — Pre-Procedure Instructions (Addendum)
20 Jaclyn [name]  09/09/2011   Your procedure is scheduled on:  09/11/2011  Report to [facility] at 6:30 AM.  Call this number if you have problems the morning of surgery: [phone]   Remember:   Do not eat food or drink:After Midnight.- Tuesday      Take these medicines the morning of surgery with A SIP OF WATER: synthroid, Continue Basal rate on Insulin pump   Do not wear jewelry, make-up or nail polish.  Do not wear lotions, powders, or perfumes. You may wear deodorant.  Do not shave 48 hours prior to surgery. Men may shave face and neck.  Do not bring valuables to the hospital.  Contacts, dentures or bridgework may not be worn into surgery.  Leave suitcase in the car. After surgery it may be brought to your room.  For patients admitted to the hospital, checkout time is 11:00 AM the day of discharge.   Patients discharged the day of surgery will not be allowed to drive home.  Name and phone number of your driver: /w daughter   Special Instructions: CHG Shower Use Special Wash: 1/2 bottle night before surgery and 1/2 bottle morning of surgery.   Please read over the following fact sheets that you were given: Pain Booklet, Coughing and Deep Breathing, Blood Transfusion Information, Open Heart Packet, MRSA Information and Surgical Site Infection Prevention

## 2011-09-09 NOTE — Progress Notes (Signed)
Spoke with A. Zelenak,PAC, reported pt.'s Insulin Pump. Pt. Instructed to continue Basal rate on PUMP, not to stop Insulin  Basal rate infusion.

## 2011-09-10 ENCOUNTER — Ambulatory Visit (HOSPITAL_COMMUNITY): Payer: Self-pay | Admitting: Dentistry

## 2011-09-10 ENCOUNTER — Encounter (HOSPITAL_COMMUNITY): Payer: Self-pay | Admitting: Dentistry

## 2011-09-10 VITALS — BP 162/70 | HR 78

## 2011-09-10 DIAGNOSIS — K08199 Complete loss of teeth due to other specified cause, unspecified class: Secondary | ICD-10-CM

## 2011-09-10 DIAGNOSIS — K0820 Unspecified atrophy of edentulous alveolar ridge: Secondary | ICD-10-CM

## 2011-09-10 DIAGNOSIS — K08109 Complete loss of teeth, unspecified cause, unspecified class: Secondary | ICD-10-CM

## 2011-09-10 LAB — HEMOGLOBIN A1C: Hgb A1c MFr Bld: 6.8 % — ABNORMAL HIGH (ref <5.7)

## 2011-09-10 MED ORDER — DEXMEDETOMIDINE HCL IN NACL 400 MCG/100ML IV SOLN
0.1000 ug/kg/h | INTRAVENOUS | Status: AC
  Administered 2011-09-11: .2 ug/kg/h via INTRAVENOUS
  Filled 2011-09-10: qty 100

## 2011-09-10 MED ORDER — METOPROLOL TARTRATE 12.5 MG HALF TABLET
12.5000 mg | ORAL_TABLET | Freq: Once | ORAL | Status: AC
  Administered 2011-09-11: 12.5 mg via ORAL
  Filled 2011-09-10: qty 1

## 2011-09-10 MED ORDER — DOPAMINE-DEXTROSE 3.2-5 MG/ML-% IV SOLN
2.0000 ug/kg/min | INTRAVENOUS | Status: DC
  Filled 2011-09-10: qty 250

## 2011-09-10 MED ORDER — TRANEXAMIC ACID (OHS) BOLUS VIA INFUSION
15.0000 mg/kg | INTRAVENOUS | Status: AC
  Administered 2011-09-11: 1239 mg via INTRAVENOUS
  Filled 2011-09-10: qty 1239

## 2011-09-10 MED ORDER — EPINEPHRINE HCL 1 MG/ML IJ SOLN
0.5000 ug/min | INTRAVENOUS | Status: DC
  Filled 2011-09-10: qty 4

## 2011-09-10 MED ORDER — POTASSIUM CHLORIDE 2 MEQ/ML IV SOLN
80.0000 meq | INTRAVENOUS | Status: DC
  Filled 2011-09-10: qty 40

## 2011-09-10 MED ORDER — SODIUM CHLORIDE 0.9 % IV SOLN
INTRAVENOUS | Status: AC
  Administered 2011-09-11: 9.5 [IU]/h via INTRAVENOUS
  Administered 2011-09-11: 7.9 [IU]/h via INTRAVENOUS
  Filled 2011-09-10: qty 1

## 2011-09-10 MED ORDER — PHENYLEPHRINE HCL 10 MG/ML IJ SOLN
30.0000 ug/min | INTRAVENOUS | Status: DC
  Filled 2011-09-10: qty 2

## 2011-09-10 MED ORDER — CHLORHEXIDINE GLUCONATE 4 % EX LIQD: 30.0000 mL | CUTANEOUS | Status: DC

## 2011-09-10 MED ORDER — NITROGLYCERIN IN D5W 200-5 MCG/ML-% IV SOLN
2.0000 ug/min | INTRAVENOUS | Status: AC
  Administered 2011-09-11: 10 ug/min via INTRAVENOUS
  Filled 2011-09-10: qty 250

## 2011-09-10 MED ORDER — TRANEXAMIC ACID 100 MG/ML IV SOLN
1.5000 mg/kg/h | INTRAVENOUS | Status: AC
  Administered 2011-09-11: 1.5 mg/kg/h via INTRAVENOUS
  Filled 2011-09-10: qty 25

## 2011-09-10 MED ORDER — DEXTROSE 5 % IV SOLN
750.0000 mg | INTRAVENOUS | Status: DC
  Filled 2011-09-10: qty 750

## 2011-09-10 MED ORDER — TRANEXAMIC ACID (OHS) PUMP PRIME SOLUTION
2.0000 mg/kg | INTRAVENOUS | Status: DC
  Filled 2011-09-10: qty 1.65

## 2011-09-10 MED ORDER — VANCOMYCIN HCL 1000 MG IV SOLR
1250.0000 mg | INTRAVENOUS | Status: AC
  Administered 2011-09-11: 1250 mg via INTRAVENOUS
  Filled 2011-09-10: qty 1250

## 2011-09-10 MED ORDER — MAGNESIUM SULFATE 50 % IJ SOLN
40.0000 meq | INTRAMUSCULAR | Status: DC
  Filled 2011-09-10: qty 10

## 2011-09-10 MED ORDER — CEFUROXIME SODIUM 1.5 G IJ SOLR
1.5000 g | INTRAMUSCULAR | Status: AC
  Administered 2011-09-11: 1.5 g via INTRAVENOUS
  Administered 2011-09-11: 750 g via INTRAVENOUS
  Filled 2011-09-10: qty 1.5

## 2011-09-10 MED ORDER — SODIUM BICARBONATE 8.4 % IV SOLN
INTRAVENOUS | Status: AC
  Administered 2011-09-11: 11:00:00
  Filled 2011-09-10 (×2): qty 2.5

## 2011-09-10 NOTE — Progress Notes (Signed)
POST OPERATIVE NOTE:  09/10/2011 Jaclyn [name] [id number]  VITALS: BP 162/70  Pulse 78  Patient is status post multiple extractions with alveoloplasty in the operating room on 08/29/2011. Patient saw Dr. Tressie Stalker yesterday and is planning to proceed with heart valve surgery tomorrow morning.  SUBJECTIVE: Patient denies having any dental pain or problems with healing. There are no sutures that remain. Patient had minimal discomfort by report. Patient is still using chlorhexidine rinses twice daily.  EXAM: There is no sign of infection, heme, or ooze. Patient would generalized primary closure and normal healing. One suture remained that was removed without complications after a 30 second chlorhexidine rinse. Patient is still wearing upper complete denture without problems. Patient was cautioned about traumatizing the lower edentulous alveolar ridge.  ASSESSMENT: Post operative course is consistent with dental procedures performed the operating room on 08/29/2011.   PLAN: 1. Patient is to continue chlorhexidine rinses twice daily. 2. Patient is now cleared to proceed with heart valve surgery with Dr. Cornelius Moras tomorrow. 3. Return to clinic for evaluation of healing and approximately one month. Will discuss fabrication of upper lower complete dentures by the dentist of her choice at that time. 4. Patient to call if problems arise with her mouth before then.   Charlynne Pander, DDS

## 2011-09-10 NOTE — Consult Note (Signed)
Anesthesia Chart Review: Patient is a 66 year old female scheduled for AVR/CABG by Dr. Cornelius Moras on 09/11/11.  Other medical history reviewed and includes lung cancer, radiation fibrosis of the lung, and DM with an insulin pump. She plans to keep her pump at the basal rate while she is NPO.  She is s/p dental extractions on 08/29/11.  Labs noted.   CXR on 08/28/11 showed stable chest x-ray with scarring in the right paratracheal region and right apex. No active lung disease.   PFTs from 08/27/11 demonstrated moderate to severe COPD. Dr. Cornelius Moras felt this would increase her surgery risk significantly, but did not feel it was severe enough to preclude surgical treatment for AS/CAD.   EKG on 09/09/11 showed NSR.   Cardiac cath on 08/21/11 showed:  Coronary Arteries:  Right dominant with no anomalies  LM: 70% Diffuse with caliber of LM much smaller than proximal LAD.  LAD: Calcified 60% distal discrete stenosis  D1: 80% mid vessel stenosis  Circumflex: calcified primarily OM only no significant disease  OM1: normal  RCA: 30% proximal Heavy calcification of proximal and mid vessel 70% mid  PDA: 30-40% mid  PLA: normal  Ventriculography: EF: 55 %, Apical hypokinesis  Hemodynamics:  Aortic Pressure: 175 83 mmHg LV Pressure: 201 14 15 MmHg  Right Heart Catheterization:  Mean RA: 5 mmHg  RV: 31/4 mmHg  PA: 29/10 mmHg with a mean of 18 mmHg  PCWP: 10 mmHg  Fick Cardiac Output: 6.4 L/minute, 3.3 L/minute/M2  Aortic Valve mean gradient 19 mmHg Peak to Peak gradient 26 mmHg  Hakki AVA: .8 cm2   Echo on 06/20/11 showed:  - Left ventricle: The cavity size was normal. Wall thickness was normal. Systolic function was normal. The estimated ejection fraction was in the range of 55% to 60%. Wall motion was normal; there were no regional wall motion abnormalities. Features are consistent with a pseudonormal left ventricular filling pattern, with concomitant abnormal relaxation and increased filling pressure  (grade 2 diastolic dysfunction). - Aortic valve: Trileaflet; moderately calcified leaflets. There was moderate stenosis. Mild regurgitation. Mean gradient: 29mm Hg (S). Peak gradient: 46mm Hg (S). Valve area: 1.23cm^2(VTI). - Mitral valve: Mild regurgitation. - Right ventricle: The cavity size was normal. Systolic function was normal. - Tricuspid valve: Peak RV-RA gradient: 38mm Hg (S). - Pulmonary arteries: PA peak pressure: 43mm Hg (S). - Inferior vena cava: The vessel was normal in size; the respirophasic diameter changes were in the normal range (= 50%); findings are consistent with normal central venous pressure.  Carotid duplex on 06/24/11 showed 0-39% ICA stenosis.  Anticipate she can proceed as planned.  Shonna Chock, PA-C

## 2011-09-10 NOTE — H&P (Signed)
CARDIOTHORACIC SURGERY HISTORY AND PHYSICAL EXAM  Referring Provider is Wendall Stade, MD PCP is Robb Matar, MD  Chief Complaint   Patient presents with   .  Coronary Artery Disease     eval and treat...cathed 08/21/11, echo 06/20/11   .  Aortic Stenosis     HPI:  Patient is a 66 year old female from [place], [place] referred for evaluation of newly diagnosed aortic stenosis and coronary artery disease.  The patient has multiple medical problems including history of hypertension, hyperlipidemia, long-standing type 1 diabetes mellitus, previous history of tobacco abuse and lung cancer. In the past the patient was told that she had a heart murmur and this was attributed to mitral valve prolapse. The patient also is not certain whether or not she may have had rheumatic fever during her youth.  Her brother had rheumatic fever. The patient recently was referred for cardiac evaluation do to the development of frequent palpitations or dizzy spells, atypical chest pain, and exertional shortness of breath. The patient states that she has had mild exertional shortness of breath dating back to her history of the heavy tobacco abuse and lung cancer. She was diagnosed with small cell carcinoma of the lung approximately 12-14 years ago.  This was treated with chemo and radiation therapy. She quit smoking at that time.  Since then she has remained stable until over the last year or so the patient has developed somewhat more noticeable exertional shortness of breath and progressive fatigue. The patient has also developed frequent tachypalpitations and some atypical episodes of chest pressure that do not seem to be related to physical activity. She has developed significant fatigue and notes that she gets tired much more easily than she used to. She has had several dizzy spells without syncope. She was evaluated by Dr. Eden Emms and underwent 2-D echocardiogram confirming the presence of moderate  to severe aortic stenosis with normal left ventricular systolic function.  Stress myoview was performed demonstrating stress-induced EKG changes and ischemia on nuclear imaging.  Elective left and right heart catheterization confirmed the presence of severe aortic stenosis and also documented the presence of severe left main disease and three-vessel coronary artery disease. The patient was referred for elective surgical consultation.    The patient reports moderate exertional shortness of breath. She gets short of breath quite easily if she walks up a flight of stairs or tries to do something strenuous. She does not get short of breath with normal activity around the house. She specifically denies resting shortness of breath, PND, orthopnea. She has mild chronic a lateral lower extremity edema. She has frequent tachypalpitations associated with some atypical chest discomfort. This does not seem to be related to physical activity. She has intermittent dizzy spells without syncope.   Past Medical History  Diagnosis Date  . Palpitation   . Allergic rhinitis   . Dermatitis   . Fibrocystic disease of breast   . Callus   . Reflux esophagitis   . Corns   . Depression   . Dizziness   . Fatigue   . Foot pain   . Murmur   . Gastroparesis   . Hematuria   . Hyperlipidemia   . Hypothyroidism   . Joint pain   . Menopause   . Microalbuminuria   . Myalgia   . Osteoporosis   . Plantar fasciitis   . Keratosis   . SOB (shortness of breath)   . Vitamin d deficiency   .  Chest pain   . Orthostatic hypotension   . Radiation fibrosis of lung   . 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 Aortic stenosis, severe 08/22/2011  . Coronary artery disease 08/21/2011    Cath 08/21/2011  . S/P radiation therapy 08/22/2011    Radiation therapy to chest and mediastinum for small cell carcinoma of the lung  . Family history of  anesthesia complication     family history of N&V  . Anemia     blood transfusion- 6 yrs. ago by Dr. Arline Asp   . HTN (hypertension)     Dr. Eden Emms at [facility] takes care of cardiac needs, last stress test- 06/2011  . GERD (gastroesophageal reflux disease)   . Seizures     32 yrs. ago, was on phenobarbital for sometime, off it for 20 yrs.   . Peripheral neuropathy     neuropathy   . 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 DM (diabetes mellitus)     type I - treated with insulin pump /w x63yrs  . Arthritis     knees    Past Surgical History  Procedure Date  . Thyroidectomy     partial- R   . Total abdominal hysterectomy   . Shoulder surgery     bilateral- RCR-   . Elbow surgery     bilateral  . Carpal tunnel release     bilateral  . Cardiac catheterization     08/21/2011  . Multiple extractions with alveoloplasty 08/29/2011    Procedure: MULTIPLE EXTRACION WITH ALVEOLOPLASTY;  Surgeon: Charlynne Pander, DDS;  Location: [facility] OR;  Service: Oral Surgery;  Laterality: N/A;  extraction of teeth # 22, 27 with alveoloplasty    Family History  Problem Relation Age of Onset  . Alzheimer's disease    . Anemia    . Cancer    . Cirrhosis    . Colon cancer    . Coronary artery disease    . Heart attack    . Heart disease    . Heart failure Mother   . Hypertension Mother   . Thyroid disease Mother   . Cancer Father     Social History History  Substance Use Topics  . Smoking status: Former Smoker -- 2.0 packs/day for 43 years    Types: Cigarettes    Quit date: 08/27/1997  . Smokeless tobacco: Former Neurosurgeon    Quit date: 08/27/1997  . Alcohol Use: No    Prior to Admission medications   Medication Sig Start Date End Date Taking? Authorizing Provider  aspirin 325 MG EC tablet Take 325 mg by mouth daily.   Yes Historical Provider, MD    chlorhexidine (PERIDEX) 0.12 % solution Use as directed 15 mLs in the mouth or throat 2 (two) times daily. Rinse with 15 mls twice daily for 30 seconds. Use after breakfast and at bedtime. Spit out excess. Do not swallow. 08/26/11 09/09/11 Yes Charlynne Pander, DDS  cholecalciferol (VITAMIN D) 1000 UNITS tablet Take 1,000 Units by mouth daily.   Yes Historical Provider, MD  Insulin Human (INSULIN PUMP) 100 unit/ml SOLN Inject into the skin. humalog- basal rate continuously & boluses as appropriate to her carbs & blood glucose   Yes Historical Provider, MD  levothyroxine (SYNTHROID, LEVOTHROID) 112 MCG tablet Take 112 mcg by mouth daily before breakfast.    Yes Historical Provider, MD  losartan (COZAAR) 50 MG tablet Take 50 mg by mouth daily before breakfast.    Yes Historical Provider, MD  magnesium hydroxide (MILK OF MAGNESIA) 400 MG/5ML suspension Take 30 mLs by mouth daily as needed. For constipation   Yes Historical Provider, MD  Sennosides (EX-LAX PO) Take 2 tablets by mouth daily as needed. For constipation   Yes Historical Provider, MD    Allergies  Allergen Reactions  . Statins     Joints ache...crestor...lipitor...crestor   Review of Systems:             General:                      normal appetite, decreased energy, no weight gain/loss             Respiratory:                no cough, no wheezing, no hemoptysis, no pain with inspiration or cough, + stable exertional shortness of breath             Cardiac:                      + atypcial chest pain, + exertional SOB, no resting SOB, no PND, no orthopnea, + LE edema, + palpitations, no syncope             GI:                                no difficulty swallowing, no hematochezia, no hematemesis, no melena, + constipation, no diarrhea               GU:                              no dysuria, no urgency, no frequency               Musculoskeletal:         no arthritis, no arthralgia               Vascular:                     no pain  suggestive of claudication               Neuro:                         no symptoms suggestive of TIA's, no seizures, + headaches, + peripheral neuropathy both lower legs             Endocrine:                   Type I DM dx'd age 62 - uses insulin pump - blood glucose control dramatically improved after recent change of insulin             HEENT:                       Full set upper dentures, partial lower plate, remaining teeth recently extracted             Psych:  no anxiety, + depression                Physical Exam:              BP 149/66  Pulse 82  Resp 16  Ht 5\' 7"  (1.702 m)  Wt 183 lb (83.008 kg)  BMI 28.66 kg/m2  SpO2 96%             General:                      Mildly obese,  well-appearing             HEENT:                       Unremarkable               Neck:                           no JVD, no bruits, no adenopathy               Chest:                         clear to auscultation, symmetrical breath sounds, no wheezes, no rhonchi               CV:                              RRR, grade III/VI crescendo/decrescendo systolic murmur best LLSB               Abdomen:                    soft, non-tender, no masses               Extremities:                 warm, well-perfused, pulses not palpable, mild bilateral LE edema             Rectal/GU                   Deferred             Neuro:                         Grossly non-focal and symmetrical throughout             Skin:                            Clean and dry, no rashes, no breakdown  Diagnostic Tests:  ------------------------------------------------------------ Transthoracic Echocardiography  Patient: Naziya, [place] MR #: [id number] Study Date: 06/20/2011 Gender: F Age: 1 Height: 170.2cm Weight: 83.5kg BSA: 1.42m^2 Pt. Status: Room:  ORDERING Gweneth Dimitri ATTENDING Marca Ancona, MD PERFORMING [facility] Gainer, [facility] SONOGRAPHER Junious Dresser,  RDCS cc:  ------------------------------------------------------------ LV EF: 55% - 60%  ------------------------------------------------------------ Indications: Aortic stenosis 424.1.  ------------------------------------------------------------ History: PMH: Murmur. Aortic stenosis. Chronic obstructive pulmonary disease. Risk factors: Diabetes mellitus. Dyslipidemia.  ------------------------------------------------------------ Study Conclusions  - Left ventricle: The cavity size was normal. Wall thickness was normal. Systolic function was normal. The estimated ejection fraction was in the range of 55% to 60%. Wall motion was  normal; there were no regional wall motion abnormalities. Features are consistent with a pseudonormal left ventricular filling pattern, with concomitant abnormal relaxation and increased filling pressure (grade 2 diastolic dysfunction). - Aortic valve: Trileaflet; moderately calcified leaflets. There was moderate stenosis. Mild regurgitation. Mean gradient: 29mm Hg (S). Peak gradient: 46mm Hg (S). Valve area: 1.23cm^2(VTI). - Mitral valve: Mild regurgitation. - Right ventricle: The cavity size was normal. Systolic function was normal. - Tricuspid valve: Peak RV-RA gradient: 38mm Hg (S). - Pulmonary arteries: PA peak pressure: 43mm Hg (S). - Inferior vena cava: The vessel was normal in size; the respirophasic diameter changes were in the normal range (= 50%); findings are consistent with normal central venous pressure. Impressions:  - Normal LV size and systolic function, EF 55-60%. Normal RV size and systolic function. Mild pulmonary hypertension. Moderate aortic stenosis, mild aortic insufficiency, mild mitral regurgitation. Transthoracic echocardiography. M-mode, complete 2D, spectral Doppler, and color Doppler. Height: Height: 170.2cm. Height: 67in. Weight: Weight: 83.5kg. Weight: 183.6lb. Body mass index: BMI: 28.8kg/m^2. Body surface area:  BSA: 1.61m^2. Blood pressure: 120/75. Patient status: Outpatient. Location:  Site 3  ------------------------------------------------------------  ------------------------------------------------------------ Left ventricle: The cavity size was normal. Wall thickness was normal. Systolic function was normal. The estimated ejection fraction was in the range of 55% to 60%. Wall motion was normal; there were no regional wall motion abnormalities. Features are consistent with a pseudonormal left ventricular filling pattern, with concomitant abnormal relaxation and increased filling pressure (grade 2 diastolic dysfunction).  ------------------------------------------------------------ Aortic valve: Trileaflet; moderately calcified leaflets. Doppler: There was moderate stenosis. Mild regurgitation. VTI ratio of LVOT to aortic valve: 0.39. Valve area: 1.23cm^2(VTI). Indexed valve area: 0.63cm^2/m^2 (VTI). Peak velocity ratio of LVOT to aortic valve: 0.36. Valve area: 1.12cm^2 (Vmax). Indexed valve area: 0.57cm^2/m^2 (Vmax). Mean gradient: 29mm Hg (S). Peak gradient: 46mm Hg (S).  ------------------------------------------------------------ Aorta: Aortic root: The aortic root was normal in size. Ascending aorta: The ascending aorta was normal in size.  ------------------------------------------------------------ Mitral valve: Doppler: There was no evidence for stenosis. Mild regurgitation. Peak gradient: 5mm Hg (D).  ------------------------------------------------------------ Left atrium: The atrium was normal in size.  ------------------------------------------------------------ Right ventricle: The cavity size was normal. Systolic function was normal.  ------------------------------------------------------------ Pulmonic valve: Structurally normal valve. Cusp separation was normal. Doppler: Transvalvular velocity was within the normal range. No  regurgitation.  ------------------------------------------------------------ Tricuspid valve: Doppler: Trivial regurgitation.  ------------------------------------------------------------ Right atrium: The atrium was normal in size.  ------------------------------------------------------------ Pericardium: There was no pericardial effusion.  ------------------------------------------------------------ Systemic veins: Inferior vena cava: The vessel was normal in size; the respirophasic diameter changes were in the normal range (= 50%); findings are consistent with normal central venous pressure.  ------------------------------------------------------------  2D measurements Normal Doppler measurements Normal Left ventricle Main pulmonary LVID ED, 40.1 mm 43-52 artery chord, Pressure, 43 mm Hg =30 PLAX S LVID ES, 29 mm 23-38 Left ventricle chord, Ea, lat 7.9 cm/s ------ PLAX ann, tiss FS, chord, 28 % >29 DP PLAX E/Ea, lat 14.5 ------ LVPW, ED 8.95 mm ------ ann, tiss 6 IVS/LVPW 1.14 <1.3 DP ratio, ED Ea, med 7.35 cm/s ------ Ventricular septum ann, tiss IVS, ED 10.2 mm ------ DP LVOT E/Ea, med 15.6 ------ Diam, S 20 mm ------ ann, tiss 5 Area 3.14 cm^2 ------ DP Diam 20 mm ------ LVOT Aorta Peak vel, 120 cm/s ------ Root diam, 32 mm ------ S ED VTI, S 33.2 cm ------ Left atrium Peak 6 mm Hg ------ AP dim 36 mm ------ gradient, AP dim 1.85 cm/m^2 <2.2 S index Stroke  vol 104. ml ------ 3 Stroke 53.5 ml/m^2 ------ index Aortic valve Peak vel, 338 cm/s ------ S Mean vel, 258 cm/s ------ S VTI, S 84.5 cm ------ Mean 29 mm Hg ------ gradient, S Peak 46 mm Hg ------ gradient, S VTI ratio 0.39 ------ LVOT/AV Area, VTI 1.23 cm^2 ------ Area index 0.63 cm^2/m ------ (VTI) ^2 Peak vel 0.36 ------ ratio, LVOT/AV Area, Vmax 1.12 cm^2 ------ Area index 0.57 cm^2/m ------ (Vmax) ^2 Regurg PHT 350 ms ------ Mitral valve Peak E vel 115 cm/s ------ Peak A vel  98.2 cm/s ------ Decelerati 162 ms 150-23 on time 0 Peak 5 mm Hg ------ gradient, D Peak E/A 1.2 ------ ratio Tricuspid valve Regurg 308 cm/s ------ peak vel Peak RV-RA 38 mm Hg ------ gradient, S Systemic veins Estimated 5 mm Hg ------ CVP Right ventricle Sa vel, 14.3 cm/s ------ lat ann, tiss DP  ------------------------------------------------------------ Prepared and Electronically Authenticated by  Marca Ancona, MD [id number]:[id number]  Cardiology Nuclear Med Study   BERNARDINA CACHO is a 66 y.o. female MRN : [id number] DOB: 22-Dec-1945   Procedure Date: 06/26/2011   Nuclear Med Background   Indication for Stress Test: Evaluation for Ischemia   History: H/O Chemo; ~15 yrs ago GXT:OK per patient; 06/20/11 EF=55-60%, moderate AS, mild MR   Cardiac Risk Factors: Family History - CAD, History of Smoking, Hypertension, IDDM Type 2 and Lipids   Symptoms: Difficult Historian; Chest/Neck Pain (last episode of chest discomfort was about one month ago), Dizziness, DOE, Near Syncope and Palpitations   Nuclear Pre-Procedure   Caffeine/Decaff Intake: None   NPO After: 7:30am    Lungs: Clear.   IV 0.9% NS with Angio Cath: 22g    IV Site: R Hand   IV Started by: Stanton Kidney, EMT-P    Chest Size (in): 42   Cup Size: C    Height: 5\' 7"  (1.702 m)   Weight: 185 lb (83.915 kg)    BMI: Body mass index is 28.97 kg/(m^2).   Tech Comments: CBG=333 @ 11:30am, per patient.    Nuclear Med Study   1 or 2 day study: 1 day   Stress Test Type: Stress    Reading MD: Willa Rough, MD   Order Authorizing Provider: Charlton Haws, MD    Resting Radionuclide: Technetium 64m Tetrofosmin   Resting Radionuclide Dose: 11.0 mCi    Stress Radionuclide: Technetium 68m Tetrofosmin   Stress Radionuclide Dose: 33.0 mCi    Stress Protocol   Rest HR: 78   Stress HR: 153    Rest BP: 179/64   Stress BP: 196/69    Exercise Time (min): 4:00   METS: 5.2    Predicted Max HR: 153 bpm   % Max HR: 100 bpm   Rate  Pressure Product: [id number]  Dose of Adenosine (mg): n/a   Dose of Lexiscan: n/a mg    Dose of Atropine (mg): n/a   Dose of Dobutamine: n/a mcg/kg/min (at max HR)    Stress Test Technologist: Smiley Houseman, CMA-N   Nuclear Technologist: Domenic Polite, CNMT    Rest Procedure: Myocardial perfusion imaging was performed at rest 45 minutes following the intravenous administration of Technetium 3m Tetrofosmin.   Rest ECG: No acute changes with occasional PVC's.   Stress Procedure: The patient exercised on the treadmill utilizing the Bruce protocol for four minutes. She then stopped due to significant dyspnea with O2 SAT of 95% and marked ST-T wave changes that continued late into recovery. She denied any chest pain. Technetium  17m Tetrofosmin was injected at peak exercise and myocardial perfusion imaging was performed after a brief delay.   Images and EKG's were discussed with Dr. Johney Frame (DOD) and Dr. Myrtis Ser (Reader).   Stress ECG: The patient walked on the treadmill for 4 minutes. She developed significant shortness of breath. There was no significant chest pain. She develops significant EKG changes with1-24mm downsloping ST depression. There were no significant arrhythmias.   QPS  Raw Data Images: Normal; no motion artifact; normal heart/lung ratio.   Stress Images: There is moderately severe decreased uptake in a moderate area of affecting the apical inferolateral segment, apical anterolateral segment and the apical cap.   Rest Images: There is mild improvement in the defects noted on the stress images.   Subtraction (SDS): There is mild ischemia in the apical inferolateral wall anterolateral wall and apical cap.   Transient Ischemic Dilatation (Normal <1.22): 1.06   Lung/Heart Ratio (Normal <0.45): 0.32   Quantitative Gated Spect Images   QGS EDV: 98 ml   QGS ESV: 39 ml   Impression   Exercise Capacity: Fair exercise capacity.   BP Response: Normal blood pressure response.   Clinical Symptoms:  significant shortness of breath   ECG Impression: Significant EKG changes as outlined above.   Comparison with Prior Nuclear Study: No previous nuclear study performed   Overall Impression: The study is abnormal. The patient has shortness of breath with no chest pain. There were significant EKG changes with 1-25mm downsloping ST depression with stress. There is scar and ischemia in the apical aspect of the inferolateral wall and the apical anterolateral wall. This is also present at the apical cap.   LV Ejection Fraction: 60%. LV Wall Motion: There is hypokinesis in several apical segments.   Willa Rough, MD          Catheterization    Indication: Markedly abnormal stress test with AS  Procedure: After informed consent and clinical "time out" the right groin was prepped and draped in a sterile fashion. A 5Fr sheath was placed in the right femoral artery using seldinger technique and local lidocaine. Standard JL4, JR4 and angled pigtail catheters were used to engage the coronary arteries. Coronary arteries were visualized in orthogonal views using caudal and cranial angulation. RAO ventriculography was done using 28* cc of contrast. LAO aortography was done using 30cc of contrast   Medications:  Versed: 2 mg's Fentanyl: 25 ug's   Coronary Arteries:   Right dominant with no anomalies   LM: 70% Diffuse with caliber of LM much smaller than proximal LAD.   LAD: Calcified 60% distal discrete stenosis   D1: 80% mid vessel stenosis   Circumflex: calcified primarily OM only no significant disease   OM1: normal  RCA: 30% proximal Heavy calcification of proximal and mid vessel 70% mid  PDA: 30-40% mid   PLA: normal  Ventriculography: EF: 55 %, Apical hypokinesis  Hemodynamics:   Aortic Pressure: 175 83 mmHg LV Pressure: 201 14 15 MmHg   Right Heart Catheterization:   Indication: AS   Procedure: Standard right heart catheterization was done from the right femoral vein using a 7Fr sheath and  balloon flotation catheter.   Mean RA: 5 mmHg   RV: 31/4 mmHg   PA: 29/10 mmHg with a mean of 18 mmHg   PCWP: 10 mmHg   Fick Cardiac Output: 6.4 L/minute, 3.3 L/minute/M2  Aortic Valve mean gradient 19 mmHg Peak to Peak gradient 26 mmHg  Hakki AVA: .8 cm2   Impression:  Films reviewed with Dr Alice Reichert. LM is significant and diffuse. ECG on stress test had 2mm ST legment depression and catheter damping.   Echo shows mean gradient of 29 mmH and peak of 46 mmHg. Best Rx will be CABG with AVR. Called Dr Tyrone Sage who is in OR to see patient in recovery   She is stable for D/C but hopefully can arrange surgery for next week.   Charlton Haws   08/21/2011   12:36 PM           Pulmonary Function Tests  FVC                 2.04 L  (58% predicted) FEV1               1.38 L  (52% predicted) FEF25-75        0.82 L  (37% predicted) RV                   2.29 L  (100% predicted) DLCO              88% predicted  Interpretation c/w moderate-severe COPD with significant improvement following bronchodilator therapy      CT CHEST WITH CONTRAST   Technique:  Multidetector CT imaging of the chest was performed following the standard protocol during bolus administration of intravenous contrast.   Contrast: 80mL OMNIPAQUE IOHEXOL 300 MG/ML  SOLN   Comparison: CT scan of the chest dated 12/22/2009   Findings: There has been no significant change in the appearance of the chest since the prior CT scan dated 12/22/2009.  There is chronic consolidation and scarring in the medial aspect of the right upper lobe and in the medial aspect of the superior segment right lower lobe secondary to radiation therapy.   There is no hilar or mediastinal adenopathy.  There is stable scarring in the lung apices.   There is chronic pericardial thickening.  Heart size is normal. Extensive coronary artery calcification and aortic valvular calcification.  No acute osseous abnormality.  Visualized portion of the  upper abdomen is normal.  Tiny hiatal hernia.   IMPRESSION:   No significant change in the appearance of the chest since the prior study of 12/22/2009.  Stable scarring in the right lung secondary to radiation therapy.  Stable slight pericardial thickening.   No evidence of recurrent lung cancer.   Original Report Authenticated By: Gwynn Burly, M.D.      Impression:  I've directly reviewed the patient's recent echocardiogram and cardiac catheterization. The patient clearly has severe symptomatic aortic stenosis with preserved left ventricular function. The aortic valve appears tricuspid but it is clearly stenotic. The left ventricular outflow tract is relatively small, measuring only 2.0 cm in diameter. The etiology of aortic valve disease could be simple calcific aortic stenosis, although the patient may have history of rheumatic heart disease and she also underwent radiation therapy to the mediastinum for treatment of lung cancer. There is no mitral valve prolapse nor mitral regurgitation.  There is probably 60-70% stenosis of the left main coronary artery with significant three-vessel coronary artery disease. There is somewhat diffuse calcification and disease involving all of the coronary arteries proximally which is not surprising given the patient's long-standing history of diabetes mellitus.  There is right dominant coronary circulation. The left circumflex system is relatively small giving rise to one relatively small obtuse marginal branch that may not be large enough for grafting at the time of surgery. From a technical standpoint  the risks of surgery may be increased substantially do to the patient's history of previous radiation therapy.  Perioperative risks will also be somewhat increased do to the patient's numerous comorbid medical problems as noted previously. However, there is no question that the patient's only reasonable long-term treatment option is aortic valve  replacement with coronary artery bypass grafting.   Plan:  I've reviewed the indications, risks, and potential benefits of surgery at length with the patient and her daughter.  The patient was counseled at length regarding surgical alternatives with respect to valve replacement. In particular, discussion was held comparing in contrast and the risks of mechanical valve replacement and the need for lifelong anticoagulation versus use of a bioprosthetic tissue valve and the associated potential for late structural valve deterioration in failure.  This discussion was placed in the context of the patient's particular circumstances, and as a result the patient specifically requests that their valve be replaced using a bioprosthetic tissue valve.  The need for coronary artery bypass grafting was also explained in detail. The potential for increased risk related to the patient's previous history of lung cancer with radiation therapy to the chest and mediastinum were discussed.  They understand and accept all potential associated risks of surgery including but not limited to risk of death, stroke, myocardial infarction, congestive heart failure, respiratory failure, renal failure, pneumonia, bleeding requiring blood transfusion and or reexploration, arrhythmia, heart block or bradycardia requiring permanent pacemaker, pleural effusions or other delayed complications related to continued congestive heart failure, other late complications related to valve replacement, or late recurrence of symptomatic coronary artery disease.  We plan to proceed with elective aortic valve replacement and coronary artery bypass grafting on Wednesday this week. We reviewed the indications, risks, and potential benefits of surgery. All of her questions been addressed. Both she and her family understand that her procedure may be somewhat technically challenging and take longer than typical because of the fact that she has undergone  radiation therapy to the mediastinum in the past and because her aortic root is relatively small in size. She has been instructed not to take any of her regular medications on the morning of surgery other than Synthroid.       Salvatore Decent. Cornelius Moras, MD

## 2011-09-11 ENCOUNTER — Encounter (HOSPITAL_COMMUNITY)
Admission: RE | Disposition: A | Discharge status: Home / Self Care | Payer: Self-pay | Source: Ambulatory Visit | Attending: Thoracic Surgery (Cardiothoracic Vascular Surgery)

## 2011-09-11 ENCOUNTER — Inpatient Hospital Stay (HOSPITAL_COMMUNITY)
Admission: RE | Admit: 2011-09-11 | Discharge: 2011-09-18 | DRG: 220 | Disposition: A | Discharge status: Home / Self Care | Payer: Medicare Other | Source: Ambulatory Visit | Attending: Thoracic Surgery (Cardiothoracic Vascular Surgery) | Admitting: Thoracic Surgery (Cardiothoracic Vascular Surgery)

## 2011-09-11 ENCOUNTER — Encounter (HOSPITAL_COMMUNITY): Payer: Self-pay | Admitting: Thoracic Surgery (Cardiothoracic Vascular Surgery)

## 2011-09-11 ENCOUNTER — Inpatient Hospital Stay (HOSPITAL_COMMUNITY): Payer: Medicare Other

## 2011-09-11 ENCOUNTER — Encounter (HOSPITAL_COMMUNITY): Payer: Self-pay | Admitting: Vascular Surgery

## 2011-09-11 ENCOUNTER — Encounter (HOSPITAL_COMMUNITY): Payer: Self-pay | Admitting: *Deleted

## 2011-09-11 ENCOUNTER — Ambulatory Visit (HOSPITAL_COMMUNITY): Payer: Medicare Other | Admitting: Vascular Surgery

## 2011-09-11 DIAGNOSIS — Z7982 Long term (current) use of aspirin: Secondary | ICD-10-CM

## 2011-09-11 DIAGNOSIS — M810 Age-related osteoporosis without current pathological fracture: Secondary | ICD-10-CM | POA: Diagnosis present

## 2011-09-11 DIAGNOSIS — E109 Type 1 diabetes mellitus without complications: Secondary | ICD-10-CM | POA: Diagnosis present

## 2011-09-11 DIAGNOSIS — G609 Hereditary and idiopathic neuropathy, unspecified: Secondary | ICD-10-CM | POA: Diagnosis present

## 2011-09-11 DIAGNOSIS — I10 Essential (primary) hypertension: Secondary | ICD-10-CM | POA: Diagnosis present

## 2011-09-11 DIAGNOSIS — IMO0002 Reserved for concepts with insufficient information to code with codable children: Secondary | ICD-10-CM | POA: Diagnosis present

## 2011-09-11 DIAGNOSIS — I359 Nonrheumatic aortic valve disorder, unspecified: Secondary | ICD-10-CM

## 2011-09-11 DIAGNOSIS — Z951 Presence of aortocoronary bypass graft: Secondary | ICD-10-CM

## 2011-09-11 DIAGNOSIS — M1710 Unilateral primary osteoarthritis, unspecified knee: Secondary | ICD-10-CM | POA: Diagnosis present

## 2011-09-11 DIAGNOSIS — I2510 Atherosclerotic heart disease of native coronary artery without angina pectoris: Secondary | ICD-10-CM

## 2011-09-11 DIAGNOSIS — D62 Acute posthemorrhagic anemia: Secondary | ICD-10-CM | POA: Diagnosis not present

## 2011-09-11 DIAGNOSIS — E785 Hyperlipidemia, unspecified: Secondary | ICD-10-CM | POA: Diagnosis present

## 2011-09-11 DIAGNOSIS — K5900 Constipation, unspecified: Secondary | ICD-10-CM | POA: Diagnosis present

## 2011-09-11 DIAGNOSIS — Z794 Long term (current) use of insulin: Secondary | ICD-10-CM

## 2011-09-11 DIAGNOSIS — E876 Hypokalemia: Secondary | ICD-10-CM | POA: Diagnosis not present

## 2011-09-11 DIAGNOSIS — Z953 Presence of xenogenic heart valve: Secondary | ICD-10-CM

## 2011-09-11 DIAGNOSIS — Z87891 Personal history of nicotine dependence: Secondary | ICD-10-CM

## 2011-09-11 DIAGNOSIS — Z85118 Personal history of other malignant neoplasm of bronchus and lung: Secondary | ICD-10-CM

## 2011-09-11 HISTORY — PX: CORONARY ARTERY BYPASS GRAFT: SHX141

## 2011-09-11 HISTORY — DX: Presence of xenogenic heart valve: Z95.3

## 2011-09-11 HISTORY — DX: Unspecified osteoarthritis, unspecified site: M19.90

## 2011-09-11 HISTORY — DX: Presence of aortocoronary bypass graft: Z95.1

## 2011-09-11 HISTORY — PX: AORTIC VALVE REPLACEMENT: SHX41

## 2011-09-11 LAB — POCT I-STAT 3, ART BLOOD GAS (G3+)
Acid-base deficit: 5 mmol/L — ABNORMAL HIGH (ref 0.0–2.0)
Acid-base deficit: 4 mmol/L — ABNORMAL HIGH (ref 0.0–2.0)
Acid-base deficit: 2 mmol/L (ref 0.0–2.0)
Acid-base deficit: 5 mmol/L — ABNORMAL HIGH (ref 0.0–2.0)
Bicarbonate: 21.6 mEq/L (ref 20.0–24.0)
Bicarbonate: 19.6 mEq/L — ABNORMAL LOW (ref 20.0–24.0)
O2 Saturation: 100 %
O2 Saturation: 100 %
O2 Saturation: 100 %
O2 Saturation: 99 %
Patient temperature: 35.4
TCO2: 25 mmol/L (ref 0–100)
TCO2: 23 mmol/L (ref 0–100)
TCO2: 23 mmol/L (ref 0–100)
TCO2: 23 mmol/L (ref 0–100)
pCO2 arterial: 48.3 mmHg — ABNORMAL HIGH (ref 35.0–45.0)
pH, Arterial: 7.3 — ABNORMAL LOW (ref 7.350–7.450)
pH, Arterial: 7.301 — ABNORMAL LOW (ref 7.350–7.450)
pH, Arterial: 7.438 (ref 7.350–7.450)
pH, Arterial: 7.453 — ABNORMAL HIGH (ref 7.350–7.450)
pO2, Arterial: 134 mmHg — ABNORMAL HIGH (ref 80.0–100.0)

## 2011-09-11 LAB — APTT: aPTT: 45 seconds — ABNORMAL HIGH (ref 24–37)

## 2011-09-11 LAB — CBC
HCT: 26.1 % — ABNORMAL LOW (ref 36.0–46.0)
Hemoglobin: 9 g/dL — ABNORMAL LOW (ref 12.0–15.0)
MCV: 80.3 fL (ref 78.0–100.0)
Platelets: 127 10*3/uL — ABNORMAL LOW (ref 150–400)
RBC: 2.82 MIL/uL — ABNORMAL LOW (ref 3.87–5.11)
RBC: 3.25 MIL/uL — ABNORMAL LOW (ref 3.87–5.11)
RDW: 14.5 % (ref 11.5–15.5)
WBC: 9.3 10*3/uL (ref 4.0–10.5)
WBC: 9.6 10*3/uL (ref 4.0–10.5)

## 2011-09-11 LAB — POCT I-STAT 4, (NA,K, GLUC, HGB,HCT)
Glucose, Bld: 324 mg/dL — ABNORMAL HIGH (ref 70–99)
Glucose, Bld: 161 mg/dL — ABNORMAL HIGH (ref 70–99)
Glucose, Bld: 96 mg/dL (ref 70–99)
HCT: 34 % — ABNORMAL LOW (ref 36.0–46.0)
HCT: 20 % — ABNORMAL LOW (ref 36.0–46.0)
HCT: 24 % — ABNORMAL LOW (ref 36.0–46.0)
HCT: 19 % — ABNORMAL LOW (ref 36.0–46.0)
Hemoglobin: 6.8 g/dL — CL (ref 12.0–15.0)
Hemoglobin: 6.5 g/dL — CL (ref 12.0–15.0)
Hemoglobin: 8.8 g/dL — ABNORMAL LOW (ref 12.0–15.0)
Potassium: 5.2 mEq/L — ABNORMAL HIGH (ref 3.5–5.1)
Potassium: 4.4 mEq/L (ref 3.5–5.1)
Potassium: 4.5 mEq/L (ref 3.5–5.1)
Potassium: 5.6 mEq/L — ABNORMAL HIGH (ref 3.5–5.1)
Potassium: 4 mEq/L (ref 3.5–5.1)
Sodium: 135 mEq/L (ref 135–145)
Sodium: 138 mEq/L (ref 135–145)
Sodium: 143 mEq/L (ref 135–145)
Sodium: 145 mEq/L (ref 135–145)

## 2011-09-11 LAB — CREATININE, SERUM
Creatinine, Ser: 0.83 mg/dL (ref 0.50–1.10)
GFR calc Af Amer: 83 mL/min — ABNORMAL LOW (ref >90)
GFR calc non Af Amer: 71 mL/min — ABNORMAL LOW (ref >90)

## 2011-09-11 LAB — PROTIME-INR: Prothrombin Time: 23.8 seconds — ABNORMAL HIGH (ref 11.6–15.2)

## 2011-09-11 LAB — GLUCOSE, CAPILLARY
Glucose-Capillary: 178 mg/dL — ABNORMAL HIGH (ref 70–99)
Glucose-Capillary: 101 mg/dL — ABNORMAL HIGH (ref 70–99)
Glucose-Capillary: 111 mg/dL — ABNORMAL HIGH (ref 70–99)
Glucose-Capillary: 129 mg/dL — ABNORMAL HIGH (ref 70–99)

## 2011-09-11 LAB — POCT I-STAT GLUCOSE
Glucose, Bld: 298 mg/dL — ABNORMAL HIGH (ref 70–99)
Operator id: 156951

## 2011-09-11 LAB — POCT I-STAT, CHEM 8
Chloride: 110 mEq/L (ref 96–112)
Creatinine, Ser: 0.8 mg/dL (ref 0.50–1.10)
Glucose, Bld: 121 mg/dL — ABNORMAL HIGH (ref 70–99)
Potassium: 5.1 mEq/L (ref 3.5–5.1)

## 2011-09-11 LAB — HEMOGLOBIN AND HEMATOCRIT, BLOOD: Hemoglobin: 8.2 g/dL — ABNORMAL LOW (ref 12.0–15.0)

## 2011-09-11 SURGERY — REPLACEMENT, AORTIC VALVE, OPEN
Anesthesia: General | Site: Chest | Wound class: Clean

## 2011-09-11 MED ORDER — DOPAMINE-DEXTROSE 3.2-5 MG/ML-% IV SOLN
3.0000 ug/kg/min | INTRAVENOUS | Status: DC
  Administered 2011-09-11: 3 ug/kg/min via INTRAVENOUS

## 2011-09-11 MED ORDER — LACTATED RINGERS IV SOLN
INTRAVENOUS | Status: DC | PRN
  Administered 2011-09-11 (×3): via INTRAVENOUS

## 2011-09-11 MED ORDER — CHLORHEXIDINE GLUCONATE CLOTH 2 % EX PADS
6.0000 | MEDICATED_PAD | Freq: Every day | CUTANEOUS | Status: AC
  Administered 2011-09-12 – 2011-09-14 (×3): 6 via TOPICAL

## 2011-09-11 MED ORDER — SODIUM CHLORIDE 0.9 % IJ SOLN
3.0000 mL | INTRAMUSCULAR | Status: DC | PRN
  Administered 2011-09-11 (×3): 3 mL via INTRAVENOUS

## 2011-09-11 MED ORDER — ALBUMIN HUMAN 5 % IV SOLN
250.0000 mL | INTRAVENOUS | Status: AC | PRN
  Administered 2011-09-11 – 2011-09-12 (×2): 250 mL via INTRAVENOUS
  Filled 2011-09-11: qty 250

## 2011-09-11 MED ORDER — POTASSIUM CHLORIDE 10 MEQ/50ML IV SOLN
10.0000 meq | INTRAVENOUS | Status: AC
  Administered 2011-09-11 (×3): 10 meq via INTRAVENOUS

## 2011-09-11 MED ORDER — INSULIN REGULAR HUMAN 100 UNIT/ML IJ SOLN
INTRAMUSCULAR | Status: DC
  Administered 2011-09-12: 01:00:00 via INTRAVENOUS
  Administered 2011-09-12: 2.5 [IU]/h via INTRAVENOUS
  Filled 2011-09-11 (×3): qty 1

## 2011-09-11 MED ORDER — LACTATED RINGERS IV SOLN: INTRAVENOUS | Status: DC

## 2011-09-11 MED ORDER — ALBUMIN HUMAN 5 % IV SOLN
12.5000 g | Freq: Once | INTRAVENOUS | Status: AC
  Administered 2011-09-11: 12.5 g via INTRAVENOUS
  Filled 2011-09-11: qty 250

## 2011-09-11 MED ORDER — PANTOPRAZOLE SODIUM 40 MG PO TBEC
40.0000 mg | DELAYED_RELEASE_TABLET | Freq: Every day | ORAL | Status: DC
  Administered 2011-09-13 – 2011-09-17 (×3): 40 mg via ORAL
  Filled 2011-09-11 (×4): qty 1

## 2011-09-11 MED ORDER — NITROGLYCERIN IN D5W 200-5 MCG/ML-% IV SOLN: 0.0000 ug/min | INTRAVENOUS | Status: DC

## 2011-09-11 MED ORDER — ACETAMINOPHEN 160 MG/5ML PO SOLN: 650.0000 mg | ORAL | Status: AC

## 2011-09-11 MED ORDER — INSULIN REGULAR BOLUS VIA INFUSION
0.0000 [IU] | Freq: Three times a day (TID) | INTRAVENOUS | Status: DC
  Administered 2011-09-13: 2 [IU] via INTRAVENOUS
  Filled 2011-09-11: qty 10

## 2011-09-11 MED ORDER — ACETAMINOPHEN 500 MG PO TABS
1000.0000 mg | ORAL_TABLET | Freq: Four times a day (QID) | ORAL | Status: AC
  Administered 2011-09-12 – 2011-09-16 (×12): 1000 mg via ORAL
  Filled 2011-09-11 (×18): qty 2

## 2011-09-11 MED ORDER — ASPIRIN EC 325 MG PO TBEC
325.0000 mg | DELAYED_RELEASE_TABLET | Freq: Every day | ORAL | Status: DC
  Administered 2011-09-12 – 2011-09-18 (×7): 325 mg via ORAL
  Filled 2011-09-11 (×7): qty 1

## 2011-09-11 MED ORDER — METOPROLOL TARTRATE 12.5 MG HALF TABLET
12.5000 mg | ORAL_TABLET | Freq: Two times a day (BID) | ORAL | Status: DC
  Administered 2011-09-12 (×2): 12.5 mg via ORAL
  Filled 2011-09-11 (×5): qty 1

## 2011-09-11 MED ORDER — OXYCODONE HCL 5 MG PO TABS
5.0000 mg | ORAL_TABLET | ORAL | Status: DC | PRN
  Administered 2011-09-12 – 2011-09-14 (×4): 5 mg via ORAL
  Filled 2011-09-11 (×4): qty 1

## 2011-09-11 MED ORDER — ONDANSETRON HCL 4 MG/2ML IJ SOLN
4.0000 mg | Freq: Four times a day (QID) | INTRAMUSCULAR | Status: DC | PRN
  Administered 2011-09-12 – 2011-09-16 (×3): 4 mg via INTRAVENOUS
  Filled 2011-09-11 (×3): qty 2

## 2011-09-11 MED ORDER — BISACODYL 5 MG PO TBEC
10.0000 mg | DELAYED_RELEASE_TABLET | Freq: Every day | ORAL | Status: DC
  Administered 2011-09-12 – 2011-09-16 (×5): 10 mg via ORAL
  Filled 2011-09-11: qty 2
  Filled 2011-09-11: qty 1
  Filled 2011-09-11 (×3): qty 2

## 2011-09-11 MED ORDER — ACETAMINOPHEN 160 MG/5ML PO SOLN
975.0000 mg | Freq: Four times a day (QID) | ORAL | Status: DC
  Administered 2011-09-11: 975 mg
  Filled 2011-09-11 (×2): qty 40.6

## 2011-09-11 MED ORDER — PROTAMINE SULFATE 10 MG/ML IV SOLN
INTRAVENOUS | Status: DC | PRN
  Administered 2011-09-11: 300 mg via INTRAVENOUS

## 2011-09-11 MED ORDER — DEXMEDETOMIDINE HCL IN NACL 200 MCG/50ML IV SOLN
0.1000 ug/kg/h | INTRAVENOUS | Status: DC
  Administered 2011-09-11: 0.5 ug/kg/h via INTRAVENOUS
  Administered 2011-09-11: 0.1 ug/kg/h via INTRAVENOUS
  Filled 2011-09-11 (×3): qty 50

## 2011-09-11 MED ORDER — MIDAZOLAM HCL 5 MG/5ML IJ SOLN
INTRAMUSCULAR | Status: DC | PRN
  Administered 2011-09-11: 3 mg via INTRAVENOUS
  Administered 2011-09-11 (×2): 2 mg via INTRAVENOUS
  Administered 2011-09-11: 3 mg via INTRAVENOUS

## 2011-09-11 MED ORDER — SODIUM CHLORIDE 0.9 % IV SOLN: INTRAVENOUS | Status: DC

## 2011-09-11 MED ORDER — BISACODYL 10 MG RE SUPP
10.0000 mg | Freq: Every day | RECTAL | Status: DC
  Filled 2011-09-11: qty 1

## 2011-09-11 MED ORDER — SODIUM CHLORIDE 0.9 % IV SOLN
INTRAVENOUS | Status: DC | PRN
  Administered 2011-09-11 (×2): via INTRAVENOUS

## 2011-09-11 MED ORDER — DEXTROSE 50 % IV SOLN
INTRAVENOUS | Status: AC
  Administered 2011-09-11: 20 mL
  Filled 2011-09-11: qty 50

## 2011-09-11 MED ORDER — ACETAMINOPHEN 650 MG RE SUPP
650.0000 mg | RECTAL | Status: AC
  Administered 2011-09-11: 650 mg via RECTAL

## 2011-09-11 MED ORDER — MIDAZOLAM HCL 2 MG/2ML IJ SOLN: 2.0000 mg | INTRAMUSCULAR | Status: DC | PRN

## 2011-09-11 MED ORDER — PROPOFOL 10 MG/ML IV EMUL
INTRAVENOUS | Status: DC | PRN
  Administered 2011-09-11: 30 mg via INTRAVENOUS

## 2011-09-11 MED ORDER — METOPROLOL TARTRATE 25 MG/10 ML ORAL SUSPENSION
12.5000 mg | Freq: Two times a day (BID) | ORAL | Status: DC
  Filled 2011-09-11 (×3): qty 5

## 2011-09-11 MED ORDER — 0.9 % SODIUM CHLORIDE (POUR BTL) OPTIME
TOPICAL | Status: DC | PRN
  Administered 2011-09-11: 6000 mL

## 2011-09-11 MED ORDER — FENTANYL CITRATE 0.05 MG/ML IJ SOLN
INTRAMUSCULAR | Status: DC | PRN
  Administered 2011-09-11: 25 ug via INTRAVENOUS
  Administered 2011-09-11: 275 ug via INTRAVENOUS
  Administered 2011-09-11: 150 ug via INTRAVENOUS
  Administered 2011-09-11: 250 ug via INTRAVENOUS
  Administered 2011-09-11: 50 ug via INTRAVENOUS
  Administered 2011-09-11: 1250 ug via INTRAVENOUS

## 2011-09-11 MED ORDER — FAMOTIDINE IN NACL 20-0.9 MG/50ML-% IV SOLN
20.0000 mg | Freq: Two times a day (BID) | INTRAVENOUS | Status: DC
  Administered 2011-09-11: 20 mg via INTRAVENOUS

## 2011-09-11 MED ORDER — SODIUM CHLORIDE 0.9 % IJ SOLN
10.0000 mL | INTRAMUSCULAR | Status: DC | PRN
  Administered 2011-09-11: 10 mL via INTRAVENOUS

## 2011-09-11 MED ORDER — SODIUM CHLORIDE 0.9 % IV SOLN: 250.0000 mL | INTRAVENOUS | Status: DC

## 2011-09-11 MED ORDER — ASPIRIN 81 MG PO CHEW: 324.0000 mg | CHEWABLE_TABLET | Freq: Every day | ORAL | Status: DC

## 2011-09-11 MED ORDER — MAGNESIUM SULFATE 40 MG/ML IJ SOLN
4.0000 g | Freq: Once | INTRAMUSCULAR | Status: AC
  Administered 2011-09-11: 4 g via INTRAVENOUS
  Filled 2011-09-11: qty 100

## 2011-09-11 MED ORDER — ROCURONIUM BROMIDE 100 MG/10ML IV SOLN
INTRAVENOUS | Status: DC | PRN
  Administered 2011-09-11 (×2): 50 mg via INTRAVENOUS

## 2011-09-11 MED ORDER — METOPROLOL TARTRATE 1 MG/ML IV SOLN: 2.5000 mg | INTRAVENOUS | Status: DC | PRN

## 2011-09-11 MED ORDER — MORPHINE SULFATE 2 MG/ML IJ SOLN
2.0000 mg | INTRAMUSCULAR | Status: DC | PRN
  Administered 2011-09-11 – 2011-09-12 (×4): 2 mg via INTRAVENOUS
  Filled 2011-09-11 (×4): qty 1

## 2011-09-11 MED ORDER — PHENYLEPHRINE HCL 10 MG/ML IJ SOLN
0.0000 ug/min | INTRAVENOUS | Status: DC
  Filled 2011-09-11: qty 2

## 2011-09-11 MED ORDER — DOCUSATE SODIUM 100 MG PO CAPS
200.0000 mg | ORAL_CAPSULE | Freq: Every day | ORAL | Status: DC
  Administered 2011-09-12 – 2011-09-16 (×5): 200 mg via ORAL
  Filled 2011-09-11 (×7): qty 2

## 2011-09-11 MED ORDER — SODIUM CHLORIDE 0.45 % IV SOLN
INTRAVENOUS | Status: DC
  Administered 2011-09-12: 10:00:00 via INTRAVENOUS

## 2011-09-11 MED ORDER — HEPARIN SODIUM (PORCINE) 1000 UNIT/ML IJ SOLN
INTRAMUSCULAR | Status: DC | PRN
  Administered 2011-09-11: 28000 [IU] via INTRAVENOUS
  Administered 2011-09-11: 2000 [IU] via INTRAVENOUS

## 2011-09-11 MED ORDER — HEMOSTATIC AGENTS (NO CHARGE) OPTIME
TOPICAL | Status: DC | PRN
  Administered 2011-09-11: 1 via TOPICAL

## 2011-09-11 MED ORDER — PHENYLEPHRINE HCL 10 MG/ML IJ SOLN
10.0000 mg | INTRAVENOUS | Status: DC | PRN
  Administered 2011-09-11: 20 ug/min via INTRAVENOUS
  Administered 2011-09-11: 10 ug/min via INTRAVENOUS

## 2011-09-11 MED ORDER — VANCOMYCIN HCL IN DEXTROSE 1-5 GM/200ML-% IV SOLN
1000.0000 mg | Freq: Once | INTRAVENOUS | Status: AC
  Administered 2011-09-11: 1000 mg via INTRAVENOUS
  Filled 2011-09-11 (×2): qty 200

## 2011-09-11 MED ORDER — CALCIUM CHLORIDE 10 % IV SOLN
1.0000 g | Freq: Once | INTRAVENOUS | Status: AC | PRN
  Filled 2011-09-11: qty 10

## 2011-09-11 MED ORDER — LEVOTHYROXINE SODIUM 112 MCG PO TABS
112.0000 ug | ORAL_TABLET | Freq: Every day | ORAL | Status: DC
  Administered 2011-09-12 – 2011-09-18 (×7): 112 ug via ORAL
  Filled 2011-09-11 (×9): qty 1

## 2011-09-11 MED ORDER — SODIUM CHLORIDE 0.9 % IJ SOLN
3.0000 mL | Freq: Two times a day (BID) | INTRAMUSCULAR | Status: DC
  Administered 2011-09-12 (×2): 3 mL via INTRAVENOUS

## 2011-09-11 MED ORDER — CEFUROXIME SODIUM 1.5 G IJ SOLR
1.5000 g | Freq: Two times a day (BID) | INTRAMUSCULAR | Status: DC
  Administered 2011-09-11 – 2011-09-12 (×3): 1.5 g via INTRAVENOUS
  Filled 2011-09-11 (×4): qty 1.5

## 2011-09-11 MED ORDER — SODIUM CHLORIDE 0.9 % IR SOLN
Status: DC | PRN
  Administered 2011-09-11: 3000 mL

## 2011-09-11 MED ORDER — MORPHINE SULFATE 2 MG/ML IJ SOLN: 1.0000 mg | INTRAMUSCULAR | Status: AC | PRN

## 2011-09-11 MED ORDER — ALBUMIN HUMAN 5 % IV SOLN
INTRAVENOUS | Status: DC | PRN
  Administered 2011-09-11 (×3): via INTRAVENOUS

## 2011-09-11 MED ORDER — VECURONIUM BROMIDE 10 MG IV SOLR
INTRAVENOUS | Status: DC | PRN
  Administered 2011-09-11: 1 mg via INTRAVENOUS
  Administered 2011-09-11: 2 mg via INTRAVENOUS
  Administered 2011-09-11: 4 mg via INTRAVENOUS
  Administered 2011-09-11: 3 mg via INTRAVENOUS

## 2011-09-11 MED ORDER — MUPIROCIN 2 % EX OINT
1.0000 "application " | TOPICAL_OINTMENT | Freq: Two times a day (BID) | CUTANEOUS | Status: AC
  Administered 2011-09-11 – 2011-09-16 (×10): 1 via NASAL
  Filled 2011-09-11: qty 22

## 2011-09-11 SURGICAL SUPPLY — 101 items
ADAPTER CARDIO PERF ANTE/RETRO (ADAPTER) ×2 IMPLANT
ADPR PRFSN 84XANTGRD RTRGD (ADAPTER) ×1
APL SKNCLS STERI-STRIP NONHPOA (GAUZE/BANDAGES/DRESSINGS) ×1
APPLIER CLIP 9.375 SM OPEN (CLIP) ×4
APR CLP SM 9.3 20 MLT OPN (CLIP) ×2
ATTRACTOMAT 16X20 MAGNETIC DRP (DRAPES) ×2 IMPLANT
BAG DECANTER FOR FLEXI CONT (MISCELLANEOUS) ×2 IMPLANT
BANDAGE ELASTIC 4 VELCRO ST LF (GAUZE/BANDAGES/DRESSINGS) ×2 IMPLANT
BANDAGE ELASTIC 6 VELCRO ST LF (GAUZE/BANDAGES/DRESSINGS) ×2 IMPLANT
BANDAGE GAUZE ELAST BULKY 4 IN (GAUZE/BANDAGES/DRESSINGS) ×2 IMPLANT
BENZOIN TINCTURE PRP APPL 2/3 (GAUZE/BANDAGES/DRESSINGS) ×2 IMPLANT
BLADE SURG 11 STRL SS (BLADE) ×2 IMPLANT
CANISTER SUCTION 2500CC (MISCELLANEOUS) ×2 IMPLANT
CANN PRFSN .5XCNCT 15X34-48 (MISCELLANEOUS)
CANNULA GUNDRY RCSP 15FR (MISCELLANEOUS) ×2 IMPLANT
CANNULA PRFSN .5XCNCT 15X34-48 (MISCELLANEOUS) IMPLANT
CANNULA VEN 2 STAGE (MISCELLANEOUS)
CATH HEART VENT LEFT (CATHETERS) ×1 IMPLANT
CATH ROBINSON RED A/P 18FR (CATHETERS) ×6 IMPLANT
CATH THORACIC 36FR (CATHETERS) ×2 IMPLANT
CATH THORACIC 36FR RT ANG (CATHETERS) ×2 IMPLANT
CLIP APPLIE 9.375 SM OPEN (CLIP) IMPLANT
CLIP FOGARTY SPRING 6M (CLIP) ×1 IMPLANT
CLIP TI MEDIUM 24 (CLIP) IMPLANT
CLIP TI WIDE RED SMALL 24 (CLIP) IMPLANT
CLOTH BEACON ORANGE TIMEOUT ST (SAFETY) ×2 IMPLANT
COVER SURGICAL LIGHT HANDLE (MISCELLANEOUS) ×4 IMPLANT
CRADLE DONUT ADULT HEAD (MISCELLANEOUS) ×2 IMPLANT
DRAIN CHANNEL 32F RND 10.7 FF (WOUND CARE) ×2 IMPLANT
DRAPE SLUSH/WARMER DISC (DRAPES) ×2 IMPLANT
DRSG COVADERM 4X14 (GAUZE/BANDAGES/DRESSINGS) ×2 IMPLANT
ELECT REM PT RETURN 9FT ADLT (ELECTROSURGICAL) ×2
ELECTRODE REM PT RTRN 9FT ADLT (ELECTROSURGICAL) ×1 IMPLANT
GLOVE BIO SURGEON STRL SZ7 (GLOVE) ×2 IMPLANT
GLOVE BIO SURGEON STRL SZ8 (GLOVE) ×1 IMPLANT
GLOVE BIOGEL PI IND STRL 6 (GLOVE) IMPLANT
GLOVE BIOGEL PI IND STRL 7.0 (GLOVE) IMPLANT
GLOVE BIOGEL PI INDICATOR 6 (GLOVE) ×2
GLOVE BIOGEL PI INDICATOR 7.0 (GLOVE) ×4
GLOVE ORTHO TXT STRL SZ7.5 (GLOVE) ×6 IMPLANT
GOWN STRL NON-REIN LRG LVL3 (GOWN DISPOSABLE) ×10 IMPLANT
HEMOSTAT POWDER SURGIFOAM 1G (HEMOSTASIS) ×5 IMPLANT
INSERT FOGARTY XLG (MISCELLANEOUS) IMPLANT
KIT BASIN OR (CUSTOM PROCEDURE TRAY) ×2 IMPLANT
KIT ROOM TURNOVER OR (KITS) ×2 IMPLANT
KIT SUCTION CATH 14FR (SUCTIONS) ×2 IMPLANT
LINE VENT (MISCELLANEOUS) ×1 IMPLANT
MARKER GRAFT CORONARY BYPASS (MISCELLANEOUS) ×6 IMPLANT
NEEDLE AORTIC AIR ASPIRATING (NEEDLE) IMPLANT
NS IRRIG 1000ML POUR BTL (IV SOLUTION) ×11 IMPLANT
PACK OPEN HEART (CUSTOM PROCEDURE TRAY) ×2 IMPLANT
PAD ARMBOARD 7.5X6 YLW CONV (MISCELLANEOUS) ×4 IMPLANT
PENCIL BUTTON HOLSTER BLD 10FT (ELECTRODE) ×2 IMPLANT
PUNCH AORTIC ROTATE 4.0MM (MISCELLANEOUS) IMPLANT
PUNCH AORTIC ROTATE 4.5MM 8IN (MISCELLANEOUS) IMPLANT
PUNCH AORTIC ROTATE 5MM 8IN (MISCELLANEOUS) IMPLANT
SET CARDIOPLEGIA MPS 5001102 (MISCELLANEOUS) ×1 IMPLANT
SET IRRIG TUBING LAPAROSCOPIC (IRRIGATION / IRRIGATOR) ×2 IMPLANT
SOLUTION ANTI FOG 6CC (MISCELLANEOUS) ×1 IMPLANT
SPONGE GAUZE 4X4 12PLY (GAUZE/BANDAGES/DRESSINGS) ×4 IMPLANT
STRIP CLOSURE SKIN 1/2X4 (GAUZE/BANDAGES/DRESSINGS) ×2 IMPLANT
SUT BONE WAX W31G (SUTURE) ×2 IMPLANT
SUT ETHIBON 2 0 V 52N 30 (SUTURE) ×2 IMPLANT
SUT ETHIBON EXCEL 2-0 V-5 (SUTURE) IMPLANT
SUT ETHIBOND 2 0 SH (SUTURE) ×2 IMPLANT
SUT ETHIBOND 2 0 V4 (SUTURE) IMPLANT
SUT ETHIBOND 2 0V4 GREEN (SUTURE) IMPLANT
SUT ETHIBOND NAB MH 2-0 36IN (SUTURE) ×1 IMPLANT
SUT ETHIBOND V-5 VALVE (SUTURE) IMPLANT
SUT ETHIBOND X763 2 0 SH 1 (SUTURE) ×6 IMPLANT
SUT ETHILON 3 0 FSL (SUTURE) ×3 IMPLANT
SUT MNCRL AB 3-0 PS2 18 (SUTURE) ×4 IMPLANT
SUT MNCRL AB 4-0 PS2 18 (SUTURE) ×1 IMPLANT
SUT PDS AB 1 CTX 36 (SUTURE) ×4 IMPLANT
SUT PROLENE 3 0 SH DA (SUTURE) ×9 IMPLANT
SUT PROLENE 4 0 RB 1 (SUTURE) ×4
SUT PROLENE 4 0 SH DA (SUTURE) ×2 IMPLANT
SUT PROLENE 4-0 RB1 .5 CRCL 36 (SUTURE) IMPLANT
SUT PROLENE 6 0 C 1 30 (SUTURE) ×5 IMPLANT
SUT PROLENE 7 0 DA (SUTURE) IMPLANT
SUT PROLENE 7.0 RB 3 (SUTURE) ×6 IMPLANT
SUT PROLENE 8 0 BV175 6 (SUTURE) ×3 IMPLANT
SUT PROLENE BLUE 7 0 (SUTURE) ×2 IMPLANT
SUT PROLENE POLY MONO (SUTURE) ×3 IMPLANT
SUT SILK  1 MH (SUTURE) ×2
SUT SILK 1 MH (SUTURE) ×1 IMPLANT
SUT STEEL 6MS V (SUTURE) IMPLANT
SUT VIC AB 2-0 CT1 27 (SUTURE) ×4
SUT VIC AB 2-0 CT1 TAPERPNT 27 (SUTURE) IMPLANT
SUT VIC AB 2-0 CTX 27 (SUTURE) IMPLANT
SUTURE E-PAK OPEN HEART (SUTURE) ×2 IMPLANT
SYSTEM SAHARA CHEST DRAIN ATS (WOUND CARE) ×2 IMPLANT
TAPE CLOTH SOFT 2X10 (GAUZE/BANDAGES/DRESSINGS) ×1 IMPLANT
TOWEL OR 17X24 6PK STRL BLUE (TOWEL DISPOSABLE) ×2 IMPLANT
TOWEL OR 17X26 10 PK STRL BLUE (TOWEL DISPOSABLE) ×4 IMPLANT
TRAY FOLEY IC TEMP SENS 14FR (CATHETERS) ×2 IMPLANT
TUBING INSUFFLATION 10FT LAP (TUBING) ×2 IMPLANT
UNDERPAD 30X30 INCONTINENT (UNDERPADS AND DIAPERS) ×2 IMPLANT
VALVE MAGNA EASE 21MM (Prosthesis & Implant Heart) ×1 IMPLANT
VENT LEFT HEART 12002 (CATHETERS) ×2
WATER STERILE IRR 1000ML POUR (IV SOLUTION) ×4 IMPLANT

## 2011-09-11 NOTE — Progress Notes (Signed)
Dr. Cornelius Moras notified of CI, labs, vital signs, chest tube and urine output, orders received

## 2011-09-11 NOTE — Brief Op Note (Signed)
09/11/2011  12:56 PM  PATIENT:  Jaclyn [name]  66 y.o. 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

## 2011-09-11 NOTE — Progress Notes (Signed)
CHAPLAIN NOTE:  650 am. Responded to page from [place] ([phone]) that pt requested chaplain before she goes to surgery. I met pt and daughter in room. I listened to pt's story and offered spiritual counseling, comfort and presence. At pt request I read scripture and prayed with them. 720 am.

## 2011-09-11 NOTE — OR Nursing (Signed)
Rectal temperature probe placed without difficulty.

## 2011-09-11 NOTE — Progress Notes (Signed)
Patient ID: Jaclyn [name], female   DOB: 26-Apr-1945, 66 y.o.   MRN: [id number]                   [address]            [name] [address]          [phone]     Day of Surgery Procedure(s) (LRB): AORTIC VALVE REPLACEMENT (AVR) (N/A) CORONARY ARTERY BYPASS GRAFTING (CABG) (N/A)  Total Length of Stay:  LOS: 0 days  BP 107/47  Pulse 67  Temp 96.6 F (35.9 C) (Oral)  Resp 22  Wt 182 lb (82.555 kg)  SpO2 100%     . sodium chloride    . sodium chloride 10 mL/hr at 09/11/11 1700  . sodium chloride    . dexmedetomidine 0.5 mcg/kg/hr (09/11/11 1718)  . insulin (NOVOLIN-R) infusion 0.8 Units (09/11/11 1700)  . lactated ringers 20 mL/hr at 09/11/11 1700  . nitroGLYCERIN Stopped (09/11/11 1705)  . phenylephrine (NEO-SYNEPHRINE) Adult infusion 25 mcg/min (09/11/11 1705)     Lab Results  Component Value Date   WBC 9.3 09/11/2011   HGB 7.9* 09/11/2011   HCT 23.3* 09/11/2011   PLT 127* 09/11/2011   GLUCOSE 58* 09/11/2011   ALT 10 09/09/2011   AST 17 09/09/2011   NA 145 09/11/2011   K 3.9 09/11/2011   CL 106 09/09/2011   CREATININE 1.03 09/09/2011   BUN 13 09/09/2011   CO2 20 09/09/2011   INR 2.09* 09/11/2011   HGBA1C 6.8* 09/09/2011   Not bleeding, getting blood for  acute expected blood loss anemia.  Still asleep  Delight Ovens MD  Beeper [phone] Office [phone] 09/11/2011 5:46 PM

## 2011-09-11 NOTE — Anesthesia Postprocedure Evaluation (Signed)
Anesthesia Post Note  Patient: Jaclyn [name]  Procedure(s) Performed: Procedure(s) (LRB): AORTIC VALVE REPLACEMENT (AVR) (N/A) CORONARY ARTERY BYPASS GRAFTING (CABG) (N/A)  Anesthesia type: general  Patient location: PACU  Post pain: Pain level controlled  Post assessment: Patient's Cardiovascular Status Stable  Last Vitals:  Filed Vitals:   09/11/11 1515  BP: 107/47  Pulse: 80  Temp:   Resp: 12    Post vital signs: Reviewed and stable  Level of consciousness: sedated  Complications: No apparent anesthesia complications

## 2011-09-11 NOTE — Transfer of Care (Signed)
Immediate Anesthesia Transfer of Care Note  Patient: Jaclyn [name]  Procedure(s) Performed: Procedure(s) (LRB): AORTIC VALVE REPLACEMENT (AVR) (N/A) CORONARY ARTERY BYPASS GRAFTING (CABG) (N/A)  Patient Location: ICU  Anesthesia Type: General  Level of Consciousness: unresponsive  Airway & Oxygen Therapy: Patient remains intubated per anesthesia plan  Post-op Assessment: Post -op Vital signs reviewed and stable  Post vital signs: Reviewed and stable  Complications: No apparent anesthesia complications

## 2011-09-11 NOTE — Progress Notes (Signed)
Pt. Stated at 0600 she removed her insulin pump. CBG 178. Pt. States if she is not eating her blood sugar will drop on the basal rate."my basal rate is not set right". Notified Dr. Noreene Larsson of the above. Stated that was okay and once iv's are in the glucomander will be started.

## 2011-09-11 NOTE — Anesthesia Preprocedure Evaluation (Addendum)
Anesthesia Evaluation  Patient identified by MRN, date of birth, ID band Patient awake    Reviewed: Allergy & Precautions, H&P , NPO status , Patient's Chart, lab work & pertinent test results, reviewed documented beta blocker date and time   History of Anesthesia Complications Negative for: history of anesthetic complications  Airway Mallampati: II TM Distance: >3 FB Neck ROM: Full    Dental  (+) Dental Advisory Given   Pulmonary COPD         Cardiovascular hypertension, + CAD  LM-70 LAD 60, EF 55%, AS   Neuro/Psych Seizures -,  No seizures for years- no RX    GI/Hepatic GERD-  ,  Endo/Other  Well Controlled, Type 2, Insulin DependentHypothyroidism   Renal/GU      Musculoskeletal   Abdominal   Peds  Hematology   Anesthesia Other Findings   Reproductive/Obstetrics                         Anesthesia Physical Anesthesia Plan  ASA: III  Anesthesia Plan: General   Post-op Pain Management:    Induction: Intravenous  Airway Management Planned: Oral ETT  Additional Equipment: Arterial line, PA Cath, Ultrasound Guidance Line Placement and 3D TEE  Intra-op Plan:   Post-operative Plan: Post-operative intubation/ventilation  Informed Consent: I have reviewed the patients History and Physical, chart, labs and discussed the procedure including the risks, benefits and alternatives for the proposed anesthesia with the patient or authorized representative who has indicated his/her understanding and acceptance.   Dental advisory given  Plan Discussed with: CRNA and Surgeon  Anesthesia Plan Comments:        Anesthesia Quick Evaluation

## 2011-09-11 NOTE — Progress Notes (Signed)
  Echocardiogram Echocardiogram Transesophageal (OR) has been performed.  Jorje Guild 09/11/2011, 9:33 AM

## 2011-09-11 NOTE — Op Note (Signed)
CARDIOTHORACIC SURGERY OPERATIVE NOTE  Date of Procedure:  09/11/2011  Preoperative Diagnosis:   Severe Aortic Stenosis  Severe Left Main Disease and 3-vessel Coronary Artery Disease  Postoperative Diagnosis: Same  Procedure:    Aortic Valve Replacement  Edwards Magna Ease Pericardial Tissue Valve (size 21mm, model # 3300TFX, serial # [id number])   Coronary Artery Bypass Grafting x 3  Free Left Internal Mammary Artery to Distal Left Anterior Descending Coronary Artery  Saphenous Vein Graft to Right Posterolateral Branch Coronary Artery  Saphenous Vein Graft to First Diagonal Branch of Left Anterior Descending Coronary Artery  Endoscopic Vein Harvest from Right Thigh and Lower Leg  Surgeon: Salvatore Decent. 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  The patient has multiple medical problems including history of hypertension, hyperlipidemia, long-standing type 1 diabetes mellitus, previous history of tobacco abuse and lung cancer. In the past the patient was told that she had a heart murmur and this was attributed to mitral valve prolapse. The patient also is not certain whether or not she may have had rheumatic fever during her youth.  Her brother had rheumatic fever. The patient recently was referred for cardiac evaluation do to the development of frequent palpitations or dizzy spells, atypical chest pain, and exertional shortness of breath. The  patient states that she has had mild exertional shortness of breath dating back to her history of the heavy tobacco abuse and lung cancer. She was diagnosed with small cell carcinoma of the lung approximately 12-14 years ago.  This was treated with chemo and radiation therapy. She quit smoking at that time.  Since then she has remained stable until over the last year or so the patient has developed somewhat more noticeable exertional shortness of breath and progressive fatigue. The patient has also developed frequent tachypalpitations and some atypical episodes of chest pressure that do not seem to be related to physical activity. She has developed significant fatigue and notes that she gets tired much more easily than she used to. She has had several dizzy spells without syncope. She was evaluated by Dr. Eden Emms and underwent 2-D echocardiogram confirming the presence of moderate to severe aortic stenosis with normal left ventricular systolic function.  Stress myoview was performed demonstrating stress-induced EKG changes and ischemia on nuclear imaging.  Elective left and right heart catheterization confirmed the presence of severe aortic stenosis and also documented the presence of severe left main disease and three-vessel coronary artery disease. The patient was referred for elective surgical consultation.  The patient has been seen in consultation and counseled at length regarding the indications, risks and potential benefits of surgery.  All questions have been answered, and the patient provides full informed consent for the operation as described.      DETAILS OF THE OPERATIVE PROCEDURE  The patient is brought to the operating room on the above mentioned date and central monitoring was established by the anesthesia team including placement of Swan-Ganz catheter and radial arterial line. The patient is placed in  the supine position on the operating table.  Intravenous antibiotics are administered.  General endotracheal anesthesia is induced uneventfully. A Foley catheter is placed.  Baseline transesophageal echocardiogram was performed.  Findings were notable for normal left ventricular systolic function with moderate left ventricular hypertrophy. There was severe aortic stenosis. The aortic valve was tricuspid and severely calcified. There was moderate aortic insufficiency. There is trivial mitral regurgitation. No other significant abnormalities were noted.  The patient's chest, abdomen, both groins, and both lower extremities are prepared and draped in a sterile manner. A time out procedure is performed.  A median sternotomy incision was performed and the left internal mammary artery is dissected from the chest wall and prepared for bypass grafting. The left internal mammary artery is notably good quality conduit. Simultaneously, saphenous vein is obtained from the patient's right thigh and leg using endoscopic vein harvest technique. The saphenous vein is notably good quality conduit. After removal of the saphenous vein, the small surgical incisions in the lower extremity are closed with absorbable suture. Following systemic heparinization, the left internal mammary artery was transected distally noted to have excellent flow.  Upon placement of the retractor into the mediastinum there immediately became significant venous bleeding where the anterior surface of the innominate vein was torn as it had been adherent to the undersurface of the manubrium. In order to satisfactorily repair the defect in the innominate vein the tissues between the vein and the manubrium on the left side were mobilized off of the back wall of the manubrium in several pledgeted mattress sutures were utilized to repair the vein. This necessitated ligation of the origin of the left internal mammary artery. Left internal mammary artery was subsequently transected to be utilized as a free graft.  The pericardium is opened. The  ascending aorta is soft and chronically diseased in appearance with diffuse fibrosis, all consistent with severe radiation change. There were no areas of calcification or atherosclerotic plaque. The ascending aorta and the right atrium are cannulated for cardioplegia bypass.  Adequate heparinization is verified.   A retrograde cardioplegia cannula is placed through the right atrium into the coronary sinus.  The entire pre-bypass portion of the operation was notable for stable hemodynamics.  Cardiopulmonary bypass was begun and a left ventricular vent placed through the right superior pulmonary vein.  The surface of the heart inspected. Distal target vessels are selected for coronary artery bypass grafting. A cardioplegia cannula is placed in the ascending aorta.  A temperature probe was placed in the interventricular septum.  The patient is cooled to 32C systemic temperature.  The aortic cross clamp is applied and cold blood cardioplegia is delivered initially in an antegrade fashion through the aortic root.   Supplemental cardioplegia is given retrograde through the coronary sinus catheter.  Iced saline slush is applied for topical hypothermia.  The initial cardioplegic arrest is rapid with early diastolic arrest.  Repeat doses of cardioplegia are administered intermittently throughout the entire cross clamp portion of the operation through the aortic root,  through the coronary sinus catheter, and through subsequently placed vein grafts in order to maintain completely flat electrocardiogram and septal myocardial temperature below 15C.  Myocardial protection was felt to be excellent.  The following distal coronary artery bypass grafts were performed:   The right posterolateral branch of the right coronary artery was grafted using a reversed saphenous vein graft in an end-to-side fashion.  At the site of distal anastomosis the target vessel was good quality and measured approximately 1.5 mm  in diameter.   This was the dominant terminal branch of the right coronary system.  The first diagonal branch of the left anterior descending coronary artery was grafted using a reversed saphenous vein graft in an end-to-side fashion.  At the site of distal anastomosis the target vessel was good quality and measured approximately 1.5 mm in diameter.  The distal left anterior coronary artery was grafted with the left internal mammary artery as a free graft in an end-to-side fashion.  At the site of distal anastomosis the target vessel was good quality and measured approximately 1.5 mm in diameter.   An oblique transverse aortotomy incision was performed.  The aortic valve was inspected and notable for severe calcific aortic stenosis.  The aortic valve leaflets were excised sharply and the aortic annulus decalcified.  Decalcification was notably straightforward.  The aortic annulus was sized to accept a 21 mm prosthesis.  The aortic root and left ventricle were irrigated with copious cold saline solution.  Aortic valve replacement was performed using interrupted horizontal mattress 2-0 Ethibond pledgeted sutures with pledgets in the subannular position.  An [facility] Ease pericardial tissue valve (size 21 mm, model # 3300TFX, serial # [id number]) was implanted uneventfully. The valve seated appropriately with adequate space beneath the left main and right coronary artery.  The aortotomy was closed using a 2-layer closure of running 4-0 Prolene suture with Teflon felt strips to buttress the suture line.  Both proximal vein graft anastomoses were placed directly to the ascending aorta prior to removal of the aortic cross clamp.    One final dose of warm retrograde "hot shot" cardioplegia was administered through the coronary sinus catheter while all air was evacuated through the aortic root.  The aortic cross clamp was removed after a total cross clamp time of 120 minutes.  The proximal end of the left internal  mammary artery graft was sewn to the hood of the vein graft to the diagonal branch.  All proximal and distal coronary anastomoses were inspected for hemostasis and appropriate graft orientation. Epicardial pacing wires are fixed to the right ventricular outflow tract and to the right atrial appendage. The patient is rewarmed to 37C temperature. The aortic and left ventricular vents were removed.  The patient is weaned and disconnected from cardiopulmonary bypass.  The patient's rhythm at separation from bypass was sinus rhythm.  The patient was weaned from cardioplegic bypass without any inotropic support. Total cardiopulmonary bypass time for the operation was 170 minutes.  Followup transesophageal echocardiogram performed after separation from bypass revealed a well-seated bioprosthetic tissue valve in the aortic position that was functioning normally.  There was no perivalvular leak.  There were otherwise no changes from the preoperative exam.  The aortic and venous cannula were removed uneventfully. Protamine was administered to reverse the anticoagulation. The mediastinum and pleural space were inspected for hemostasis and irrigated with saline solution. The mediastinum and both pleural spaces were drained using 4 chest tubes placed through separate stab incisions inferiorly.  The soft tissues anterior to the aorta were reapproximated loosely. The sternum is closed with double strength sternal wire. The soft tissues anterior to the sternum were closed in multiple layers and the skin is closed with a running subcuticular skin closure.  The post-bypass portion of the operation was notable for stable rhythm and hemodynamics.  The patient tolerated the procedure well and is transported to the surgical intensive care in stable condition. There are no intraoperative complications. All sponge instrument and needle counts are  verified correct at completion of the operation.   Salvatore Decent. Cornelius Moras  MD 09/11/2011 3:46 PM

## 2011-09-11 NOTE — Progress Notes (Signed)
Inpatient Diabetes Program Recommendations  AACE/ADA: New Consensus Statement on Inpatient Glycemic Control (2009)  Target Ranges:  Prepandial:   less than 140 mg/dL      Peak postprandial:   less than 180 mg/dL (1-2 hours)      Critically ill patients:  140 - 180 mg/dL   Reason for Visit: Note patient post-surgery.  She has insulin pump which was stopped prior to surgery.  She has had Type one diabetes since age 66 and is therefore insulin dependent.  Currently on insulin drip.  Recommend continuing insulin drip until patient is alert and oriented and able to manage insulin pump. She see's Dr. March Rummage for her diabetes.  Insulin pump settings are: 12 a- 0.55 units/hour 3:30a-0.575 units/hour 8:30a-1.3 units/hour 11:00a-1.2 units/hour 12:30p-0.55 units/hour Total basal in 24 hours=16.175 units/hour  Her CHO coverage is: 12a- 1 unit for every 12 grams CHO 11:30a- 1 units for every 14 grams CHO 8:00p-1 unit for every 18 grams CHO  Her correction Factor is (one unit of insulin drops her CBG approximately) 12a-65 mg/dL 16X-09 mg/dL  Goal CBG's are: 60A=540 mg/dL 9W=119 mg/dL  Patient is very sensitive to insulin according to pump settings.  Her A1C is excellent=6.8%.  Daughter states she checks CBG's often and does a great job caring for herself.    Note: Will follow while in the hospital.

## 2011-09-11 NOTE — Procedures (Signed)
Extubation Procedure Note  Patient Details:   Name: Jaclyn [name] DOB: [birth year] MRN: [id number]   Airway Documentation:  Airway 7.5 mm (Active)  Secured at (cm) 23 cm 09/11/2011 10:11 PM  Measured From Lips 09/11/2011 10:11 PM  Secured Location Right 09/11/2011 10:11 PM  Secured By Pink Tape 09/11/2011 10:11 PM  Site Condition Dry 09/11/2011 10:11 PM    Evaluation  O2 sats: stable throughout Complications: No apparent complications Patient did tolerate procedure well. Bilateral Breath Sounds: Diminished;Rhonchi   Yes  Augustine Radar 09/11/2011, 11:02 PM

## 2011-09-11 NOTE — Plan of Care (Signed)
Problem: Phase II Progression Outcomes Goal: Cardiac index > or equal to 1.8 Outcome: Not Progressing persistant low CI at this time

## 2011-09-11 NOTE — Preoperative (Signed)
Beta Blockers   Reason not to administer Beta Blockers:Not Applicable 

## 2011-09-11 NOTE — Interval H&P Note (Signed)
History and Physical Interval Note:  09/11/2011 8:16 AM  Jaclyn [name]  has presented today for surgery, with the diagnosis of Aortic Stenosis CAD  The various methods of treatment have been discussed with the patient and family. After consideration of risks, benefits and other options for treatment, the patient has consented to  Procedure(s) (LRB): AORTIC VALVE REPLACEMENT (AVR)/CORONARY ARTERY BYPASS GRAFTING (CABG) (N/A) as a surgical intervention .  The patient's history has been reviewed, patient examined, no change in status, stable for surgery.  I have reviewed the patient's chart and labs.  Questions were answered to the patient's satisfaction.     OWEN,CLARENCE H

## 2011-09-11 NOTE — Progress Notes (Signed)
Dr. Cornelius Moras notified that patient became tachycardic and had extreme hypertension with Dopamine infusion; but index remains improved after turning off gtt, orders received

## 2011-09-12 ENCOUNTER — Inpatient Hospital Stay (HOSPITAL_COMMUNITY): Payer: Medicare Other

## 2011-09-12 ENCOUNTER — Encounter (HOSPITAL_COMMUNITY): Payer: Self-pay | Admitting: Thoracic Surgery (Cardiothoracic Vascular Surgery)

## 2011-09-12 LAB — GLUCOSE, CAPILLARY
Glucose-Capillary: 104 mg/dL — ABNORMAL HIGH (ref 70–99)
Glucose-Capillary: 106 mg/dL — ABNORMAL HIGH (ref 70–99)
Glucose-Capillary: 109 mg/dL — ABNORMAL HIGH (ref 70–99)
Glucose-Capillary: 114 mg/dL — ABNORMAL HIGH (ref 70–99)
Glucose-Capillary: 118 mg/dL — ABNORMAL HIGH (ref 70–99)
Glucose-Capillary: 119 mg/dL — ABNORMAL HIGH (ref 70–99)
Glucose-Capillary: 120 mg/dL — ABNORMAL HIGH (ref 70–99)
Glucose-Capillary: 122 mg/dL — ABNORMAL HIGH (ref 70–99)
Glucose-Capillary: 125 mg/dL — ABNORMAL HIGH (ref 70–99)
Glucose-Capillary: 131 mg/dL — ABNORMAL HIGH (ref 70–99)
Glucose-Capillary: 140 mg/dL — ABNORMAL HIGH (ref 70–99)
Glucose-Capillary: 145 mg/dL — ABNORMAL HIGH (ref 70–99)
Glucose-Capillary: 157 mg/dL — ABNORMAL HIGH (ref 70–99)

## 2011-09-12 LAB — POCT I-STAT 3, ART BLOOD GAS (G3+)
Acid-base deficit: 5 mmol/L — ABNORMAL HIGH (ref 0.0–2.0)
Bicarbonate: 20.9 mEq/L (ref 20.0–24.0)
Patient temperature: 36.4

## 2011-09-12 LAB — CREATININE, SERUM
Creatinine, Ser: 0.95 mg/dL (ref 0.50–1.10)
GFR calc Af Amer: 70 mL/min — ABNORMAL LOW (ref >90)

## 2011-09-12 LAB — CBC
Hemoglobin: 8.1 g/dL — ABNORMAL LOW (ref 12.0–15.0)
MCH: 27.8 pg (ref 26.0–34.0)
MCH: 27.9 pg (ref 26.0–34.0)
Platelets: 128 10*3/uL — ABNORMAL LOW (ref 150–400)
RBC: 2.91 MIL/uL — ABNORMAL LOW (ref 3.87–5.11)
RBC: 2.9 MIL/uL — ABNORMAL LOW (ref 3.87–5.11)
WBC: 7.3 10*3/uL (ref 4.0–10.5)
WBC: 9 10*3/uL (ref 4.0–10.5)

## 2011-09-12 LAB — BASIC METABOLIC PANEL
CO2: 21 mEq/L (ref 19–32)
Calcium: 7.8 mg/dL — ABNORMAL LOW (ref 8.4–10.5)
Chloride: 111 mEq/L (ref 96–112)
Glucose, Bld: 123 mg/dL — ABNORMAL HIGH (ref 70–99)
Sodium: 139 mEq/L (ref 135–145)

## 2011-09-12 LAB — POCT I-STAT, CHEM 8
BUN: 10 mg/dL (ref 6–23)
Chloride: 104 mEq/L (ref 96–112)
Creatinine, Ser: 1 mg/dL (ref 0.50–1.10)
Sodium: 139 mEq/L (ref 135–145)

## 2011-09-12 MED ORDER — MORPHINE SULFATE 2 MG/ML IJ SOLN
2.0000 mg | INTRAMUSCULAR | Status: DC | PRN
  Administered 2011-09-12 – 2011-09-13 (×3): 2 mg via INTRAVENOUS
  Filled 2011-09-12 (×3): qty 1

## 2011-09-12 MED ORDER — MIDAZOLAM HCL 2 MG/2ML IJ SOLN
2.0000 mg | Freq: Once | INTRAMUSCULAR | Status: AC
  Administered 2011-09-12: 2 mg via INTRAVENOUS
  Filled 2011-09-12: qty 2

## 2011-09-12 MED ORDER — FUROSEMIDE 10 MG/ML IJ SOLN
20.0000 mg | Freq: Four times a day (QID) | INTRAMUSCULAR | Status: AC
  Administered 2011-09-12 (×3): 20 mg via INTRAVENOUS

## 2011-09-12 MED FILL — Magnesium Sulfate Inj 50%: INTRAMUSCULAR | Qty: 10 | Status: AC

## 2011-09-12 MED FILL — Potassium Chloride Inj 2 mEq/ML: INTRAVENOUS | Qty: 40 | Status: AC

## 2011-09-12 NOTE — Progress Notes (Signed)
Patient s/p AVR & CABG srugery Day#1.  Has significant history of Type 1 diabetes.  Uses insulin pump at home to control CBGs.  Patient currently on IV insulin drip per GlucoStabilizer.  CBGs stable.  Upon visit with patient at ~11:05am, found patient up in chair with significant nausea and vomiting.  Patient being given IV Zofran for N&V.  Not ready to start back on insulin pump yet.  Patient also told me she does not feel up to starting her insulin pump yet (patent's daughter to bring insulin pump and supplies today).  RN told me they will keep patient on the insulin drip until she is feeling better.  Agree.  MD- Please keep patient on the IV insulin drip until she is feeling up to managing her pump by herself.  Will follow. Ambrose Finland RN, MSN, CDE Diabetes Coordinator Inpatient Diabetes Program [phone]

## 2011-09-12 NOTE — Progress Notes (Signed)
T[facility] BRIEF SICU PROGRESS NOTE  1 Day Post-Op  S/P Procedure(s) (LRB): AORTIC VALVE REPLACEMENT (AVR) (N/A) CORONARY ARTERY BYPASS GRAFTING (CABG) (N/A)   Stable day NSR BP stable O2 sats 100% on RA Diuresing some  Plan: Continue routine care  Kilie Rund H 09/12/2011 8:58 PM

## 2011-09-12 NOTE — Progress Notes (Signed)
   CARDIOTHORACIC SURGERY PROGRESS NOTE   R1 Day Post-Op Procedure(s) (LRB): AORTIC VALVE REPLACEMENT (AVR) (N/A) CORONARY ARTERY BYPASS GRAFTING (CABG) (N/A)  Subjective: Looks good.  Mild soreness in chest. A bit anxious but reports feeling well.  Objective: Vital signs: BP Readings from Last 1 Encounters:  09/12/11 130/56   Pulse Readings from Last 1 Encounters:  09/12/11 90   Resp Readings from Last 1 Encounters:  09/12/11 11   Temp Readings from Last 1 Encounters:  09/12/11 97.3 F (36.3 C)     Hemodynamics: PAP: (19-35)/(10-19) 32/16 mmHg CO:  [2.2 L/min-5.9 L/min] 4.2 L/min CI:  [1.1 L/min/m2-3 L/min/m2] 2.1 L/min/m2  Physical Exam:  Rhythm:   Sinus with PVC's  Breath sounds: clear  Heart sounds:  RRR  Incisions:  Dressings dry  Abdomen:  soft  Extremities:  warm   Intake/Output from previous day: 07/31 0701 - 08/01 0700 In: 9456.1 [P.O.:210; I.V.:5936.1; Blood:1450; NG/GT:60; IV Piggyback:1800] Out: 7500 [Urine:4600; Emesis/NG output:150; Blood:1500; Chest Tube:1250] Intake/Output this shift:    Lab Results:  Basename 09/12/11 0415 09/11/11 2031 09/11/11 2030  WBC 7.3 -- 9.6  HGB 8.1* 8.2* --  HCT 23.6* 24.0* --  PLT 103* -- 113*   BMET:  Basename 09/12/11 0415 09/11/11 2031 09/09/11 1336  NA 139 141 --  K 4.6 5.1 --  CL 111 110 --  CO2 21 -- 20  GLUCOSE 123* 121* --  BUN 10 9 --  CREATININE 0.80 0.80 --  CALCIUM 7.8* -- 10.4    CBG (last 3)   Basename 09/12/11 0701 09/12/11 0606 09/12/11 0501  GLUCAP 118* 114* 106*   ABG    Component Value Date/Time   PHART 7.326* 09/12/2011 0009   HCO3 20.9 09/12/2011 0009   TCO2 22 09/12/2011 0009   ACIDBASEDEF 5.0* 09/12/2011 0009   O2SAT 96.0 09/12/2011 0009   CXR: Clear - looks quite good  Assessment/Plan: S/P Procedure(s) (LRB): AORTIC VALVE REPLACEMENT (AVR) (N/A) CORONARY ARTERY BYPASS GRAFTING (CABG) (N/A)  Doing well POD1 Expected post op acute blood loss anemia, mild, stable Expected  post op volume excess, mild Type I diabetes mellitus, good glycemic control on Glucomander   Mobilize  Diuresis  D/C tubes and lines  Continue insulin drip and Glucomander until patient eating some and able to operate insulin pump   Elim Economou H 09/12/2011 7:53 AM

## 2011-09-12 NOTE — Progress Notes (Signed)
Updated pt's daughter Delia Heady via phone.  Reminded daughter to bring pt's insulin pump and humolog to hospital today.  Daughter appreciative of update and stated she would bring pump and insulin.

## 2011-09-12 NOTE — Care Management Note (Signed)
    Page 1 of 2   09/18/2011     1:38:36 PM   CARE MANAGEMENT NOTE 09/18/2011  Patient:  Jaclyn [name], Jaclyn [name]   Account Number:  [id number]  Date Initiated:  09/12/2011  Documentation initiated by:  AMERSON,JULIE  Subjective/Objective Assessment:   PT S/P CABG AND AVR ON 09/11/11.  PTA, PT INDEPENDENT, LIVES ALONE.     Action/Plan:   MET WITH PT AND DAUGHTERS TO DISCUSS DC PLANS.  DAUGHTER TO PROVIDE 24HR CARE AT DISCHARGE.  WILL FOLLOW FOR HOME NEEDS AS PT PROGRESSES.   Anticipated DC Date:  09/16/2011   Anticipated DC Plan:  HOME W HOME HEALTH SERVICES      DC Planning Services  CM consult      Choice offered to / List presented to:     DME arranged  Levan Hurst      DME agency  [facility]        Status of service:  Completed, signed off Medicare Important Message given?  YES (If response is "NO", the following Medicare IM given date fields will be blank) Date Medicare IM given:  09/16/2011 Date Additional Medicare IM given:    Discharge Disposition:  HOME/SELF CARE  Per UR Regulation:  Reviewed for med. necessity/level of care/duration of stay  If discussed at Long Length of Stay Meetings, dates discussed:   09/17/2011    Comments:  Contact: Dawson,Rebecca Daughter [phone]                Bazen,Vickie Daughter [phone] Lives in [place]  09-17-11 9:55am Avie Arenas, RNBSN [phone] Plan for discharge home on 8-7 with family.  No needs identified at this time.

## 2011-09-13 ENCOUNTER — Inpatient Hospital Stay (HOSPITAL_COMMUNITY): Payer: Medicare Other

## 2011-09-13 LAB — GLUCOSE, CAPILLARY
Glucose-Capillary: 77 mg/dL (ref 70–99)
Glucose-Capillary: 119 mg/dL — ABNORMAL HIGH (ref 70–99)
Glucose-Capillary: 185 mg/dL — ABNORMAL HIGH (ref 70–99)
Glucose-Capillary: 171 mg/dL — ABNORMAL HIGH (ref 70–99)
Glucose-Capillary: 81 mg/dL (ref 70–99)
Glucose-Capillary: 103 mg/dL — ABNORMAL HIGH (ref 70–99)
Glucose-Capillary: 205 mg/dL — ABNORMAL HIGH (ref 70–99)
Glucose-Capillary: 211 mg/dL — ABNORMAL HIGH (ref 70–99)

## 2011-09-13 LAB — CBC
HCT: 23.6 % — ABNORMAL LOW (ref 36.0–46.0)
MCH: 27.7 pg (ref 26.0–34.0)
MCV: 81.7 fL (ref 78.0–100.0)
Platelets: 120 10*3/uL — ABNORMAL LOW (ref 150–400)
RBC: 2.89 MIL/uL — ABNORMAL LOW (ref 3.87–5.11)
RDW: 16 % — ABNORMAL HIGH (ref 11.5–15.5)
WBC: 10 10*3/uL (ref 4.0–10.5)

## 2011-09-13 LAB — BASIC METABOLIC PANEL
CO2: 27 mEq/L (ref 19–32)
Calcium: 8.9 mg/dL (ref 8.4–10.5)
Chloride: 103 mEq/L (ref 96–112)
Creatinine, Ser: 1.05 mg/dL (ref 0.50–1.10)
Glucose, Bld: 88 mg/dL (ref 70–99)

## 2011-09-13 MED ORDER — SODIUM CHLORIDE 0.9 % IJ SOLN
3.0000 mL | Freq: Two times a day (BID) | INTRAMUSCULAR | Status: DC
  Administered 2011-09-14 – 2011-09-17 (×8): 3 mL via INTRAVENOUS

## 2011-09-13 MED ORDER — SODIUM CHLORIDE 0.9 % IV SOLN: 250.0000 mL | INTRAVENOUS | Status: DC | PRN

## 2011-09-13 MED ORDER — SODIUM CHLORIDE 0.9 % IJ SOLN: 3.0000 mL | INTRAMUSCULAR | Status: DC | PRN

## 2011-09-13 MED ORDER — LOSARTAN POTASSIUM 50 MG PO TABS
50.0000 mg | ORAL_TABLET | Freq: Every day | ORAL | Status: DC
  Administered 2011-09-14 – 2011-09-15 (×2): 50 mg via ORAL
  Filled 2011-09-13 (×3): qty 1

## 2011-09-13 MED ORDER — INSULIN PUMP
Freq: Three times a day (TID) | SUBCUTANEOUS | Status: DC
  Administered 2011-09-13: 0.9 via SUBCUTANEOUS
  Administered 2011-09-14: 0.8 via SUBCUTANEOUS
  Administered 2011-09-14: 0.9 via SUBCUTANEOUS
  Administered 2011-09-14: 0.4 via SUBCUTANEOUS
  Administered 2011-09-15: 02:00:00 via SUBCUTANEOUS
  Administered 2011-09-15: 2.1 via SUBCUTANEOUS
  Administered 2011-09-15: 0.7 via SUBCUTANEOUS
  Administered 2011-09-16: 2 via SUBCUTANEOUS
  Administered 2011-09-16: 2.2 via SUBCUTANEOUS
  Administered 2011-09-16: 2 via SUBCUTANEOUS
  Administered 2011-09-16: 1.2 via SUBCUTANEOUS
  Administered 2011-09-17: 2 via SUBCUTANEOUS
  Administered 2011-09-17: 0.6 via SUBCUTANEOUS
  Administered 2011-09-17: 0.9 via SUBCUTANEOUS
  Administered 2011-09-17 – 2011-09-18 (×2): 2.1 via SUBCUTANEOUS
  Administered 2011-09-18: 1 via SUBCUTANEOUS
  Administered 2011-09-18: 0.7 via SUBCUTANEOUS
  Filled 2011-09-13: qty 1

## 2011-09-13 MED ORDER — TRAMADOL HCL 50 MG PO TABS
50.0000 mg | ORAL_TABLET | ORAL | Status: DC | PRN
  Administered 2011-09-17: 50 mg via ORAL
  Filled 2011-09-13: qty 1

## 2011-09-13 MED ORDER — FUROSEMIDE 40 MG PO TABS
40.0000 mg | ORAL_TABLET | Freq: Every day | ORAL | Status: DC
  Administered 2011-09-13: 40 mg via ORAL
  Filled 2011-09-13 (×2): qty 1

## 2011-09-13 MED ORDER — POTASSIUM CHLORIDE CRYS ER 20 MEQ PO TBCR
20.0000 meq | EXTENDED_RELEASE_TABLET | Freq: Every day | ORAL | Status: DC
  Filled 2011-09-13: qty 1

## 2011-09-13 MED ORDER — VITAMIN D3 25 MCG (1000 UNIT) PO TABS
1000.0000 [IU] | ORAL_TABLET | Freq: Every day | ORAL | Status: DC
  Administered 2011-09-13 – 2011-09-18 (×6): 1000 [IU] via ORAL
  Filled 2011-09-13 (×6): qty 1

## 2011-09-13 MED ORDER — MOVING RIGHT ALONG BOOK
Freq: Once | Status: AC
  Administered 2011-09-13: 09:00:00
  Filled 2011-09-13: qty 1

## 2011-09-13 MED ORDER — METOPROLOL TARTRATE 25 MG PO TABS
25.0000 mg | ORAL_TABLET | Freq: Two times a day (BID) | ORAL | Status: DC
  Administered 2011-09-13 (×2): 25 mg via ORAL
  Filled 2011-09-13 (×4): qty 1

## 2011-09-13 MED FILL — Lidocaine HCl IV Inj 20 MG/ML: INTRAVENOUS | Qty: 5 | Status: AC

## 2011-09-13 MED FILL — Sodium Bicarbonate IV Soln 8.4%: INTRAVENOUS | Qty: 50 | Status: AC

## 2011-09-13 MED FILL — Sodium Chloride Irrigation Soln 0.9%: Qty: 3000 | Status: AC

## 2011-09-13 MED FILL — Electrolyte-R (PH 7.4) Solution: INTRAVENOUS | Qty: 7000 | Status: AC

## 2011-09-13 MED FILL — Heparin Sodium (Porcine) Inj 1000 Unit/ML: INTRAMUSCULAR | Qty: 30 | Status: AC

## 2011-09-13 MED FILL — Sodium Chloride IV Soln 0.9%: INTRAVENOUS | Qty: 1000 | Status: AC

## 2011-09-13 MED FILL — Mannitol IV Soln 20%: INTRAVENOUS | Qty: 500 | Status: AC

## 2011-09-13 MED FILL — Heparin Sodium (Porcine) Inj 1000 Unit/ML: INTRAMUSCULAR | Qty: 10 | Status: AC

## 2011-09-13 NOTE — Progress Notes (Signed)
Patient transitioned off IV insulin drip back to her insulin pump today.  Insulin drip and insulin pump overlapped by 1 hour.  CBG at 1600pm was 179 mg/dl.  Patient bolused self with 0.5 units insulin per pump per RN note.  Insulin pump settings are as follows:  Basal rates: 12am-3:30am:     0.55 units /hr 3:30am-8:30am:  0.575 units per hr 8:30am-11am:      1.3 units/hr 11am-12:30pm:    1.2 units /hr 12:30pm-12am:    0.55 units/hr  Patient receives a total of 16.175 units total basal insulin per 24 hour period.  Carbohydrate ratio:  12am:      1 unit:12 grams carbs 11:30am: 1 unit: 14 grams carbs 8pm:        1 unit: 18 grams carbs  Correction factor:  12am: 1 unit for every 65 mg/dl above target CBG 16XW: 1 unit for every 60 mg/dl above target CBG  Target CBG: 12am: 120 mg/dl 9pm:  960 mg/dl  Patient A&O and able to independently manage insulin pump.  Patient contract signed and placed in shadow chart.  Patient instrcuted on how to use insulin pump flow sheet at bedside.  Insulin pump flowsheet in Doc Flowsheets in EPIC needs to be completed Qshift per RN- RN, [facility] aware.  Patient told me she checks her CBGs frequently at home.  Told patient we will be checking her CBGs before all meals, at bedtime, and at 2am.  Informed patient that we will be glad to check her CBGs more often as well, if she so desires.  Patient appreciative of this and said she will ask for extra CBG checks if she needs them.  Spoke with RN, Hope.  Reminded RN to document all insulin boluses into EPIC.  Will follow. Ambrose Finland RN, MSN, CDE Diabetes Coordinator Inpatient Diabetes Program [phone]

## 2011-09-13 NOTE — Progress Notes (Signed)
   CARDIOTHORACIC SURGERY PROGRESS NOTE   R2 Days Post-Op Procedure(s) (LRB): AORTIC VALVE REPLACEMENT (AVR) (N/A) CORONARY ARTERY BYPASS GRAFTING (CABG) (N/A)  Subjective: Looks good.  Mild soreness in chest.  Some SOB but breathing comfortably on room air.  Starting to eat some.  Objective: Vital signs: BP Readings from Last 1 Encounters:  09/13/11 146/51   Pulse Readings from Last 1 Encounters:  09/13/11 99   Resp Readings from Last 1 Encounters:  09/13/11 18   Temp Readings from Last 1 Encounters:  09/13/11 97.6 F (36.4 C) Oral    Hemodynamics:    Physical Exam:  Rhythm:   sinus  Breath sounds: Few basilar crackles  Heart sounds:  RRR  Incisions:  Clean and dry  Abdomen:  soft  Extremities:  warm   Intake/Output from previous day: 08/01 0701 - 08/02 0700 In: 1685.3 [P.O.:1020; I.V.:559.3; IV Piggyback:106] Out: 2925 [Urine:2475; Chest Tube:450] Intake/Output this shift:    Lab Results:  Basename 09/13/11 0400 09/12/11 1734 09/12/11 1700  WBC 10.0 -- 9.0  HGB 8.0* 8.5* --  HCT 23.6* 25.0* --  PLT 120* -- 128*   BMET:  Basename 09/13/11 0400 09/12/11 1734 09/12/11 0415  NA 137 139 --  K 4.1 4.0 --  CL 103 104 --  CO2 27 -- 21  GLUCOSE 88 118* --  BUN 14 10 --  CREATININE 1.05 1.00 --  CALCIUM 8.9 -- 7.8*    CBG (last 3)   Basename 09/13/11 0645 09/13/11 0500 09/13/11 0400  GLUCAP 119* 77 86   ABG    Component Value Date/Time   PHART 7.326* 09/12/2011 0009   HCO3 20.9 09/12/2011 0009   TCO2 21 09/12/2011 1734   ACIDBASEDEF 5.0* 09/12/2011 0009   O2SAT 96.0 09/12/2011 0009   CXR: PORTABLE CHEST - 1 VIEW  Comparison: 08/01/2013and 12/12/2008  Findings: 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 A Swan-Ganz introducer sheath remains in  place but the Swan-Ganz catheter has been removed.  The patient is status post median sternotomy and aortic  valve  replacement. Cardiomegaly is stable in degree and a mild degree of  aortic ectasia is also noted.  Both chest tubes have been removed. There is some persistent  density at the left lung base with a small left pleural effusion  and this likely represents a combination of atelectasis and  effusion. The possibility of an associated left lower lobe  infiltrate is not excluded. Milder atelectasis at the right lung  base is stable. No new areas of atelectasis or infiltrate are  seen. Chronic right hilar retraction and interstitial scarring at  the right lung apex is noted.  Bony structures are stable  IMPRESSION:  Stable cardiopulmonary appearance post chest tube removal with  persistent left lower lobe atelectasis and small left effusion.  Original Report Authenticated By: Bertha Stakes, M.D.   Assessment/Plan: S/P Procedure(s) (LRB): AORTIC VALVE REPLACEMENT (AVR) (N/A) CORONARY ARTERY BYPASS GRAFTING (CABG) (N/A)  Doing well POD2 Expected post op acute blood loss anemia, moderate, stable Expected post op volume excess, mild, diuresing Type I diabetes mellitus, excellent glycemic control on insulin drip but ready to resume insulin pump   Mobilize and pulm toilet  Continue diuresis  Increase beta blocker  Resume insulin pump  Transfer 2000  Routine care   Lavanda Nevels H 09/13/2011 8:25 AM

## 2011-09-13 NOTE — Progress Notes (Signed)
Pt requested CBG at 1810, Her CBG was 205. Pt gave herself 1.2 bolus of insulin via her insulin pump.

## 2011-09-13 NOTE — Progress Notes (Signed)
Glucostabilizer stopper per protocol, pt home insulin pump attached. Will continue to monitor.  Jaclyn [name]

## 2011-09-13 NOTE — Progress Notes (Signed)
Spoke with diabetes coordinator and she stated that pt can check have her CBG checked as much as pt would like and pt may give bolus as much as needed. Checked CBG and was 179, pt gave herself a bolus of 0.5units in insulin pump at 1540.

## 2011-09-14 ENCOUNTER — Inpatient Hospital Stay (HOSPITAL_COMMUNITY): Payer: Medicare Other

## 2011-09-14 LAB — CBC
HCT: 23.7 % — ABNORMAL LOW (ref 36.0–46.0)
Hemoglobin: 7.9 g/dL — ABNORMAL LOW (ref 12.0–15.0)
MCHC: 33.3 g/dL (ref 30.0–36.0)
RDW: 15.6 % — ABNORMAL HIGH (ref 11.5–15.5)
WBC: 11.2 10*3/uL — ABNORMAL HIGH (ref 4.0–10.5)

## 2011-09-14 LAB — GLUCOSE, CAPILLARY
Glucose-Capillary: 200 mg/dL — ABNORMAL HIGH (ref 70–99)
Glucose-Capillary: 173 mg/dL — ABNORMAL HIGH (ref 70–99)
Glucose-Capillary: 163 mg/dL — ABNORMAL HIGH (ref 70–99)

## 2011-09-14 LAB — BASIC METABOLIC PANEL
BUN: 21 mg/dL (ref 6–23)
Chloride: 97 mEq/L (ref 96–112)
GFR calc Af Amer: 73 mL/min — ABNORMAL LOW (ref >90)
GFR calc non Af Amer: 63 mL/min — ABNORMAL LOW (ref >90)
Potassium: 3.7 mEq/L (ref 3.5–5.1)
Sodium: 132 mEq/L — ABNORMAL LOW (ref 135–145)

## 2011-09-14 MED ORDER — METOPROLOL TARTRATE 50 MG PO TABS
50.0000 mg | ORAL_TABLET | Freq: Two times a day (BID) | ORAL | Status: DC
  Administered 2011-09-14 – 2011-09-15 (×3): 50 mg via ORAL
  Filled 2011-09-14 (×4): qty 1

## 2011-09-14 MED ORDER — POTASSIUM CHLORIDE CRYS ER 20 MEQ PO TBCR
40.0000 meq | EXTENDED_RELEASE_TABLET | Freq: Every day | ORAL | Status: DC
  Administered 2011-09-14 – 2011-09-16 (×3): 40 meq via ORAL
  Filled 2011-09-14: qty 1
  Filled 2011-09-14 (×4): qty 2

## 2011-09-14 MED ORDER — MAGNESIUM HYDROXIDE 400 MG/5ML PO SUSP
30.0000 mL | Freq: Every day | ORAL | Status: DC | PRN
  Administered 2011-09-14: 30 mL via ORAL
  Filled 2011-09-14: qty 30

## 2011-09-14 MED ORDER — FUROSEMIDE 40 MG PO TABS
40.0000 mg | ORAL_TABLET | Freq: Two times a day (BID) | ORAL | Status: DC
  Administered 2011-09-14 – 2011-09-18 (×8): 40 mg via ORAL
  Filled 2011-09-14 (×10): qty 1

## 2011-09-14 NOTE — Progress Notes (Signed)
Pt ambulated 300 ft with min assist and RW.  No complaints of pain, SOB, or dizziness.   Returned to bed with call bell in reach, family at bedside.  Will con't plan of care.

## 2011-09-14 NOTE — Progress Notes (Signed)
                    [address]            [place] [address]          [phone]     3 Days Post-Op Procedure(s) (LRB): AORTIC VALVE REPLACEMENT (AVR) (N/A) CORONARY ARTERY BYPASS GRAFTING (CABG) (N/A)  Subjective: Feels well, no complaints. +productive cough this am.   Objective: Vital signs in last 24 hours: Patient Vitals for the past 24 hrs:  BP Temp Temp src Pulse Resp SpO2 Weight  09/14/11 0300 160/80 mmHg 98.5 F (36.9 C) Oral 97  16  97 % -  09/14/11 0034 - - - - - - 189 lb 1.6 oz (85.775 kg)  09/13/11 2021 156/75 mmHg 98.7 F (37.1 C) Oral 100  18  94 % -  09/13/11 1100 127/64 mmHg 98.5 F (36.9 C) Oral 86  18  97 % -  09/13/11 1000 138/63 mmHg - - 100  20  97 % -  09/13/11 0900 127/49 mmHg - - 102  23  97 % -   Current Weight  09/14/11 189 lb 1.6 oz (85.775 kg)   Pre-op wt= 82.5 kg  Intake/Output from previous day: 08/02 0701 - 08/03 0700 In: 387.2 [P.O.:360; I.V.:27.2] Out: 100 [Urine:100]  CBGs 179-200-205-211-173-184  PHYSICAL EXAM:  Heart: RRR, freq PVCs Lungs: diminished BS in bases bilaterally, few crackles on left Wound: clean and dry Extremities: + LE edema    Lab Results: CBC: Basename 09/14/11 0615 09/13/11 0400  WBC 11.2* 10.0  HGB 7.9* 8.0*  HCT 23.7* 23.6*  PLT 151 120*   BMET:  Basename 09/14/11 0615 09/13/11 0400  NA 132* 137  K 3.7 4.1  CL 97 103  CO2 26 27  GLUCOSE 184* 88  BUN 21 14  CREATININE 0.92 1.05  CALCIUM 9.2 8.9    PT/INR:  Basename 09/11/11 1539  LABPROT 23.8*  INR 2.09*   BJY:NWGNFAOZHY:  1. Removal of central line.  2. Stable pleural effusions, left greater than right.    Assessment/Plan: S/P Procedure(s) (LRB): AORTIC VALVE REPLACEMENT (AVR) (N/A) CORONARY ARTERY BYPASS GRAFTING (CABG) (N/A)  CV- BPs trending up and slightly tachy with freq PVCs on telemetry this am.  Back on Cozaar. Will further increase beta blocker.  Vol overload- diurese. Will give extra dose of  Lasix this pm.  Hypokalemia- will replace K+ and monitor.  Having frequent PVCs.   T1DM- insulin pump restarted and CBGs slowly trending down.  Continue to monitor.  GI- requests LOC today for constipation.  CRPI, pulm toilet.   LOS: 3 days    Rosaly Labarbera H 09/14/2011

## 2011-09-14 NOTE — Plan of Care (Signed)
Problem: Phase III Progression Outcomes Goal: Transfer to PCTU/Telemetry POD Outcome: Completed/Met Date Met:  09/14/11 Pt on a regular tele bed. Progressing well. Goal: Dysrhythmias controlled Outcome: Not Progressing Continues to have irregular rhythm. Afib but controlled rate. Goal: Hemodynamically stable Outcome: Not Progressing BP still elevated. Loppressor PO increased to 50mg  from 25mg .

## 2011-09-15 LAB — BASIC METABOLIC PANEL
BUN: 25 mg/dL — ABNORMAL HIGH (ref 6–23)
Calcium: 9.4 mg/dL (ref 8.4–10.5)
GFR calc non Af Amer: 65 mL/min — ABNORMAL LOW (ref >90)
Glucose, Bld: 157 mg/dL — ABNORMAL HIGH (ref 70–99)
Sodium: 132 mEq/L — ABNORMAL LOW (ref 135–145)

## 2011-09-15 LAB — TYPE AND SCREEN: Unit division: 0

## 2011-09-15 LAB — GLUCOSE, CAPILLARY: Glucose-Capillary: 174 mg/dL — ABNORMAL HIGH (ref 70–99)

## 2011-09-15 MED ORDER — POLYETHYLENE GLYCOL 3350 17 G PO PACK
17.0000 g | PACK | Freq: Every day | ORAL | Status: DC
  Administered 2011-09-15 – 2011-09-16 (×2): 17 g via ORAL
  Filled 2011-09-15 (×4): qty 1

## 2011-09-15 MED ORDER — LOSARTAN POTASSIUM 50 MG PO TABS
100.0000 mg | ORAL_TABLET | Freq: Every day | ORAL | Status: DC
  Administered 2011-09-16 – 2011-09-18 (×3): 100 mg via ORAL
  Filled 2011-09-15 (×4): qty 2

## 2011-09-15 MED ORDER — BISACODYL 5 MG PO TBEC: 5.0000 mg | DELAYED_RELEASE_TABLET | Freq: Every day | ORAL | Status: DC | PRN

## 2011-09-15 MED ORDER — METOPROLOL TARTRATE 50 MG PO TABS
75.0000 mg | ORAL_TABLET | Freq: Two times a day (BID) | ORAL | Status: DC
  Administered 2011-09-15 – 2011-09-16 (×3): 75 mg via ORAL
  Filled 2011-09-15 (×5): qty 1

## 2011-09-15 NOTE — Progress Notes (Signed)
Pt ambulating hall with daughter and RW.

## 2011-09-15 NOTE — Progress Notes (Signed)
Pt ambulated 250 with RW and minimal assistance.  Returned to recliner with call and IS in reach.  Will con't plan of care.

## 2011-09-15 NOTE — Plan of Care (Signed)
Problem: Phase III Progression Outcomes Goal: Advance to regular diet without nausea Outcome: Not Progressing Nausea and vomiting relieved with zofran.

## 2011-09-15 NOTE — Progress Notes (Addendum)
                    [address]            [place] [address]          [phone]     4 Days Post-Op Procedure(s) (LRB): AORTIC VALVE REPLACEMENT (AVR) (N/A) CORONARY ARTERY BYPASS GRAFTING (CABG) (N/A)  Subjective: "Rough night".  She became SOB overnight and was put back on O2.  Currently on RA and breathing comfortably.  Also, still c/o constipation, despite MOM and Dulcolax.   Objective: Vital signs in last 24 hours: Patient Vitals for the past 24 hrs:  BP Temp Temp src Pulse Resp SpO2 Weight  09/15/11 0449 161/79 mmHg 98.2 F (36.8 C) Oral 78  16  93 % 185 lb (83.915 kg)  09/14/11 2002 164/68 mmHg - - 94  - - -  09/14/11 1932 190/70 mmHg 98.7 F (37.1 C) Oral 92  16  95 % -  09/14/11 1353 151/74 mmHg 98.8 F (37.1 C) Oral 79  18  96 % -   Current Weight  09/15/11 185 lb (83.915 kg)  Pre-op wt= 82.5 kg    Intake/Output from previous day: 08/03 0701 - 08/04 0700 In: 265 [P.O.:265] Out: -   CBGs [phone]  PHYSICAL EXAM:  Heart: RRR Lungs: few crackles in bases bilaterally Wound: clean and dry Extremities: + LE edema   Lab Results: CBC: Basename 09/14/11 0615 09/13/11 0400  WBC 11.2* 10.0  HGB 7.9* 8.0*  HCT 23.7* 23.6*  PLT 151 120*   BMET:  Basename 09/15/11 0625 09/14/11 0615  NA 132* 132*  K 4.3 3.7  CL 94* 97  CO2 31 26  GLUCOSE 157* 184*  BUN 25* 21  CREATININE 0.90 0.92  CALCIUM 9.4 9.2    PT/INR: No results found for this basename: LABPROT,INR in the last 72 hours    Assessment/Plan: S/P Procedure(s) (LRB): AORTIC VALVE REPLACEMENT (AVR) (N/A) CORONARY ARTERY BYPASS GRAFTING (CABG) (N/A)  CV- RN noted in computer that pt had been in AFib overnight and her beta blocker decreased, however, I reviewed the telemetry from overnight and she has been in SR all night.  This was evidently documented on the wrong chart.  Her BPs have been elevated.  Will increase Cozaar and watch.  T1DM- sugars a little better.   Back on insulin pump.  Vol overload- still quite edematous. Will give extra Lasix again today. Check CXR in am to f/u L effusion.  LOC this am.   LOS: 4 days    COLLINS,GINA H 09/15/2011   patient examined and medical record reviewed,agree with above note. VAN TRIGT III,Jaclyn [name] 09/15/2011

## 2011-09-15 NOTE — Plan of Care (Signed)
Problem: Phase III Progression Outcomes Goal: Hemodynamically stable Outcome: Progressing Responded to 75mg  of metoprolol po. Losartan started too. Will continue to monitor. Goal: Pain controlled on oral analgesia Outcome: Completed/Met Date Met:  09/15/11 Denies pain. On scheduled Acetaminophen. Declined oxy.

## 2011-09-16 ENCOUNTER — Inpatient Hospital Stay (HOSPITAL_COMMUNITY): Payer: Medicare Other

## 2011-09-16 LAB — GLUCOSE, CAPILLARY
Glucose-Capillary: 138 mg/dL — ABNORMAL HIGH (ref 70–99)
Glucose-Capillary: 205 mg/dL — ABNORMAL HIGH (ref 70–99)

## 2011-09-16 MED ORDER — METOPROLOL TARTRATE 25 MG PO TABS: 75.0000 mg | ORAL_TABLET | Freq: Two times a day (BID) | ORAL | Status: DC

## 2011-09-16 MED ORDER — FUROSEMIDE 40 MG PO TABS: 40.0000 mg | ORAL_TABLET | Freq: Every day | ORAL | Status: DC

## 2011-09-16 MED ORDER — LOSARTAN POTASSIUM 100 MG PO TABS: 100.0000 mg | ORAL_TABLET | Freq: Every day | ORAL | Status: DC

## 2011-09-16 MED ORDER — POTASSIUM CHLORIDE CRYS ER 20 MEQ PO TBCR: 20.0000 meq | EXTENDED_RELEASE_TABLET | Freq: Every day | ORAL | Status: DC

## 2011-09-16 MED ORDER — TRAMADOL HCL 50 MG PO TABS: 50.0000 mg | ORAL_TABLET | ORAL | Status: DC | PRN

## 2011-09-16 MED ORDER — MAGNESIUM CITRATE PO SOLN
1.0000 | Freq: Once | ORAL | Status: AC
  Administered 2011-09-16: 1 via ORAL
  Filled 2011-09-16: qty 296

## 2011-09-16 NOTE — Progress Notes (Signed)
CARDIAC REHAB PHASE I   PRE:  Rate/Rhythm: 77 SR  BP:  Supine: 110/56  Sitting: 110/56  Standing: 86/50   SaO2: 91 RA  MODE:  Ambulation: 240 ft   POST:  Rate/Rhythem: 84  BP:  Supine: 120/54  Sitting:   Standing:    SaO2: 94 [place] [id number] Have attempted to ambulate pt several times today, getting bath and in bathroom. On arrival pt i bed with c/o of nausea and no BM. Pt willing to try to walk. States that her her feels a little dizzy. Checked BP orthostatic, see above. Pt states that she feels dizzy with sitting and standing. Used walker and assisted X 1 to ambulate. Pt able to walk 240 feet BP after walk 120/54. Pt back to bed after walk with call light inn reach.Pt did c/o of dizziness during walk.    Jaclyn [name]

## 2011-09-16 NOTE — Progress Notes (Signed)
Pt's SBP has been maintaining at 150's-190 being the highest. Dr. Zenaida Niece Tright increased Metoprolol to 75mg  BID. Pt has been responding to Metoprolol very well.

## 2011-09-16 NOTE — Progress Notes (Addendum)
5 Days Post-Op Procedure(s) (LRB): AORTIC VALVE REPLACEMENT (AVR) (N/A) CORONARY ARTERY BYPASS GRAFTING (CABG) (N/A)  Subjective:  Jaclyn [name] complains of not feeling well this morning. She complains of nausea and has still not been able to have a bowel movement  Objective: Vital signs in last 24 hours: Temp:  [97.8 F (36.6 C)-98.4 F (36.9 C)] 98.4 F (36.9 C) (08/05 0359) Pulse Rate:  [81-92] 81  (08/05 0359) Cardiac Rhythm:  [-] Normal sinus rhythm (08/04 2037) Resp:  [17-18] 17  (08/05 0359) BP: (148-181)/(72-78) 148/74 mmHg (08/05 0359) SpO2:  [90 %-94 %] 90 % (08/05 0359) Weight:  [179 lb 7.3 oz (81.4 kg)] 179 lb 7.3 oz (81.4 kg) (08/05 0359)  Intake/Output from previous day: 08/04 0701 - 08/05 0700 In: 120 [P.O.:120] Out: -   General appearance: alert, cooperative and no distress Heart: regular rate and rhythm Lungs: diminished breath sounds bibasilar Abdomen: soft, non-tender; bowel sounds normal; no masses,  no organomegaly Extremities: edema trace Wound: clean and dry  Lab Results:  [facility] 09/14/11 0615  WBC 11.2*  HGB 7.9*  HCT 23.7*  PLT 151   BMET:  Basename 09/15/11 0625 09/14/11 0615  NA 132* 132*  K 4.3 3.7  CL 94* 97  CO2 31 26  GLUCOSE 157* 184*  BUN 25* 21  CREATININE 0.90 0.92  CALCIUM 9.4 9.2    PT/INR: No results found for this basename: LABPROT,INR in the last 72 hours ABG    Component Value Date/Time   PHART 7.326* 09/12/2011 0009   HCO3 20.9 09/12/2011 0009   TCO2 21 09/12/2011 1734   ACIDBASEDEF 5.0* 09/12/2011 0009   O2SAT 96.0 09/12/2011 0009   CBG (last 3)   Basename 09/16/11 0158 09/15/11 2131 09/15/11 1617  GLUCAP 138* 189* 151*    Assessment/Plan: S/P Procedure(s) (LRB): AORTIC VALVE REPLACEMENT (AVR) (N/A) CORONARY ARTERY BYPASS GRAFTING (CABG) (N/A)  1. CV- NSR, occasional PVCs, pressure remains elevated running 140s SBP, Lopressor increased yesterday to 75mg  BID, Cozaar 100mg  QD 2. Pulm- small left pleural  effusion, continue IS, Lasix 3. LOC Constipation- ordered Mag Citrate 4. DM- patients insulin pump resumed yesterday 5. Dispo- patient progressing slowly, once bowel issues resolved, and rhythm stabilized can likely be discharged home in next 24-48 hours   LOS: 5 days    Jaclyn [name], Jaclyn [name] 09/16/2011    Chart reviewed, patient examined, agree with above. If no BM with magnesium citrate would try some lactulose.

## 2011-09-16 NOTE — Progress Notes (Signed)
Inpatient Diabetes Program Recommendations  AACE/ADA: New Consensus Statement on Inpatient Glycemic Control (2013)  Target Ranges:  Prepandial:   less than 140 mg/dL      Peak postprandial:   less than 180 mg/dL (1-2 hours)      Critically ill patients:  140 - 180 mg/dL   Reason for Visit: Insulin pump assessment   Talked with patient who states she inserted a new infusion set and new insulin barrel with Novolog insulin.   Pump site close to previous site, but slightly higher in mid-left abdomen. Pt states that at times her insulin does  Not seem to be infusing and she will have to give herself either a subcutaneous injection or change out her site and insulin. Note: Thank you, Lenor Coffin, RN, CNS, Diabetes Coordinator [phone])

## 2011-09-17 LAB — GLUCOSE, CAPILLARY
Glucose-Capillary: 134 mg/dL — ABNORMAL HIGH (ref 70–99)
Glucose-Capillary: 152 mg/dL — ABNORMAL HIGH (ref 70–99)

## 2011-09-17 MED ORDER — METOPROLOL TARTRATE 100 MG PO TABS: 100.0000 mg | ORAL_TABLET | Freq: Two times a day (BID) | ORAL | Status: DC

## 2011-09-17 MED ORDER — METOPROLOL TARTRATE 100 MG PO TABS
100.0000 mg | ORAL_TABLET | Freq: Two times a day (BID) | ORAL | Status: DC
  Administered 2011-09-17 – 2011-09-18 (×3): 100 mg via ORAL
  Filled 2011-09-17 (×4): qty 1

## 2011-09-17 NOTE — Discharge Summary (Signed)
[address]            [place] [address]          [phone]         Discharge Summary  Name: Jaclyn [name] DOB: [birth year] 66 y.o. MRN: [id number]   Admission Date: 09/11/2011 Discharge Date:     Admitting Diagnosis: Severe aortic stenosis Severe left main and 3 vessel coronary artery disease  Discharge Diagnosis:  Severe aortic stenosis Severe left main and 3 vessel coronary artery disease  Past Medical History  Diagnosis Date  . Palpitation   . Allergic rhinitis   . Dermatitis   . Fibrocystic disease of breast   . Callus   . Reflux esophagitis   . Corns   . Depression   . Dizziness   . Fatigue   . Foot pain   . Murmur   . Gastroparesis   . Hematuria   . Hyperlipidemia   . Hypothyroidism   . Joint pain   . Menopause   . Microalbuminuria   . Myalgia   . Osteoporosis   . Plantar fasciitis   . Keratosis   . SOB (shortness of breath)   . Vitamin d deficiency   . Chest pain   . Orthostatic hypotension   . Radiation fibrosis of lung   . 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 Aortic stenosis, severe 08/22/2011  . Coronary artery disease 08/21/2011    Cath 08/21/2011  . S/P radiation therapy 08/22/2011    Radiation therapy to chest and mediastinum for small cell carcinoma of the lung  . Family history of anesthesia complication     family history of N&V  . Anemia     blood transfusion- 6 yrs. ago by Dr. Arline Asp   . HTN (hypertension)     Dr. Eden Emms at [facility] takes care of cardiac needs, last stress test- 06/2011  . GERD (gastroesophageal reflux disease)   . Seizures     32 yrs. ago, was on phenobarbital for sometime, off it for 20 yrs.   . Peripheral neuropathy     neuropathy   . Lung cancer 06/12/1997    Limited stage Small Cell Carcinoma of the lung treated with carboplatin and VP16 + external beam radiation therapy to  mediastinum and chest (1999)   . DM (diabetes mellitus)     type I - treated with insulin pump /w x32yrs  . Arthritis     knees  . S/P aortic valve replacement with bioprosthetic valve 09/11/2011    21 mm [employer] [facility] Ease pericardial tissue valve  . S/P CABG x 3 09/11/2011    Free LIMA to LAD, SVG to D1, SVG to RPL, EVH via right thigh and leg     Procedures:  AORTIC VALVE REPLACEMENT (21 mm [facility] Ease pericardial tissue valve)  CORONARY ARTERY BYPASS GRAFTING x 3 (Free left internal mammary artery to distal left anterior descending, saphenous vein graft to right posterolateral, saphenous vein graft to first diagonal), endoscopic vein harvest right leg on 09/11/2011    HPI:  The patient is a 66 y.o. female with multiple medical problems including history of hypertension, hyperlipidemia, long-standing type 1 diabetes mellitus, previous history of tobacco abuse and lung cancer. In the past, the patient was told that she  had a heart murmur attributed to mitral valve prolapse. The patient recently was referred for cardiac evaluation due to the development of frequent palpitations or dizzy spells, atypical chest pain, and exertional shortness of breath. She has had mild exertional shortness of breath dating back to her history of the heavy tobacco abuse and lung cancer. She was diagnosed with small cell carcinoma of the lung approximately 12-14 years ago. This was treated with chemo and radiation therapy. She quit smoking at that time. Since then, she has remained stable until over the last year or so the patient has developed somewhat more noticeable exertional shortness of breath and progressive fatigue. The patient has also developed frequent tachypalpitations and some atypical episodes of chest pressure that do not seem to be related to physical activity. She has developed significant fatigue and notes that she gets tired much more easily than she used to. She has had several dizzy spells  without syncope. She was evaluated by Dr. Eden Emms and underwent 2-D echocardiogram confirming the presence of moderate to severe aortic stenosis with normal left ventricular systolic function. Stress myoview was performed demonstrating stress-induced EKG changes and ischemia on nuclear imaging. Elective left and right heart catheterization confirmed the presence of severe aortic stenosis and also documented the presence of severe left main disease and three-vessel coronary artery disease. The patient was referred to Dr. Cornelius Moras for elective surgical consultation. He felt she would benefit from aortic valve replacement and coronary artery bypass grafting.  All risks, benefits and alternatives of surgery were explained in detail, and the patient agreed to proceed.   Hospital Course:  The patient was admitted to [facility] on 09/11/2011. The patient was taken to the operating room and underwent the above procedure.    The postoperative course has generally been uneventful. Her insulin pump has been restarted and her glycemic control is improving. She has been somewhat hypertensive postoperatively and was started on a beta blocker and her home ARB, both of which have been titrated upward as needed. She has been volume overloaded and has been aggressively diuresed. She has also had small pleural effusions on chest x-ray which are responding to diuresis. She continues to make steady progress. She is ambulating in the halls without difficulty. She is tolerating a regular diet. She has remained afebrile and in normal sinus rhythm. We anticipate discharge home within the next 24 hours provided she remains stable.      Recent vital signs:  Filed Vitals:   09/17/11 0849  BP: 126/54  Pulse: 82  Temp:   Resp:     Recent laboratory studies:  CBC:No results found for this basename: WBC:2,HGB:2,HCT:2,PLT:2 in the last 72 hours BMET:  [facility] 09/15/11 0625  NA 132*  K 4.3  CL 94*  CO2 31  GLUCOSE 157*  BUN  25*  CREATININE 0.90  CALCIUM 9.4    PT/INR: No results found for this basename: LABPROT,INR in the last 72 hours   Discharge Medications:   Medication List  As of 09/17/2011  9:06 AM   TAKE these medications         aspirin 325 MG EC tablet   Take 325 mg by mouth daily.      cholecalciferol 1000 UNITS tablet   Commonly known as: VITAMIN D   Take 1,000 Units by mouth daily.      EX-LAX PO   Take 2 tablets by mouth daily as needed. For constipation      furosemide 40 MG tablet  Commonly known as: LASIX   Take 1 tablet (40 mg total) by mouth daily. For 5 days      insulin pump 100 unit/ml Soln   Inject into the skin. humalog- basal rate continuously & boluses as appropriate to her carbs & blood glucose      levothyroxine 112 MCG tablet   Commonly known as: SYNTHROID, LEVOTHROID   Take 112 mcg by mouth daily before breakfast.      losartan 100 MG tablet   Commonly known as: COZAAR   Take 1 tablet (100 mg total) by mouth daily before breakfast.      magnesium hydroxide 400 MG/5ML suspension   Commonly known as: MILK OF MAGNESIA   Take 30 mLs by mouth daily as needed. For constipation      metoprolol 100 MG tablet   Commonly known as: LOPRESSOR   Take 1 tablet (100 mg total) by mouth 2 (two) times daily.      potassium chloride SA 20 MEQ tablet   Commonly known as: K-DUR,KLOR-CON   Take 1 tablet (20 mEq total) by mouth daily. FOR 5 DAYS      traMADol 50 MG tablet   Commonly known as: ULTRAM   Take 1-2 tablets (50-100 mg total) by mouth every 4 (four) hours as needed.             Discharge Instructions:  The patient is to refrain from driving, heavy lifting or strenuous activity.  May shower daily and clean incisions with soap and water.  May resume regular diet.   Follow Up:  Discharge Orders    Future Appointments: Provider: Department: Dept Phone: Center:   09/24/2011 9:45 AM Jesus Genera Nurse [facility] [phone] [facility]   10/07/2011 4:30 PM Purcell Nails, MD [facility] [phone] [facility]      Follow-up Information    Follow up with Purcell Nails, MD on 10/07/2011. (Appointment is at 4:30pm)    Contact information:   [address] [place] [address] [phone]       Follow up with [facility] on 10/07/2011. (Please have chest xray performed at 3:30 )       Follow up with Charlton Haws, MD in 2 weeks. (Please contact office to make appointment.)    Contact information:   [address] [place] [address] [phone]       Follow up with [facility] on 09/24/2011. (Appointment is at 9:45 for suture removal)    Contact information:   [address], [address]          Jenene Kauffmann H 09/17/2011, 9:06 AM

## 2011-09-17 NOTE — Progress Notes (Addendum)
                    [address]            [place] [address]          [phone]     6 Days Post-Op Procedure(s) (LRB): AORTIC VALVE REPLACEMENT (AVR) (N/A) CORONARY ARTERY BYPASS GRAFTING (CABG) (N/A)  Subjective: Feels better today.  Finally had a BM.  Nausea resolved and eating better.  Breathing stable. Still weak.   Objective: Vital signs in last 24 hours: Patient Vitals for the past 24 hrs:  BP Temp Temp src Pulse Resp SpO2 Weight  09/17/11 0631 - - - - - - 179 lb 0.2 oz (81.2 kg)  09/17/11 0456 137/59 mmHg 98.2 F (36.8 C) Oral 74  18  92 % -  09/16/11 2135 139/56 mmHg 98.3 F (36.8 C) - 83  20  91 % -  09/16/11 1645 140/62 mmHg - - 79  20  93 % -  09/16/11 1345 110/56 mmHg 98.1 F (36.7 C) Oral 77  19  92 % -   Current Weight  09/17/11 179 lb 0.2 oz (81.2 kg)  Pre-op wt= 82.5 kg    Intake/Output from previous day: 08/05 0701 - 08/06 0700 In: 240 [P.O.:240] Out: -   CBGs 126-239-134  PHYSICAL EXAM:  Heart: RRR Lungs: decreased BS in bases Wound: clean and dry Extremities: +LE edema, ecchymosis right leg    Lab Results: CBC:No results found for this basename: WBC:2,HGB:2,HCT:2,PLT:2 in the last 72 hours BMET:  [facility] 09/15/11 0625  NA 132*  K 4.3  CL 94*  CO2 31  GLUCOSE 157*  BUN 25*  CREATININE 0.90  CALCIUM 9.4    PT/INR: No results found for this basename: LABPROT,INR in the last 72 hours    Assessment/Plan: S/P Procedure(s) (LRB): AORTIC VALVE REPLACEMENT (AVR) (N/A) CORONARY ARTERY BYPASS GRAFTING (CABG) (N/A)  CV- BPs improved, though still somewhat elevated.  Will increase beta blocker and watch.  Vol overload- Continue diuresis.  F/U CXR in am to evaluate effusions.  T1DM- sugars stabilizing.  Insulin pump functioning properly.  Continue to monitor.  Will d/c EPWs.  Possibly home in am if stable.   LOS: 6 days    COLLINS,GINA H 09/17/2011   I have seen and examined Jaclyn [name] and agree  with the above assessment  and plan.  Delight Ovens MD Beeper [phone] Office [phone] 09/17/2011 12:22 PM

## 2011-09-17 NOTE — Progress Notes (Addendum)
EPW d/c per MD order; slight oozing noted L EPW site; no bleeding; Chest tube sutures removed; steristrips applied; pt informed of 1 hr bedrest; bedrest until 0940; call bell w/i reach; will cont. To monitor.

## 2011-09-17 NOTE — Progress Notes (Signed)
CARDIAC REHAB PHASE I   PRE:  Rate/Rhythm: 79SR  BP:  Supine: 110/40  Sitting: 100/48  Standing: 98/50   SaO2: 87%RA, 95%2L  MODE:  Ambulation: 252 ft   POST:  Rate/Rhythem: 87SR  BP:  Supine:   Sitting: 112/48  Standing:    SaO2: 97%2L 4540-9811 Pt stated she felt very dizzy this am.  Did orthostatics as documented above. Pt had oxygen off when I entered room and sats at 87%. Put on 2L to get sats up. Pt willing to attempt ambulation even though she still felt a little dizzy. Pt walked 252 ft on 2L with rolling walker and asst x 1. She said dizziness got a little better with walk. Encouraged IS and more walks with staff later. Gave her a glass of water to drink. Left in recliner with call bell.  Duanne Limerick

## 2011-09-17 NOTE — Progress Notes (Addendum)
Pt ambulated 250 feet with NT and rolling walker; pt took one rest break during walk; pt assisted back to room to bed; call bell w/i reach; will cont. To monitor.

## 2011-09-18 ENCOUNTER — Inpatient Hospital Stay (HOSPITAL_COMMUNITY): Payer: Medicare Other

## 2011-09-18 LAB — GLUCOSE, CAPILLARY: Glucose-Capillary: 173 mg/dL — ABNORMAL HIGH (ref 70–99)

## 2011-09-18 NOTE — Progress Notes (Signed)
Discharge orders received.  Pt stable and education has been completed. Pt given follow up instructions and is to be discharged home with family.  Melik Blancett, [place] Marie, RN

## 2011-09-18 NOTE — Discharge Summary (Signed)
I agree with the above discharge summary and plan for follow-up.  Ilo Beamon H  

## 2011-09-18 NOTE — Progress Notes (Signed)
                    [address]            [place] [address]          [phone]     7 Days Post-Op Procedure(s) (LRB): AORTIC VALVE REPLACEMENT (AVR) (N/A) CORONARY ARTERY BYPASS GRAFTING (CABG) (N/A)  Subjective: Had some dizziness early in the day yesterday, but feels much better today.  Ready to go home.  Objective: Vital signs in last 24 hours: Patient Vitals for the past 24 hrs:  BP Temp Temp src Pulse Resp SpO2 Weight  09/18/11 0421 130/58 mmHg 97.3 F (36.3 C) Oral 65  18  96 % 179 lb (81.194 kg)  09/17/11 1945 123/61 mmHg 99.6 F (37.6 C) Oral 75  18  91 % -  09/17/11 1330 120/60 mmHg 97.4 F (36.3 C) Oral 60  18  90 % -  09/17/11 0943 131/57 mmHg - - 79  - - -  09/17/11 0930 118/54 mmHg - - 79  - - -  09/17/11 0915 137/55 mmHg - - 79  - - -  09/17/11 0900 135/53 mmHg - - 82  - - -  09/17/11 0849 126/54 mmHg - - 82  - 93 % -  09/17/11 0848 139/59 mmHg - - 84  - - -   Current Weight  09/18/11 179 lb (81.194 kg)  Pre-op wt= 82.5 kg    Intake/Output from previous day: 08/06 0701 - 08/07 0700 In: 600 [P.O.:600] Out: -   CBGs 128-205-182  PHYSICAL EXAM:  Heart: RRR Lungs: clear Wound: clean and dry Extremities: + LE edema, R>L, overall improved   Lab Results: CBC:No results found for this basename: WBC:2,HGB:2,HCT:2,PLT:2 in the last 72 hours BMET: No results found for this basename: NA:2,K:2,CL:2,CO2:2,GLUCOSE:2,BUN:2,CREATININE:2,CALCIUM:2 in the last 72 hours  PT/INR: No results found for this basename: LABPROT,INR in the last 72 hours  CXR: Findings: Bibasilar opacities persist, compatible with areas of  atelectasis and/or consolidation. Small right and moderate left  pleural effusions are similar. No evidence of pulmonary edema.  Heart size is mildly enlarged. The patient is rotated to the left  on today's exam, resulting in distortion of the mediastinal  contours and reduced diagnostic sensitivity and specificity for    mediastinal pathology. Atherosclerosis in the thoracic aorta.  Mild bilateral apical pleuroparenchymal thickening compatible with  scarring. Postradiation changes in the right suprahilar region with  chronic paramediastinal scarring and fibrosis, similar to priors.  Status post median sternotomy for CABG and aortic valve replacement  (there appears to be a bioprosthetic aortic valve in position).  IMPRESSION:  1. Allowing for slight difference in patient positioning, the  radiographic appearance of the chest is essentially unchanged, as  above.   Assessment/Plan: S/P Procedure(s) (LRB): AORTIC VALVE REPLACEMENT (AVR) (N/A) CORONARY ARTERY BYPASS GRAFTING (CABG) (N/A)  CV- BPs improved on increased doses of Cozaar and Lopressor.  SR.  T1DM- sugars generally stable on insulin pump.  L effusion- stable, pt asymptomatic and off O2.  Vol overload- continue diuresis.  Wt trending down and LE edema improving.  Plan home this am- instructions reviewed with patient.   LOS: 7 days    Maryah Marinaro H 09/18/2011

## 2011-09-18 NOTE — Progress Notes (Signed)
[id number] Education completed with pt. Permission given to refer to [place] Phase 2. Pt states she has rolling walker at home if she needs it. Tomeeka Plaugher DunlapRN

## 2011-09-20 ENCOUNTER — Telehealth: Payer: Self-pay | Admitting: *Deleted

## 2011-09-20 NOTE — Telephone Encounter (Signed)
Jaclyn [name] was discharged on Wednesday after CABG/AVR  by Dr. Cornelius Moras.  She calls with c/o trouble breathing, difficulty lying flat, right ankle swelling, feeling like there's a tightness in her chest.  She is taking the Tramadol to rest and make her calm, but it makes her "loopy".  She really isn't having any pain.  After further discussion, she was having all these issues when she was in the hospital and admits she thinks it's her nerves.  She says her PCP has tried her on nerve pills but she had issues with all of them.  I suggested she call him and see if he would work with her again since she had just had surgery.  She agreed  I instructed her regarding elevation of her head and leg when possible.

## 2011-09-21 ENCOUNTER — Observation Stay (HOSPITAL_COMMUNITY)
Admission: EM | Admit: 2011-09-21 | Discharge: 2011-09-22 | Disposition: A | Discharge status: Home / Self Care | Payer: Medicare Other | Attending: Internal Medicine | Admitting: Internal Medicine

## 2011-09-21 ENCOUNTER — Encounter (HOSPITAL_COMMUNITY): Payer: Self-pay | Admitting: *Deleted

## 2011-09-21 ENCOUNTER — Emergency Department (HOSPITAL_COMMUNITY): Payer: Medicare Other

## 2011-09-21 DIAGNOSIS — M810 Age-related osteoporosis without current pathological fracture: Secondary | ICD-10-CM | POA: Insufficient documentation

## 2011-09-21 DIAGNOSIS — Z794 Long term (current) use of insulin: Secondary | ICD-10-CM | POA: Insufficient documentation

## 2011-09-21 DIAGNOSIS — I509 Heart failure, unspecified: Secondary | ICD-10-CM | POA: Insufficient documentation

## 2011-09-21 DIAGNOSIS — Z79899 Other long term (current) drug therapy: Secondary | ICD-10-CM | POA: Insufficient documentation

## 2011-09-21 DIAGNOSIS — J701 Chronic and other pulmonary manifestations due to radiation: Secondary | ICD-10-CM | POA: Insufficient documentation

## 2011-09-21 DIAGNOSIS — F411 Generalized anxiety disorder: Secondary | ICD-10-CM | POA: Insufficient documentation

## 2011-09-21 DIAGNOSIS — E039 Hypothyroidism, unspecified: Secondary | ICD-10-CM | POA: Insufficient documentation

## 2011-09-21 DIAGNOSIS — K219 Gastro-esophageal reflux disease without esophagitis: Secondary | ICD-10-CM | POA: Insufficient documentation

## 2011-09-21 DIAGNOSIS — E785 Hyperlipidemia, unspecified: Secondary | ICD-10-CM | POA: Insufficient documentation

## 2011-09-21 DIAGNOSIS — J90 Pleural effusion, not elsewhere classified: Secondary | ICD-10-CM

## 2011-09-21 DIAGNOSIS — M7989 Other specified soft tissue disorders: Secondary | ICD-10-CM

## 2011-09-21 DIAGNOSIS — Y842 Radiological procedure and radiotherapy as the cause of abnormal reaction of the patient, or of later complication, without mention of misadventure at the time of the procedure: Secondary | ICD-10-CM | POA: Insufficient documentation

## 2011-09-21 DIAGNOSIS — I2510 Atherosclerotic heart disease of native coronary artery without angina pectoris: Secondary | ICD-10-CM | POA: Insufficient documentation

## 2011-09-21 DIAGNOSIS — Z923 Personal history of irradiation: Secondary | ICD-10-CM | POA: Insufficient documentation

## 2011-09-21 DIAGNOSIS — C3490 Malignant neoplasm of unspecified part of unspecified bronchus or lung: Secondary | ICD-10-CM | POA: Insufficient documentation

## 2011-09-21 DIAGNOSIS — I10 Essential (primary) hypertension: Secondary | ICD-10-CM | POA: Insufficient documentation

## 2011-09-21 DIAGNOSIS — M79609 Pain in unspecified limb: Secondary | ICD-10-CM

## 2011-09-21 DIAGNOSIS — I359 Nonrheumatic aortic valve disorder, unspecified: Secondary | ICD-10-CM | POA: Insufficient documentation

## 2011-09-21 DIAGNOSIS — E119 Type 2 diabetes mellitus without complications: Secondary | ICD-10-CM | POA: Insufficient documentation

## 2011-09-21 DIAGNOSIS — I5033 Acute on chronic diastolic (congestive) heart failure: Principal | ICD-10-CM | POA: Insufficient documentation

## 2011-09-21 DIAGNOSIS — R0602 Shortness of breath: Secondary | ICD-10-CM

## 2011-09-21 HISTORY — DX: Anxiety disorder, unspecified: F41.9

## 2011-09-21 HISTORY — DX: Unspecified diastolic (congestive) heart failure: I50.30

## 2011-09-21 LAB — BASIC METABOLIC PANEL
BUN: 20 mg/dL (ref 6–23)
CO2: 32 mEq/L (ref 19–32)
Calcium: 10.3 mg/dL (ref 8.4–10.5)
Chloride: 92 mEq/L — ABNORMAL LOW (ref 96–112)
Creatinine, Ser: 1.02 mg/dL (ref 0.50–1.10)
GFR calc Af Amer: 64 mL/min — ABNORMAL LOW (ref >90)
GFR calc non Af Amer: 56 mL/min — ABNORMAL LOW (ref >90)
Glucose, Bld: 91 mg/dL (ref 70–99)
Potassium: 4.1 mEq/L (ref 3.5–5.1)
Sodium: 133 mEq/L — ABNORMAL LOW (ref 135–145)

## 2011-09-21 LAB — CBC
MCH: 27.8 pg (ref 26.0–34.0)
Platelets: 670 10*3/uL — ABNORMAL HIGH (ref 150–400)
RBC: 3.35 MIL/uL — ABNORMAL LOW (ref 3.87–5.11)
RDW: 15.1 % (ref 11.5–15.5)
WBC: 14 10*3/uL — ABNORMAL HIGH (ref 4.0–10.5)

## 2011-09-21 LAB — PRO B NATRIURETIC PEPTIDE: Pro B Natriuretic peptide (BNP): 4252 pg/mL — ABNORMAL HIGH (ref 0–125)

## 2011-09-21 LAB — URINALYSIS, ROUTINE W REFLEX MICROSCOPIC
Glucose, UA: NEGATIVE mg/dL
Hgb urine dipstick: NEGATIVE
Ketones, ur: NEGATIVE mg/dL
Protein, ur: NEGATIVE mg/dL

## 2011-09-21 LAB — GLUCOSE, CAPILLARY: Glucose-Capillary: 288 mg/dL — ABNORMAL HIGH (ref 70–99)

## 2011-09-21 LAB — POCT I-STAT TROPONIN I

## 2011-09-21 MED ORDER — INSULIN PUMP
Freq: Three times a day (TID) | SUBCUTANEOUS | Status: DC
  Administered 2011-09-21 (×2): via SUBCUTANEOUS
  Administered 2011-09-22: 1.6 via SUBCUTANEOUS
  Filled 2011-09-21: qty 1

## 2011-09-21 MED ORDER — METOPROLOL TARTRATE 100 MG PO TABS
100.0000 mg | ORAL_TABLET | Freq: Two times a day (BID) | ORAL | Status: DC
  Administered 2011-09-21 – 2011-09-22 (×2): 100 mg via ORAL
  Filled 2011-09-21 (×3): qty 1

## 2011-09-21 MED ORDER — SODIUM CHLORIDE 0.9 % IJ SOLN: 3.0000 mL | INTRAMUSCULAR | Status: DC | PRN

## 2011-09-21 MED ORDER — POTASSIUM CHLORIDE CRYS ER 20 MEQ PO TBCR
20.0000 meq | EXTENDED_RELEASE_TABLET | Freq: Every day | ORAL | Status: DC
  Administered 2011-09-21 – 2011-09-22 (×2): 20 meq via ORAL
  Filled 2011-09-21 (×2): qty 1

## 2011-09-21 MED ORDER — LOSARTAN POTASSIUM 50 MG PO TABS
100.0000 mg | ORAL_TABLET | Freq: Every day | ORAL | Status: DC
  Administered 2011-09-22: 100 mg via ORAL
  Filled 2011-09-21 (×2): qty 2

## 2011-09-21 MED ORDER — LEVOTHYROXINE SODIUM 112 MCG PO TABS
112.0000 ug | ORAL_TABLET | Freq: Every day | ORAL | Status: DC
  Administered 2011-09-22: 112 ug via ORAL
  Filled 2011-09-21 (×2): qty 1

## 2011-09-21 MED ORDER — VITAMIN D3 25 MCG (1000 UNIT) PO TABS
1000.0000 [IU] | ORAL_TABLET | Freq: Every day | ORAL | Status: DC
  Administered 2011-09-21 – 2011-09-22 (×2): 1000 [IU] via ORAL
  Filled 2011-09-21 (×2): qty 1

## 2011-09-21 MED ORDER — ASPIRIN EC 81 MG PO TBEC: 81.0000 mg | DELAYED_RELEASE_TABLET | Freq: Every day | ORAL | Status: DC

## 2011-09-21 MED ORDER — SODIUM CHLORIDE 0.9 % IJ SOLN
3.0000 mL | Freq: Two times a day (BID) | INTRAMUSCULAR | Status: DC
  Administered 2011-09-21: 3 mL via INTRAVENOUS

## 2011-09-21 MED ORDER — DIAZEPAM 5 MG PO TABS: 5.0000 mg | ORAL_TABLET | Freq: Three times a day (TID) | ORAL | Status: DC | PRN

## 2011-09-21 MED ORDER — SODIUM CHLORIDE 0.9 % IV SOLN: 250.0000 mL | INTRAVENOUS | Status: DC | PRN

## 2011-09-21 MED ORDER — ASPIRIN EC 325 MG PO TBEC
325.0000 mg | DELAYED_RELEASE_TABLET | Freq: Every day | ORAL | Status: DC
Start: 2011-09-21 — End: 2011-09-22
  Administered 2011-09-22: 325 mg via ORAL
  Filled 2011-09-21: qty 1

## 2011-09-21 MED ORDER — ACETAMINOPHEN 500 MG PO TABS
500.0000 mg | ORAL_TABLET | Freq: Four times a day (QID) | ORAL | Status: DC | PRN
  Filled 2011-09-21: qty 1

## 2011-09-21 MED ORDER — FUROSEMIDE 10 MG/ML IJ SOLN
40.0000 mg | Freq: Two times a day (BID) | INTRAMUSCULAR | Status: DC
  Administered 2011-09-21 – 2011-09-22 (×2): 40 mg via INTRAVENOUS
  Filled 2011-09-21 (×4): qty 4

## 2011-09-21 MED ORDER — FUROSEMIDE 20 MG PO TABS: 40.0000 mg | ORAL_TABLET | Freq: Every day | ORAL | Status: DC

## 2011-09-21 MED ORDER — TRAMADOL HCL 50 MG PO TABS
50.0000 mg | ORAL_TABLET | ORAL | Status: DC | PRN
  Filled 2011-09-21: qty 2

## 2011-09-21 NOTE — ED Notes (Signed)
Pt's CBG was 139 when I checked it. 3:02 pm JG

## 2011-09-21 NOTE — ED Notes (Signed)
Pt expresses that she has been having a lot of anxiety since she has been home from the surgery. sts there is a lot of family in the home and her 67 year old mother. sts when she lies in the bed at night that she gets anxious and her heart feels like it is beating fast. MD aware. Pt also expresses concern with doppler of leg due to pain. MD aware and will order meds

## 2011-09-21 NOTE — ED Notes (Signed)
US at bedside

## 2011-09-21 NOTE — ED Notes (Signed)
Pt given OJ for low CBG per MD

## 2011-09-21 NOTE — H&P (Signed)
Jaclyn [name] is an 66 y.o. female.   Chief Complaint: " I can't breath" HPI: The patient is a very pleasant 66 yo woman with recent AVR/CABG after presenting with CHF and found to have severe CAD and severe AS. Post op she was stable but did have a large pleural effusion. She has been unable to sleep at night secondary to dyspnea and has had chest tightness but no anginal pain. Tightness associated with a deep breath. She notes tha walking might make things better. She has had her daughter, son-in-law and grandchildren move into her house. No syncope or palpitations.  Past Medical History  Diagnosis Date  . Palpitation   . Allergic rhinitis   . Dermatitis   . Fibrocystic disease of breast   . Callus   . Reflux esophagitis   . Corns   . Depression   . Dizziness   . Fatigue   . Foot pain   . Murmur   . Gastroparesis   . Hematuria   . Hyperlipidemia   . Hypothyroidism   . Joint pain   . Menopause   . Microalbuminuria   . Myalgia   . Osteoporosis   . Plantar fasciitis   . Keratosis   . SOB (shortness of breath)   . Vitamin d deficiency   . Chest pain   . Orthostatic hypotension   . Radiation fibrosis of lung   . 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 Aortic stenosis, severe 08/22/2011  . Coronary artery disease 08/21/2011    Cath 08/21/2011  . S/P radiation therapy 08/22/2011    Radiation therapy to chest and mediastinum for small cell carcinoma of the lung  . Family history of anesthesia complication     family history of N&V  . Anemia     blood transfusion- 6 yrs. ago by Dr. Arline Asp   . HTN (hypertension)     Dr. Eden Emms at [facility] takes care of cardiac needs, last stress test- 06/2011  . GERD (gastroesophageal reflux disease)   . Seizures     32 yrs. ago, was on phenobarbital for sometime, off it for 20 yrs.   . Peripheral neuropathy     neuropathy   . Lung cancer 06/12/1997   Limited stage Small Cell Carcinoma of the lung treated with carboplatin and VP16 + external beam radiation therapy to mediastinum and chest (1999)   . DM (diabetes mellitus)     type I - treated with insulin pump /w x48yrs  . Arthritis     knees  . S/P aortic valve replacement with bioprosthetic valve 09/11/2011    21 mm [facility] Ease pericardial tissue valve  . S/P CABG x 3 09/11/2011    Free LIMA to LAD, SVG to D1, SVG to RPL, EVH via right thigh and leg    Past Surgical History  Procedure Date  . Thyroidectomy     partial- R   . Total abdominal hysterectomy   . Shoulder surgery     bilateral- RCR-   . Elbow surgery     bilateral  . Carpal tunnel release     bilateral  . Cardiac catheterization     08/21/2011  . Multiple extractions with alveoloplasty 08/29/2011    Procedure: MULTIPLE EXTRACION WITH ALVEOLOPLASTY;  Surgeon: Charlynne Pander, DDS;  Location: [facility] OR;  Service: Oral Surgery;  Laterality: N/A;  extraction of teeth #  22, 27 with alveoloplasty  . Aortic valve replacement 09/11/2011    Procedure: AORTIC VALVE REPLACEMENT (AVR);  Surgeon: Purcell Nails, MD;  Location: [facility] OR;  Service: Open Heart Surgery;  Laterality: N/A;  . Coronary artery bypass graft 09/11/2011    Procedure: CORONARY ARTERY BYPASS GRAFTING (CABG);  Surgeon: Purcell Nails, MD;  Location: [facility] OR;  Service: Open Heart Surgery;  Laterality: N/A;  Times Three using left internal mammary artery and right greater saphenous vein harvested endoscopically: intraoperative TEE.    Family History  Problem Relation Age of Onset  . Alzheimer's disease    . Anemia    . Cancer    . Cirrhosis    . Colon cancer    . Coronary artery disease    . Heart attack    . Heart disease    . Heart failure Mother   . Hypertension Mother   . Thyroid disease Mother   . Cancer Father    Social History:  reports that she quit smoking about 14 years ago. Her smoking use included Cigarettes. She has a 86 pack-year smoking  history. She quit smokeless tobacco use about 14 years ago. She reports that she does not drink alcohol or use illicit drugs.  Allergies:  Allergies  Allergen Reactions  . Statins     Joints ache...crestor...lipitor...crestor     (Not in a hospital admission)  Results for orders placed during the hospital encounter of 09/21/11 (from the past 48 hour(s))  CBC     Status: Abnormal   Collection Time   09/21/11 12:18 PM      Component Value Range Comment   WBC 14.0 (*) 4.0 - 10.5 K/uL    RBC 3.35 (*) 3.87 - 5.11 MIL/uL    Hemoglobin 9.3 (*) 12.0 - 15.0 g/dL    HCT 98.1 (*) 19.1 - 46.0 %    MCV 85.1  78.0 - 100.0 fL    MCH 27.8  26.0 - 34.0 pg    MCHC 32.6  30.0 - 36.0 g/dL    RDW 47.8  29.5 - 62.1 %    Platelets 670 (*) 150 - 400 K/uL   BASIC METABOLIC PANEL     Status: Abnormal   Collection Time   09/21/11 12:18 PM      Component Value Range Comment   Sodium 133 (*) 135 - 145 mEq/L    Potassium 4.1  3.5 - 5.1 mEq/L    Chloride 92 (*) 96 - 112 mEq/L    CO2 32  19 - 32 mEq/L    Glucose, Bld 91  70 - 99 mg/dL    BUN 20  6 - 23 mg/dL    Creatinine, Ser 3.08  0.50 - 1.10 mg/dL    Calcium 65.7  8.4 - 10.5 mg/dL    GFR calc non Af Amer 56 (*) >90 mL/min    GFR calc Af Amer 64 (*) >90 mL/min   PROTIME-INR     Status: Normal   Collection Time   09/21/11 12:18 PM      Component Value Range Comment   Prothrombin Time 15.0  11.6 - 15.2 seconds    INR 1.16  0.00 - 1.49   URINALYSIS, ROUTINE W REFLEX MICROSCOPIC     Status: Abnormal   Collection Time   09/21/11 12:29 PM      Component Value Range Comment   Color, Urine YELLOW  YELLOW    APPearance CLEAR  CLEAR    Specific Gravity, Urine  1.008  1.005 - 1.030    pH 7.0  5.0 - 8.0    Glucose, UA NEGATIVE  NEGATIVE mg/dL    Hgb urine dipstick NEGATIVE  NEGATIVE    Bilirubin Urine NEGATIVE  NEGATIVE    Ketones, ur NEGATIVE  NEGATIVE mg/dL    Protein, ur NEGATIVE  NEGATIVE mg/dL    Urobilinogen, UA 0.2  0.0 - 1.0 mg/dL    Nitrite  NEGATIVE  NEGATIVE    Leukocytes, UA MODERATE (*) NEGATIVE   URINE MICROSCOPIC-ADD ON     Status: Abnormal   Collection Time   09/21/11 12:29 PM      Component Value Range Comment   Squamous Epithelial / LPF FEW (*) RARE    WBC, UA 3-6  <3 WBC/hpf   POCT I-STAT TROPONIN I     Status: Normal   Collection Time   09/21/11  1:15 PM      Component Value Range Comment   Troponin i, poc 0.07  0.00 - 0.08 ng/mL    Comment 3            PRO B NATRIURETIC PEPTIDE     Status: Abnormal   Collection Time   09/21/11  1:48 PM      Component Value Range Comment   Pro B Natriuretic peptide (BNP) 4252.0 (*) 0 - 125 pg/mL   GLUCOSE, CAPILLARY     Status: Abnormal   Collection Time   09/21/11  1:49 PM      Component Value Range Comment   Glucose-Capillary 65 (*) 70 - 99 mg/dL    Comment 1 Documented in Chart      Comment 2 Notify RN     GLUCOSE, CAPILLARY     Status: Abnormal   Collection Time   09/21/11  2:56 PM      Component Value Range Comment   Glucose-Capillary 139 (*) 70 - 99 mg/dL    Comment 1 Notify RN      Dg Chest 2 View  09/21/2011  *RADIOLOGY REPORT*  Clinical Data: Short of breath, weakness, nausea, recent CABG  CHEST - 2 VIEW  Comparison: 09/18/2011; 09/16/2011; 09/14/2011; chest CT - 08/27/2011  Findings: Grossly unchanged cardiac silhouette and mediastinal contours post median sternotomy, CABG and aortic valve repair. Stable radiation change about the right hilum with associated medial right apical pleuroparenchymal thickening.  Bilateral small pleural effusions grossly unchanged, left mid than right.  Grossly unchanged bibasilar heterogeneous opacities.  The lungs remain hyperinflated with mild diffuse thickening of the pulmonary interstitium.  No pneumothorax.  Grossly unchanged bones including accentuated thoracic kyphosis.  Vascular calcifications within the upper abdomen.  IMPRESSION: 1.  Grossly unchanged small bilateral pleural effusions and bibasilar opacities, left greater than  right, atelectasis versus infiltrate. 2.  Stable emphysematous and radiation change of the chest.  Original Report Authenticated By: Waynard Reeds, M.D.    ROS - all systems reviewed and negative except as noted in the HPI  Physical Exam Well appearing 66 yo woman, NAD HEENT: Unremarkable Neck:  7 cm JVD, no thyromegally Lungs:  Clear except for rales 1/3 up bilaterally. No wheezes or increased work of breathing. HEART:  Regular rate rhythm, no murmurs, no rubs, no clicks Abd:  soft, positive bowel sounds, no organomegally, no rebound, no guarding Ext:  2 plus pulses, trace edema, no cyanosis, no clubbing Skin:  No rashes no nodules Neuro:  CN II through XII intact, motor grossly intact   Blood pressure 149/56, pulse 60, temperature  97.8 F (36.6 C), temperature source Oral, resp. rate 19, SpO2 98.00%. EKG: Nsr with Pvc's  CXR - reviewed, pleural effusions remain.  Assessment/Plan 1. Persistent dyspnea after AVR/CABG 2. Bilateral pleural effusions 3. COPD 4. H/o lung CA, s/ treatment 14 years ago 5. Anxiety Rec: I suspect her dyspnea is multifactorial. Will admit and give IV lasix. She may require therapeutic thoracentesis. Will use albuterol nebs as well. A brief course of anti-anxiety meds might be helpful at discharge.  Lewayne Bunting 09/21/2011, 3:33 PM

## 2011-09-21 NOTE — ED Notes (Signed)
Pt recently discharged after CABG and now having increased SOB and nausea with "trouble focusing"; per family pt with large fluctuations in BP from new meds

## 2011-09-21 NOTE — Progress Notes (Signed)
VASCULAR LAB PRELIMINARY  PRELIMINARY  PRELIMINARY  PRELIMINARY  Right lower extremity venous Dopplers completed.    Preliminary report:  There is no DVT noted in the right lower extremity.  There is superficial thrombus noted at the site of greater saphenous harvest.  Travers Goodley, 09/21/2011, 3:21 PM

## 2011-09-21 NOTE — ED Provider Notes (Signed)
History     CSN: [id number]  Arrival date & time 09/21/11  1151   First MD Initiated Contact with Patient 09/21/11 1239      Chief Complaint  Patient presents with  . Shortness of Breath  . Nausea    (Consider location/radiation/quality/duration/timing/severity/associated sxs/prior treatment) HPI Comments: Pt with hx of recent AV replacement and CABG pod #10 comes in with cc of SOB. Pt was discharge on Wednesday from the hospital. Pt states that since her discharge, she has been having worsening SOB. Pt has baseline SOB, which is what led to the dx of her cardiac dysfunction - but since discharge, she has gotten SOB even at rest. Pt mostly has her sx at night. She has been sleeping upright, and has dib when she lays flat. She is on lasix, and she has been taking the meds as prescribed. Pt has no chest pain, but she does have a non productive cough without any fevers. Pt has no n/v/chills. There is no hx of PE. Pt does indicate increased swelling in her RLE - the site which was used for CABG. Pt called her CT surgeon, and was advices to come to the ED for further evaluation.  Patient is a 66 y.o. female presenting with shortness of breath. The history is provided by the patient and medical records.  Shortness of Breath  Associated symptoms include cough and shortness of breath. Pertinent negatives include no chest pain and no wheezing.    Past Medical History  Diagnosis Date  . Palpitation   . Allergic rhinitis   . Dermatitis   . Fibrocystic disease of breast   . Callus   . Reflux esophagitis   . Corns   . Depression   . Dizziness   . Fatigue   . Foot pain   . Murmur   . Gastroparesis   . Hematuria   . Hyperlipidemia   . Hypothyroidism   . Joint pain   . Menopause   . Microalbuminuria   . Myalgia   . Osteoporosis   . Plantar fasciitis   . Keratosis   . SOB (shortness of breath)   . Vitamin d deficiency   . Chest pain   . Orthostatic hypotension   . Radiation  fibrosis of lung   . 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 Aortic stenosis, severe 08/22/2011  . Coronary artery disease 08/21/2011    Cath 08/21/2011  . S/P radiation therapy 08/22/2011    Radiation therapy to chest and mediastinum for small cell carcinoma of the lung  . Family history of anesthesia complication     family history of N&V  . Anemia     blood transfusion- 6 yrs. ago by Dr. Arline Asp   . HTN (hypertension)     Dr. Eden Emms at [facility] takes care of cardiac needs, last stress test- 06/2011  . GERD (gastroesophageal reflux disease)   . Seizures     32 yrs. ago, was on phenobarbital for sometime, off it for 20 yrs.   . Peripheral neuropathy     neuropathy   . 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 DM (diabetes mellitus)     type I - treated with insulin pump /w x72yrs  . Arthritis  knees  . S/P aortic valve replacement with bioprosthetic valve 09/11/2011    21 mm [facility] Ease pericardial tissue valve  . S/P CABG x 3 09/11/2011    Free LIMA to LAD, SVG to D1, SVG to RPL, EVH via right thigh and leg    Past Surgical History  Procedure Date  . Thyroidectomy     partial- R   . Total abdominal hysterectomy   . Shoulder surgery     bilateral- RCR-   . Elbow surgery     bilateral  . Carpal tunnel release     bilateral  . Cardiac catheterization     08/21/2011  . Multiple extractions with alveoloplasty 08/29/2011    Procedure: MULTIPLE EXTRACION WITH ALVEOLOPLASTY;  Surgeon: Charlynne Pander, DDS;  Location: [facility] OR;  Service: Oral Surgery;  Laterality: N/A;  extraction of teeth # 22, 27 with alveoloplasty  . Aortic valve replacement 09/11/2011    Procedure: AORTIC VALVE REPLACEMENT (AVR);  Surgeon: Purcell Nails, MD;  Location: [facility] OR;  Service: Open  Heart Surgery;  Laterality: N/A;  . Coronary artery bypass graft 09/11/2011    Procedure: CORONARY ARTERY BYPASS GRAFTING (CABG);  Surgeon: Purcell Nails, MD;  Location: [facility] OR;  Service: Open Heart Surgery;  Laterality: N/A;  Times Three using left internal mammary artery and right greater saphenous vein harvested endoscopically: intraoperative TEE.    Family History  Problem Relation Age of Onset  . Alzheimer's disease    . Anemia    . Cancer    . Cirrhosis    . Colon cancer    . Coronary artery disease    . Heart attack    . Heart disease    . Heart failure Mother   . Hypertension Mother   . Thyroid disease Mother   . Cancer Father     History  Substance Use Topics  . Smoking status: Former Smoker -- 2.0 packs/day for 43 years    Types: Cigarettes    Quit date: 08/27/1997  . Smokeless tobacco: Former Neurosurgeon    Quit date: 08/27/1997  . Alcohol Use: No    OB History    Grav Para Term Preterm Abortions TAB SAB Ect Mult Living                  Review of Systems  HENT: Negative for facial swelling and neck pain.   Respiratory: Positive for cough and shortness of breath. Negative for choking and wheezing.   Cardiovascular: Negative for chest pain.  Gastrointestinal: Negative for nausea, vomiting, abdominal pain, diarrhea, constipation, blood in stool and abdominal distention.  Genitourinary: Negative for dysuria, hematuria and difficulty urinating.  Skin: Positive for rash. Negative for color change.  Neurological: Negative for speech difficulty and headaches.  Hematological: Does not bruise/bleed easily.  Psychiatric/Behavioral: Negative for confusion.    Allergies  Statins  Home Medications   Current Outpatient Rx  Name Route Sig Dispense Refill  . ACETAMINOPHEN 500 MG PO TABS Oral Take 500 mg by mouth every 6 (six) hours as needed. For pain    . ASPIRIN 325 MG PO TBEC Oral Take 325 mg by mouth daily.    Marland Kitchen VITAMIN D 1000 UNITS PO TABS Oral Take 1,000 Units by  mouth daily.    . FUROSEMIDE 40 MG PO TABS Oral Take 1 tablet (40 mg total) by mouth daily. For 5 days 5 tablet 0  . INSULIN PUMP Subcutaneous Inject into the skin. humalog- basal rate continuously &  boluses as appropriate to her carbs & blood glucose    . LEVOTHYROXINE SODIUM 112 MCG PO TABS Oral Take 112 mcg by mouth daily before breakfast.     . LOSARTAN POTASSIUM 100 MG PO TABS Oral Take 1 tablet (100 mg total) by mouth daily before breakfast. 30 tablet 1  . MAGNESIUM HYDROXIDE 400 MG/5ML PO SUSP Oral Take 30 mLs by mouth daily as needed. For constipation    . METOPROLOL TARTRATE 100 MG PO TABS Oral Take 1 tablet (100 mg total) by mouth 2 (two) times daily. 60 tablet 1  . POTASSIUM CHLORIDE CRYS ER 20 MEQ PO TBCR Oral Take 1 tablet (20 mEq total) by mouth daily. FOR 5 DAYS 5 tablet 0  . EX-LAX PO Oral Take 2 tablets by mouth daily as needed. For constipation    . TRAMADOL HCL 50 MG PO TABS Oral Take 1-2 tablets (50-100 mg total) by mouth every 4 (four) hours as needed. 30 tablet 0    BP 149/56  Pulse 60  Temp 97.8 F (36.6 C) (Oral)  Resp 19  SpO2 98%  Physical Exam  Constitutional: She is oriented to person, place, and time. She appears well-developed and well-nourished.  HENT:  Head: Normocephalic and atraumatic.  Eyes: Conjunctivae and EOM are normal. Pupils are equal, round, and reactive to light.  Neck: Normal range of motion. Neck supple. JVD present.  Cardiovascular: Normal rate and regular rhythm.   Murmur heard. Pulmonary/Chest: Effort normal and breath sounds normal. No respiratory distress.  Abdominal: Soft. Bowel sounds are normal. She exhibits no distension. There is no tenderness. There is no rebound and no guarding.  Musculoskeletal: She exhibits edema.       LLE swelling with calf tenderness. Diffuse ecchymoses.  Neurological: She is alert and oriented to person, place, and time.  Skin: Skin is warm and dry.    ED Course  Procedures (including critical care  time)  Labs Reviewed  CBC - Abnormal; Notable for the following:    WBC 14.0 (*)     RBC 3.35 (*)     Hemoglobin 9.3 (*)     HCT 28.5 (*)     Platelets 670 (*)     All other components within normal limits  BASIC METABOLIC PANEL - Abnormal; Notable for the following:    Sodium 133 (*)     Chloride 92 (*)     GFR calc non Af Amer 56 (*)     GFR calc Af Amer 64 (*)     All other components within normal limits  URINALYSIS, ROUTINE W REFLEX MICROSCOPIC - Abnormal; Notable for the following:    Leukocytes, UA MODERATE (*)     All other components within normal limits  URINE MICROSCOPIC-ADD ON - Abnormal; Notable for the following:    Squamous Epithelial / LPF FEW (*)     All other components within normal limits  PRO B NATRIURETIC PEPTIDE - Abnormal; Notable for the following:    Pro B Natriuretic peptide (BNP) 4252.0 (*)     All other components within normal limits  GLUCOSE, CAPILLARY - Abnormal; Notable for the following:    Glucose-Capillary 65 (*)     All other components within normal limits  GLUCOSE, CAPILLARY - Abnormal; Notable for the following:    Glucose-Capillary 139 (*)     All other components within normal limits  PROTIME-INR  POCT I-STAT TROPONIN I   Dg Chest 2 View  09/21/2011  *RADIOLOGY REPORT*  Clinical Data:  Short of breath, weakness, nausea, recent CABG  CHEST - 2 VIEW  Comparison: 09/18/2011; 09/16/2011; 09/14/2011; chest CT - 08/27/2011  Findings: Grossly unchanged cardiac silhouette and mediastinal contours post median sternotomy, CABG and aortic valve repair. Stable radiation change about the right hilum with associated medial right apical pleuroparenchymal thickening.  Bilateral small pleural effusions grossly unchanged, left mid than right.  Grossly unchanged bibasilar heterogeneous opacities.  The lungs remain hyperinflated with mild diffuse thickening of the pulmonary interstitium.  No pneumothorax.  Grossly unchanged bones including accentuated thoracic  kyphosis.  Vascular calcifications within the upper abdomen.  IMPRESSION: 1.  Grossly unchanged small bilateral pleural effusions and bibasilar opacities, left greater than right, atelectasis versus infiltrate. 2.  Stable emphysematous and radiation change of the chest.  Original Report Authenticated By: Waynard Reeds, M.D.     No diagnosis found.    MDM   Date: 09/21/2011  Rate:59  Rhythm: normal sinus rhythm  QRS Axis: normal  Intervals: normal  ST/T Wave abnormalities: nonspecific ST/T changes  Conduction Disutrbances:none  Narrative Interpretation:   Old EKG Reviewed: unchanged PVCs  Differential diagnosis includes: ACS syndrome CHF exacerbation Valvular disorder Myocarditis Pericarditis Pericardial effusion Pneumonia Pleural effusion Pulmonary edema PE Anemia Musculoskeletal pain  A/P: Pt with hx of CAD and valvular disorder comes in with cc of SOB. Pt had a recent CABG, and thus ACS is extremely low on the ddx. Pt has a non productive cough, and sx consistent with orthopnea and PND, with worsening leg swelling. Pt also admits to having some anxiety problems. Clinically though exam reveals a JVD, poor air entry in the left lower lung fields and swollen RLE with calf tenderness. We therefore think there might be some CHF component. DDx is still + for Pneumonia, and PE - the pretest probability of the latter is low, and if her DVT scan is negative, we dont think PE needs to be looked into any further given no tachycardia, no hypoxia and no pleuritic chest pain. We will call CT surgery - given she is a recent d/c from their service and discuss the case with the Cardiologist.  3:43 PM Labs show BNP elevation, leukocytosis with neutrophilia and thrombocytosis. Cant r/o CHF, and we have a little heightened suspicion of may be Pneumonia, but those results are very non specific. CXR shows no infiltrates, patient is afebrile - so it would be over aggressive to dx patient with  pneumonia and start HCAP tx.  Spoke with CT surgery - No recommendations from them at this point. Consulted Cardiology - and patient will be evaluated by their service. [place] - no DVT.  Dispo: Await Cardiology recs. Pain control.          Derwood Kaplan, MD 09/21/11 1549

## 2011-09-21 NOTE — ED Notes (Signed)
Cardiology at bedside.

## 2011-09-22 ENCOUNTER — Encounter (HOSPITAL_COMMUNITY): Payer: Self-pay | Admitting: Cardiology

## 2011-09-22 LAB — BASIC METABOLIC PANEL
CO2: 34 mEq/L — ABNORMAL HIGH (ref 19–32)
Chloride: 89 mEq/L — ABNORMAL LOW (ref 96–112)
Creatinine, Ser: 1.04 mg/dL (ref 0.50–1.10)
Potassium: 4 mEq/L (ref 3.5–5.1)

## 2011-09-22 LAB — GLUCOSE, CAPILLARY
Glucose-Capillary: 270 mg/dL — ABNORMAL HIGH (ref 70–99)
Glucose-Capillary: 167 mg/dL — ABNORMAL HIGH (ref 70–99)

## 2011-09-22 LAB — CBC
HCT: 28.3 % — ABNORMAL LOW (ref 36.0–46.0)
Hemoglobin: 9.2 g/dL — ABNORMAL LOW (ref 12.0–15.0)
MCV: 84.2 fL (ref 78.0–100.0)
RDW: 15.2 % (ref 11.5–15.5)
WBC: 13.1 10*3/uL — ABNORMAL HIGH (ref 4.0–10.5)

## 2011-09-22 MED ORDER — FUROSEMIDE 40 MG PO TABS: 40.0000 mg | ORAL_TABLET | Freq: Two times a day (BID) | ORAL | Status: DC

## 2011-09-22 MED ORDER — DIAZEPAM 5 MG PO TABS: 5.0000 mg | ORAL_TABLET | Freq: Every day | ORAL | Status: AC

## 2011-09-22 MED ORDER — POTASSIUM CHLORIDE CRYS ER 20 MEQ PO TBCR: 20.0000 meq | EXTENDED_RELEASE_TABLET | Freq: Every day | ORAL | Status: DC

## 2011-09-22 NOTE — Progress Notes (Addendum)
Patient ID: Jaclyn [name], female   DOB: [birth year], 66 y.o.   MRN: [id number] Subjective:  Dyspnea improved. Diuresed 4 lbs since yesterday.  Objective:  Vital Signs in the last 24 hours: Temp:  [97.8 F (36.6 C)-98.4 F (36.9 C)] 98 F (36.7 C) (08/11 0530) Pulse Rate:  [57-76] 66  (08/11 0530) Resp:  [16-23] 18  (08/11 0530) BP: (114-167)/(44-71) 151/50 mmHg (08/11 0530) SpO2:  [95 %-100 %] 99 % (08/11 0530) Weight:  [174 lb 2.6 oz (79 kg)-178 lb 12.7 oz (81.1 kg)] 174 lb 2.6 oz (79 kg) (08/11 0530)  Intake/Output from previous day: 08/10 0701 - 08/11 0700 In: 120 [P.O.:120] Out: 2050 [Urine:2050] Intake/Output from this shift: Total I/O In: -  Out: 125 [Urine:125]  Physical Exam: Well appearing NAD HEENT: Unremarkable Neck:  No JVD, no thyromegally Lungs:  Clear with minimal basilar rales. No wheezes HEART:  Regular rate rhythm, no murmurs, no rubs, no clicks Abd:  Flat, positive bowel sounds, no organomegally, no rebound, no guarding Ext:  2 plus pulses, no edema, no cyanosis, no clubbing Skin:  No rashes no nodules Neuro:  CN II through XII intact, motor grossly intact  Lab Results:  Basename 09/22/11 0635 09/21/11 1218  WBC 13.1* 14.0*  HGB 9.2* 9.3*  PLT 606* 670*    Basename 09/22/11 0635 09/21/11 1218  NA 131* 133*  K 4.0 4.1  CL 89* 92*  CO2 34* 32  GLUCOSE 164* 91  BUN 17 20  CREATININE 1.04 1.02   No results found for this basename: TROPONINI:2,CK,MB:2 in the last 72 hours Hepatic Function Panel No results found for this basename: PROT,ALBUMIN,AST,ALT,ALKPHOS,BILITOT,BILIDIR,IBILI in the last 72 hours No results found for this basename: CHOL in the last 72 hours No results found for this basename: PROTIME in the last 72 hours  Imaging: Dg Chest 2 View  09/21/2011  *RADIOLOGY REPORT*  Clinical Data: Short of breath, weakness, nausea, recent CABG  CHEST - 2 VIEW  Comparison: 09/18/2011; 09/16/2011; 09/14/2011; chest CT - 08/27/2011   Findings: Grossly unchanged cardiac silhouette and mediastinal contours post median sternotomy, CABG and aortic valve repair. Stable radiation change about the right hilum with associated medial right apical pleuroparenchymal thickening.  Bilateral small pleural effusions grossly unchanged, left mid than right.  Grossly unchanged bibasilar heterogeneous opacities.  The lungs remain hyperinflated with mild diffuse thickening of the pulmonary interstitium.  No pneumothorax.  Grossly unchanged bones including accentuated thoracic kyphosis.  Vascular calcifications within the upper abdomen.  IMPRESSION: 1.  Grossly unchanged small bilateral pleural effusions and bibasilar opacities, left greater than right, atelectasis versus infiltrate. 2.  Stable emphysematous and radiation change of the chest.  Original Report Authenticated By: Waynard Reeds, M.D.    Cardiac Studies: Tele - NSR Assessment/Plan:  1. Acute on chronic diastolic cHF - she has had a nice diuresis and her dyspnea is improved. Will plan to discharge this afternoon on lasix.  2. HTN - blood pressure is controlled. Will follow. 3. Anxiety - she can be discharged on Valium 5 mg daily for 10-14 days.  LOS: 1 day    Buel Ream.D. 09/22/2011, 8:13 AM

## 2011-09-22 NOTE — Discharge Summary (Signed)
Discharge Summary   Patient ID: Jaclyn [name] MRN: [id number], DOB/AGE: 67-Jul-1947 67 y.o.  Primary MD: Robb Matar, MD Primary Cardiologist: Dr. Eden Emms Admit date: 09/21/2011 D/C date:     09/22/2011      Primary Discharge Diagnoses:  1. Acute on Chronic Diastolic CHF  - EF 55-60%, grade 2 diastolic dysfunction by echo 06/2011  - Improved with IV diuresis, transitioned to oral  - Discharge weight 174lbs  - F/u BMET  2. Anxiety  - Initiated on trial of anxiolytics  Secondary Discharge Diagnoses:  1. Coronary artery disease s/p 3v CABG 09/11/2011 Free LIMA to LAD, SVG to D1, SVG to RPL, EVH via right thigh and leg  2. Aortic stenosis s/p AVR 09/11/2011 21 mm [facility] Ease pericardial tissue valve 3. Hypertension 4. Hyperlipidemia  5. DM type I - treated with insulin pump /w x85yrs   6. Hypothyroidism   7. Orthostatic hypotension  8. 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 Lung cancer 06/13/2011 Small Cell carcinoma treated with chemo + radiation therapy to mediastinum and chest  10. Radiation fibrosis of lung   11. Seizures 33 yrs. ago, was on phenobarbital for sometime, off it for 20 yrs.  12. GERD 13. Anemia blood transfusion- 6 yrs. ago by Dr. Arline Asp  14. Peripheral neuropathy  15. Allergic rhinitis   16. Dermatitis   17. Fibrocystic disease of breast   18. Gastroparesis  19. Osteoporosis   20. Menopause   21. Microalbuminuria   22. Vitamin d deficiency   87. Arthritis knees   24. Thyroidectomy partial- R   25. Total abdominal hysterectomy   26. Shoulder surgery bilateral- RCR-   27. Elbow surgery bilateral   28. Carpal tunnel release bilateral    Allergies Allergies  Allergen Reactions  . Statins     Joints ache...crestor...lipitor...crestor    Diagnostic Studies/Procedures:   09/21/11 - Right LE  Doppler Summary: No evidence of deep vein thrombosis involving the right lower extremity and left common femoral vein. There is superficial thrombus noted at the GSV harvest site. No evidence of Baker's cyst on the right.   History of Present Illness: 66 y.o. female w/ the above medical problems who presented to [facility] on 09/21/11 with complaints of dyspnea and chest tightness.On day of presentation she reported inability to sleep at night due to dyspnea and chest tightness associated with a deep breath prompting her to present to the ED.  Hospital Course: EKG revealed NSR with no acute ST/T changes. CXR showed grossly unchanged small bilateral pleural effusions and bibasilar opacities. Labs were significant for normal poc troponin, BNP 4252, WBC 14, Hgb 9.3, Crt 1.02, unremarkable UA. LE doppler was performed without evidence of RLE DVT.  It was felt her dyspnea was multifactorial in the setting of persistent bilateral pleural effusions, COPD, h/o Lung CA, and anxiety. She was admitted for further evaluation and treatment.   She was placed on anxiolytics and diuresed with improvement in her dyspnea. Lasix was transitioned to oral dosing. She was seen and evaluated by Dr. Ladona Ridgel who felt she was stable for discharge home with plans for follow up as scheduled below. Discharge weight 174lbs.  Discharge Vitals: Blood pressure 151/50, pulse 66, temperature 98 F (36.7 C), temperature source Oral, resp. rate 18, height 5\' 7"  (1.702 m), weight 174 lb 2.6 oz (79 kg), SpO2 99.00%.  Labs: Component Value Date   WBC 13.1* 09/22/2011   HGB 9.2* 09/22/2011   HCT 28.3* 09/22/2011   MCV 84.2 09/22/2011   PLT 606* 09/22/2011    Lab 09/22/11 0635  NA 131*  K 4.0  CL 89*  CO2 34*  BUN 17  CREATININE 1.04  CALCIUM 10.1  GLUCOSE 164*     09/21/2011 13:48  Pro B Natriuretic peptide (BNP) 4252.0 (H)     09/21/2011 13:15  Troponin i, poc 0.07    Discharge Medications   Medication List  As of  09/22/2011 12:38 PM   TAKE these medications         acetaminophen 500 MG tablet   Commonly known as: TYLENOL   Take 500 mg by mouth every 6 (six) hours as needed. For pain      aspirin 325 MG EC tablet   Take 325 mg by mouth daily.      cholecalciferol 1000 UNITS tablet   Commonly known as: VITAMIN D   Take 1,000 Units by mouth daily.      diazepam 5 MG tablet   Commonly known as: VALIUM   Take 1 tablet (5 mg total) by mouth daily.      EX-LAX PO   Take 2 tablets by mouth daily as needed. For constipation      furosemide 40 MG tablet   Commonly known as: LASIX   Take 1 tablet (40 mg total) by mouth 2 (two) times daily.      insulin pump 100 unit/ml Soln   Inject into the skin. humalog- basal rate continuously & boluses as appropriate to her carbs & blood glucose      levothyroxine 112 MCG tablet   Commonly known as: SYNTHROID, LEVOTHROID   Take 112 mcg by mouth daily before breakfast.      losartan 100 MG tablet   Commonly known as: COZAAR   Take 1 tablet (100 mg total) by mouth daily before breakfast.      magnesium hydroxide 400 MG/5ML suspension   Commonly known as: MILK OF MAGNESIA   Take 30 mLs by mouth daily as needed. For constipation      metoprolol 100 MG tablet   Commonly known as: LOPRESSOR   Take 1 tablet (100 mg total) by mouth 2 (two) times daily.      potassium chloride SA 20 MEQ tablet   Commonly known as: K-DUR,KLOR-CON   Take 1 tablet (20 mEq total) by mouth daily.      traMADol 50 MG tablet   Commonly known as: ULTRAM   Take 1-2 tablets (50-100 mg total) by mouth every 4 (four) hours as needed.            Disposition   Discharge Orders    Future Appointments: Provider: Department: Dept Phone: Center:   09/24/2011 9:45 AM Jesus Genera Nurse [facility] [phone] [facility]   10/07/2011 4:30 PM Purcell Nails, MD [facility] [place] [phone] [facility]     Future Orders Please Complete By Expires   Diet - low sodium heart healthy       Increase activity slowly      Discharge instructions      Comments:   **PLEASE REMEMBER TO BRING ALL OF YOUR MEDICATIONS TO EACH OF YOUR FOLLOW-UP OFFICE VISITS.     Follow-up Information    Follow up with Charlton Haws, MD in 1 week. (Our office will call you with your appointment time (Will need BMET))    Contact information:  [facility] [address], [address] [name] [address] [phone]           Outstanding Labs/Studies:  1. BMET  Duration of Discharge Encounter: Greater than 30 minutes including physician and PA time.  Signed, Christeena Krogh PA-C 09/22/2011, 12:38 PM

## 2011-09-23 NOTE — Progress Notes (Signed)
Utilization review completed.  

## 2011-09-24 ENCOUNTER — Telehealth: Payer: Self-pay | Admitting: *Deleted

## 2011-09-24 ENCOUNTER — Emergency Department (HOSPITAL_COMMUNITY)
Admission: EM | Admit: 2011-09-24 | Discharge: 2011-09-24 | Disposition: A | Discharge status: Home / Self Care | Payer: Medicare Other | Attending: Emergency Medicine | Admitting: Emergency Medicine

## 2011-09-24 ENCOUNTER — Ambulatory Visit (INDEPENDENT_AMBULATORY_CARE_PROVIDER_SITE_OTHER): Payer: Self-pay

## 2011-09-24 ENCOUNTER — Encounter (HOSPITAL_COMMUNITY): Payer: Self-pay | Admitting: *Deleted

## 2011-09-24 ENCOUNTER — Emergency Department (HOSPITAL_COMMUNITY): Payer: Medicare Other

## 2011-09-24 VITALS — BP 69/43 | HR 81 | Temp 97.5°F | Resp 18

## 2011-09-24 DIAGNOSIS — Z4802 Encounter for removal of sutures: Secondary | ICD-10-CM

## 2011-09-24 DIAGNOSIS — R609 Edema, unspecified: Secondary | ICD-10-CM | POA: Insufficient documentation

## 2011-09-24 DIAGNOSIS — I2510 Atherosclerotic heart disease of native coronary artery without angina pectoris: Secondary | ICD-10-CM

## 2011-09-24 DIAGNOSIS — I959 Hypotension, unspecified: Secondary | ICD-10-CM

## 2011-09-24 DIAGNOSIS — I25810 Atherosclerosis of coronary artery bypass graft(s) without angina pectoris: Secondary | ICD-10-CM | POA: Insufficient documentation

## 2011-09-24 DIAGNOSIS — R42 Dizziness and giddiness: Secondary | ICD-10-CM | POA: Insufficient documentation

## 2011-09-24 DIAGNOSIS — R0602 Shortness of breath: Secondary | ICD-10-CM | POA: Insufficient documentation

## 2011-09-24 DIAGNOSIS — R0989 Other specified symptoms and signs involving the circulatory and respiratory systems: Secondary | ICD-10-CM | POA: Insufficient documentation

## 2011-09-24 DIAGNOSIS — Z794 Long term (current) use of insulin: Secondary | ICD-10-CM | POA: Insufficient documentation

## 2011-09-24 DIAGNOSIS — E119 Type 2 diabetes mellitus without complications: Secondary | ICD-10-CM | POA: Insufficient documentation

## 2011-09-24 HISTORY — DX: Hypotension, unspecified: I95.9

## 2011-09-24 LAB — URINALYSIS, ROUTINE W REFLEX MICROSCOPIC
Glucose, UA: NEGATIVE mg/dL
Ketones, ur: NEGATIVE mg/dL
Protein, ur: NEGATIVE mg/dL
Urobilinogen, UA: 0.2 mg/dL (ref 0.0–1.0)

## 2011-09-24 LAB — CBC WITH DIFFERENTIAL/PLATELET
Basophils Absolute: 0 10*3/uL (ref 0.0–0.1)
Basophils Relative: 0 % (ref 0–1)
Eosinophils Absolute: 0.2 10*3/uL (ref 0.0–0.7)
Eosinophils Relative: 1 % (ref 0–5)
MCH: 27.8 pg (ref 26.0–34.0)
MCHC: 32.6 g/dL (ref 30.0–36.0)
MCV: 85.5 fL (ref 78.0–100.0)
Neutrophils Relative %: 86 % — ABNORMAL HIGH (ref 43–77)
Platelets: 675 10*3/uL — ABNORMAL HIGH (ref 150–400)
RBC: 3.52 MIL/uL — ABNORMAL LOW (ref 3.87–5.11)
RDW: 16.1 % — ABNORMAL HIGH (ref 11.5–15.5)

## 2011-09-24 LAB — BASIC METABOLIC PANEL
Calcium: 9.9 mg/dL (ref 8.4–10.5)
GFR calc Af Amer: 31 mL/min — ABNORMAL LOW (ref >90)
GFR calc non Af Amer: 27 mL/min — ABNORMAL LOW (ref >90)
Glucose, Bld: 152 mg/dL — ABNORMAL HIGH (ref 70–99)
Potassium: 4.4 mEq/L (ref 3.5–5.1)
Sodium: 132 mEq/L — ABNORMAL LOW (ref 135–145)

## 2011-09-24 MED ORDER — METOPROLOL TARTRATE 25 MG PO TABS: ORAL_TABLET | ORAL | Status: DC

## 2011-09-24 MED ORDER — SODIUM CHLORIDE 0.9 % IV SOLN
Freq: Once | INTRAVENOUS | Status: AC
  Administered 2011-09-24: 125 mL via INTRAVENOUS

## 2011-09-24 NOTE — Telephone Encounter (Signed)
PT'S  DAUGHTER AWARE OF  MED  CHANGE./CY 

## 2011-09-24 NOTE — Telephone Encounter (Signed)
SUSAN CALLED FROM DR Orvan July OFFICE   PT  THERE FOR SUTURE REMOVAL WHILE THERE INFORMED SUSAN  FEELS WEAK AND DIZZY   B/P AT THAT TIME WAS  75/49 WITH HR 80 CURRENTLY TAKING LOSARTAN 100 MG QD  METOPROLOL 100 MG BID AND LASIX 40 BID    DID NOT TAKE LOSARTAN  THIS AM B/P AT THIS TIME IS 69/40 PT GOING TO BE SENT TO ER FOR EVAL AND TREATMENT .Zack Seal

## 2011-09-24 NOTE — ED Provider Notes (Addendum)
History     CSN: [id number]  Arrival date & time 09/24/11  1100   First MD Initiated Contact with Patient 09/24/11 1143      Chief Complaint  Patient presents with  . hypotensive--post OHS     (Consider location/radiation/quality/duration/timing/severity/associated sxs/prior treatment) HPI Comments: Pt with hx of AS, CAD s/p CABG recently comes in with cc of hypotension. Pt reports that she was at the CT surgeons office, getting her staples removed, and was noted to have hypotension. Pt has had no changes made to her meds, no n/v/f/c - does endorse to poor appetite and intake. Pt has no chest pain, but she does have some dib. Pt states that after the surgery, initially she was able to walk from one room to another - but now, she get sob doing the same. No new leg swellings. Our records indicate that patient was admitted for dib just few days ago, and was discharged home after 1 day. She had lower extremity US ordered from the ED that were negative.  The history is provided by the patient.    Past Medical History  Diagnosis Date  . Allergic rhinitis   . Dermatitis   . Fibrocystic disease of breast   . Depression   . Foot pain   . Gastroparesis   . Hematuria   . Hyperlipidemia   . Hypothyroidism   . Menopause   . Microalbuminuria   . Osteoporosis   . Plantar fasciitis   . Keratosis   . Vitamin d deficiency   . Orthostatic hypotension   . Radiation fibrosis of lung   . 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 Aortic stenosis, severe 08/22/2011     s/p AVR 09/11/2011 21 mm [facility] Ease pericardial tissue valve  . Coronary artery disease 08/21/2011    s/p 3v CABG 09/11/2011 Free LIMA to LAD, SVG to D1, SVG to RPL, EVH via right thigh and leg   . S/P radiation therapy 08/22/2011    Radiation therapy to chest and mediastinum for small cell carcinoma of the lung  . Family history of anesthesia  complication     family history of N&V  . Anemia     blood transfusion- 6 yrs. ago by Dr. Arline Asp   . HTN (hypertension)     Dr. Eden Emms at [facility] takes care of cardiac needs, last stress test- 06/2011  . GERD (gastroesophageal reflux disease)   . Seizures     33 yrs. ago, was on phenobarbital for sometime, off it for 20 yrs.   . Peripheral neuropathy   . 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 DM (diabetes mellitus)     type I - treated with insulin pump /w x45yrs  . Arthritis     knees  . Anxiety   . Diastolic CHF     EF 55-60%, grade 2 diastolic dysfunction by echo 06/2011    Past Surgical History  Procedure Date  . Thyroidectomy     partial- R   . Total abdominal hysterectomy   . Shoulder surgery     bilateral- RCR-   . Elbow surgery     bilateral  . Carpal tunnel release     bilateral  . Cardiac catheterization  08/21/2011  . Multiple extractions with alveoloplasty 08/29/2011    Procedure: MULTIPLE EXTRACION WITH ALVEOLOPLASTY;  Surgeon: Charlynne Pander, DDS;  Location: [facility] OR;  Service: Oral Surgery;  Laterality: N/A;  extraction of teeth # 22, 27 with alveoloplasty  . Aortic valve replacement 09/11/2011    Procedure: AORTIC VALVE REPLACEMENT (AVR);  Surgeon: Purcell Nails, MD;  Location: [facility] OR;  Service: Open Heart Surgery;  Laterality: N/A;  . Coronary artery bypass graft 09/11/2011    Procedure: CORONARY ARTERY BYPASS GRAFTING (CABG);  Surgeon: Purcell Nails, MD;  Location: [facility] OR;  Service: Open Heart Surgery;  Laterality: N/A;  Times Three using left internal mammary artery and right greater saphenous vein harvested endoscopically: intraoperative TEE.    Family History  Problem Relation Age of Onset  . Alzheimer's disease    . Anemia    . Cancer    . Cirrhosis    . Colon cancer    . Coronary artery disease    . Heart  attack    . Heart disease    . Heart failure Mother   . Hypertension Mother   . Thyroid disease Mother   . Cancer Father     History  Substance Use Topics  . Smoking status: Former Smoker -- 2.0 packs/day for 43 years    Types: Cigarettes    Quit date: 08/27/1997  . Smokeless tobacco: Former Neurosurgeon    Quit date: 08/27/1997  . Alcohol Use: No    OB History    Grav Para Term Preterm Abortions TAB SAB Ect Mult Living                  Review of Systems  Constitutional: Positive for activity change.  HENT: Negative for facial swelling and neck pain.   Respiratory: Positive for shortness of breath. Negative for cough and wheezing.   Cardiovascular: Negative for chest pain.  Gastrointestinal: Negative for nausea, vomiting, abdominal pain, diarrhea, constipation, blood in stool and abdominal distention.  Genitourinary: Negative for dysuria, hematuria and difficulty urinating.  Skin: Negative for color change.  Neurological: Positive for light-headedness. Negative for syncope, speech difficulty and headaches.  Hematological: Does not bruise/bleed easily.  Psychiatric/Behavioral: Negative for confusion.    Allergies  Statins  Home Medications   Current Outpatient Rx  Name Route Sig Dispense Refill  . ACETAMINOPHEN 500 MG PO TABS Oral Take 500 mg by mouth every 6 (six) hours as needed. For pain    . ASPIRIN 325 MG PO TBEC Oral Take 325 mg by mouth daily.    Marland Kitchen VITAMIN D 1000 UNITS PO TABS Oral Take 1,000 Units by mouth daily.    . FUROSEMIDE 40 MG PO TABS Oral Take 1 tablet (40 mg total) by mouth 2 (two) times daily. 60 tablet 6  . INSULIN PUMP Subcutaneous Inject into the skin. humalog- basal rate continuously & boluses as appropriate to her carbs & blood glucose    . LEVOTHYROXINE SODIUM 112 MCG PO TABS Oral Take 112 mcg by mouth daily before breakfast.     . LOSARTAN POTASSIUM 100 MG PO TABS Oral Take 1 tablet (100 mg total) by mouth daily before breakfast. 30 tablet 1  .  MAGNESIUM HYDROXIDE 400 MG/5ML PO SUSP Oral Take 30 mLs by mouth daily as needed. For upset stomach    . POTASSIUM CHLORIDE CRYS ER 20 MEQ PO TBCR Oral Take 1 tablet (20 mEq total) by mouth daily. 30 tablet 6  . EX-LAX PO Oral Take 2  tablets by mouth daily as needed. For constipation    . DIAZEPAM 5 MG PO TABS Oral Take 1 tablet (5 mg total) by mouth daily. 14 tablet 0  . METOPROLOL TARTRATE 25 MG PO TABS  1 TAB PO TWICE DAILY ./CY 60 tablet 6    BP 101/43  Pulse 60  Temp 98.2 F (36.8 C) (Oral)  Resp 18  SpO2 100%  Physical Exam  Nursing note and vitals reviewed. Constitutional: She is oriented to person, place, and time. She appears well-developed and well-nourished.  HENT:  Head: Normocephalic and atraumatic.  Eyes: EOM are normal. Pupils are equal, round, and reactive to light.  Neck: Neck supple. JVD present.  Cardiovascular: Normal rate, regular rhythm and normal heart sounds.   No murmur heard. Pulmonary/Chest: Effort normal. No respiratory distress. She has rales.       Staples in the mid chest, poor air entry and crackles at the base, right worst than left  Abdominal: Soft. She exhibits no distension. There is no tenderness. There is no rebound and no guarding.  Musculoskeletal: She exhibits edema.  Neurological: She is alert and oriented to person, place, and time.  Skin: Skin is warm and dry.    ED Course  Procedures (including critical care time)  Labs Reviewed  CBC WITH DIFFERENTIAL - Abnormal; Notable for the following:    WBC 15.4 (*)     RBC 3.52 (*)     Hemoglobin 9.8 (*)     HCT 30.1 (*)     RDW 16.1 (*)     Platelets 675 (*)     Neutrophils Relative 86 (*)     Neutro Abs 13.2 (*)     Lymphocytes Relative 6 (*)     All other components within normal limits  BASIC METABOLIC PANEL - Abnormal; Notable for the following:    Sodium 132 (*)     Chloride 91 (*)     Glucose, Bld 152 (*)     BUN 30 (*)     Creatinine, Ser 1.88 (*)     GFR calc non Af Amer  27 (*)     GFR calc Af Amer 31 (*)     All other components within normal limits  TROPONIN I  URINALYSIS, ROUTINE W REFLEX MICROSCOPIC   Dg Chest Port 1 View  09/24/2011  *RADIOLOGY REPORT*  Clinical Data: Hypotension.  Status post cardiac surgery 09/11/2011.  PORTABLE CHEST - 1 VIEW  Comparison: PA and lateral chest 09/21/2011.  Findings: Small bilateral pleural effusions and basilar atelectasis, greater on the left, have decreased.  There is no pulmonary edema.  Heart size is upper normal.  No pneumothorax.  IMPRESSION: Decreased small bilateral pleural effusions and basilar atelectasis, greater on the left.  No new abnormality.  Original Report Authenticated By: Bernadene Bell. Maricela Curet, M.D.     No diagnosis found.    MDM   Date: 09/24/2011  Rate: 80  Rhythm: normal sinus rhythm  QRS Axis: left  Intervals: normal  ST/T Wave abnormalities: nonspecific ST/T changes  Conduction Disutrbances:none  Narrative Interpretation:   Old EKG Reviewed: none available  DDx includes: Orthostatic hypotension PE Vasovagal/neurocardiogenic syncope Aortic stenosis Valvular disorder/Cardiomyopathy Anemia Pericardial effusion Medication side effects Sepsis syndrome Shock  Pt comes in with cc of hypotension. Pt asymptomatic, but the pressure was documented low in the clinic, and low again by [place] in the ED. Pt is asymptomatic, mentating well - and concerns for underlying shock is low, and concerns for underlying  infectious etiology, PE etc is low as well. Now patient does have complicated cardiac hx, and hemodynamics - and she is on antihypertensives in addition. We will call Cardiology, and give a small fluid bolus.     Derwood Kaplan, MD 09/24/11 1505  BP remains stable in the ED. Labs show Cr elevation from 1.1 to 1.8, otherwise stable. Cardiology team has seen the patient, and their assessment is that patient can go home with Cardiology follow up as scheduled. Advised patient to keep track  of BP and call pcp/cardioology to do a med rec if pressures continue to be low.  Derwood Kaplan, MD 09/24/11 1630

## 2011-09-24 NOTE — ED Notes (Signed)
Cardiologist at bedside.  

## 2011-09-24 NOTE — H&P (Addendum)
CARDIOLOGY ADMISSION NOTE  Patient ID: Jaclyn [name] MRN: [id number] DOB/AGE: 67-Dec-1947 67 y.o.  Admit date: 09/24/2011 Primary Physician   Robb Matar, MD Primary Cardiologist   Dr. Eden Emms Chief Complaint    Hypotension  HPI:  The patient has a history of CAD with CABG on 08/24/11.  She had AVR with a tissue valve at that time.  She was readmitted a few days after discharged and treated for dyspnea.  She was treated with some diuresis and anxiolytics.    Today the patient was at the surgical office getting sutures removed.  She was noted to have a BP of 69/47 and was sent to the ER.  She reported that she had been feeling weak and dizzy and that she did not take her Cozaar this am or last PM.  She reports that she's had decreased appetite and has not been drinking. He was getting progressively weaker prior to today's appointment. She's not been having any shortness of breath she was having previously. She denies any PND or orthopnea. Lower extremity swelling is much improved. He denies any chest pressure, neck or arm discomfort. Said palpitations. She's had no cough fevers or chills. She doesn't she's had blurry vision.    Past Medical History  Diagnosis Date  . Allergic rhinitis   . Dermatitis   . Fibrocystic disease of breast   . Depression   . Foot pain   . Gastroparesis   . Hematuria   . Hyperlipidemia   . Hypothyroidism   . Menopause   . Microalbuminuria   . Osteoporosis   . Plantar fasciitis   . Keratosis   . Vitamin d deficiency   . Orthostatic hypotension   . Radiation fibrosis of lung   . 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 Aortic stenosis, severe   . Coronary artery disease   . S/P radiation therapy     Radiation therapy to chest and mediastinum for small cell carcinoma of the lung  . Anemia     blood transfusion- 6 yrs. ago by Dr. Arline Asp   . HTN (hypertension)     Dr.  Eden Emms at [facility] takes care of cardiac needs, last stress test- 06/2011  . GERD (gastroesophageal reflux disease)   . Seizures     32 yrs. ago, was on phenobarbital for sometime, off it for 20 yrs.   . Peripheral neuropathy   . DM (diabetes mellitus)     type I - treated with insulin pump /w x75yrs  . Arthritis     knees  . Anxiety   . Diastolic CHF     EF 55-60%, grade 2 diastolic dysfunction by echo 06/2011    Past Surgical History  Procedure Date  . Thyroidectomy     partial- R   . Total abdominal hysterectomy   . Shoulder surgery     bilateral- RCR-   . Elbow surgery     bilateral  . Carpal tunnel release     bilateral  . Multiple extractions with alveoloplasty 08/29/2011    Procedure: MULTIPLE EXTRACION WITH ALVEOLOPLASTY;  Surgeon: Charlynne Pander, DDS;  Location: [facility] OR;  Service: Oral Surgery;  Laterality: N/A;  extraction of teeth # 22, 27 with alveoloplasty  . Aortic valve replacement 09/11/2011    21 mm [facility] Ease pericardial tissue valve  . Coronary artery bypass graft 09/11/2011    LIMA  to LAD, SVG to D1, SVG to RPL    Allergies  Allergen Reactions  . Statins     Joints ache...crestor...lipitor...crestor   Current Facility-Administered Medications on File Prior to Encounter  Medication Dose Route Frequency Provider Last Rate Last Dose  . albuterol (PROVENTIL) (5 MG/ML) 0.5% nebulizer solution 2.5 mg  2.5 mg Nebulization Once Purcell Nails, MD       Current Outpatient Prescriptions on File Prior to Encounter  Medication Sig Dispense Refill  . acetaminophen (TYLENOL) 500 MG tablet Take 500 mg by mouth every 6 (six) hours as needed. For pain      . aspirin 325 MG EC tablet Take 325 mg by mouth daily.      . cholecalciferol (VITAMIN D) 1000 UNITS tablet Take 1,000 Units by mouth daily.      . furosemide (LASIX) 40 MG tablet Take 1 tablet (40 mg total) by mouth 2 (two) times daily.  60 tablet  6  . Insulin Human (INSULIN PUMP) 100 unit/ml SOLN Inject into  the skin. humalog- basal rate continuously & boluses as appropriate to her carbs & blood glucose      . levothyroxine (SYNTHROID, LEVOTHROID) 112 MCG tablet Take 112 mcg by mouth daily before breakfast.       . losartan (COZAAR) 100 MG tablet Take 1 tablet (100 mg total) by mouth daily before breakfast.  30 tablet  1  . magnesium hydroxide (MILK OF MAGNESIA) 400 MG/5ML suspension Take 30 mLs by mouth daily as needed. For upset stomach      . potassium chloride SA (K-DUR,KLOR-CON) 20 MEQ tablet Take 1 tablet (20 mEq total) by mouth daily.  30 tablet  6  . Sennosides (EX-LAX PO) Take 2 tablets by mouth daily as needed. For constipation      . DISCONTD: metoprolol (LOPRESSOR) 100 MG tablet Take 1 tablet (100 mg total) by mouth 2 (two) times daily.  60 tablet  1  . diazepam (VALIUM) 5 MG tablet Take 1 tablet (5 mg total) by mouth daily.  14 tablet  0  . metoprolol tartrate (LOPRESSOR) 25 MG tablet 1 TAB PO TWICE DAILY ./CY  60 tablet  6   History   Social History  . Marital Status: Divorced    Spouse Name: N/A    Number of Children: 2  . Years of Education: N/A   Occupational History  . Not on file.   Social History Main Topics  . Smoking status: Former Smoker -- 2.0 packs/day for 43 years    Types: Cigarettes    Quit date: 08/27/1997  . Smokeless tobacco: Former Neurosurgeon    Quit date: 08/27/1997  . Alcohol Use: No  . Drug Use: No  . Sexually Active: Not on file   Other Topics Concern  . Not on file   Social History Narrative   The patient is divorced.The patient has 2 daughters. Patient used to smoke between 1-2.5 packs per day for 43 years. Patient quit smoking 12 years ago approximately 2001 after her lung cancer diagnosis.    Family History  Problem Relation Age of Onset  . Alzheimer's disease    . Anemia    . Cancer    . Cirrhosis    . Colon cancer    . Coronary artery disease    . Heart attack    . Heart disease    . Heart failure Mother   . Hypertension Mother   .  Thyroid disease Mother   . Cancer  Father     ROS:    As stated in the HPI and negative for all other systems.  Physical Exam: Blood pressure 101/43, pulse 60, temperature 98.2 F (36.8 C), temperature source Oral, resp. rate 18, SpO2 100.00%.  GENERAL:  Well appearing, no distress HEENT:  Pupils equal round and reactive, fundi not visualized, oral mucosa unremarkable, dentures NECK:  No jugular venous distention, waveform within normal limits, carotid upstroke brisk and symmetric, no bruits, no thyromegaly LYMPHATICS:  No cervical, inguinal adenopathy LUNGS:  Clear to auscultation bilaterally BACK:  No CVA tenderness CHEST:  Recent sternotomy without drainage or erythema.   HEART:  PMI not displaced or sustained,S1 and S2 within normal limits, no S3, no S4, no clicks, no rubs, distant heart sounds ABD:  Flat, positive bowel sounds normal in frequency in pitch, no bruits, no rebound, no guarding, no midline pulsatile mass, no hepatomegaly, no splenomegaly EXT:  2 plus pulses throughout, moderate bilateral lower extremity edema, no cyanosis no clubbing SKIN:  No rashes no nodules NEURO:  Cranial nerves II through XII grossly intact, motor grossly intact throughout PSYCH:  Cognitively intact, oriented to person place and time  Labs: Lab Results  Component Value Date   BUN 30* 09/24/2011   Lab Results  Component Value Date   CREATININE 1.88* 09/24/2011   Lab Results  Component Value Date   NA 132* 09/24/2011   K 4.4 09/24/2011   CL 91* 09/24/2011   CO2 30 09/24/2011   Lab Results  Component Value Date   TROPONINI <0.30 09/24/2011   Lab Results  Component Value Date   WBC 15.4* 09/24/2011   HGB 9.8* 09/24/2011   HCT 30.1* 09/24/2011   MCV 85.5 09/24/2011   PLT 675* 09/24/2011     Radiology:  CXR:  Decreased small bilateral pleural effusions and basilar atelectasis, greater on the left. No new abnormality.  EKG:  NSR, rate 70 with PVCs, mild QT prolongation, nonspecific ST T wave  changes.  09/24/2011  ASSESSMENT AND PLAN:    Hypotension:  This is related to her medications and decreased by mouth intake. He will be admitted overnight. I will hold her diuretics, losartan and beta blocker tonight.  Medications can be titrated further in the morning based on her blood pressure.  Coronary artery disease s/p CABG Aortic stenosis s/p AVR [facility] Ease pericardial tissue valve):   As above  DM type I : She will continue with her insulin and sliding scale insulin.   Hyperlipidemia:   There is no recent lipid profile.  I will defer to Dr. Eden Emms to follow up as an outpatient.   Hypothyroidism :  I do not see a recent TSH and I will draw one.   Renal insufficiency:  This is likely related to the diuresis.  This will be followed.  Anemia:  Stable  Leukocytosis:  This and the elevated platelets can be followed.  There is no active evidence of infection.   UA:  Slight abnormalities on the UA.  Repeat sample.   SignedRollene Rotunda 09/24/2011, 2:54 PM   Addendum:  I was called back to talk with the patient.  She was quite upset about the possibility of admission again.  Her repeat blood pressures in the ER have demonstrated systolic readings all above 100 although the diastolics are low.  She feels well now and if the plan is to hold her meds and observe she would prefer to do this at home.  Her family will be  able to take her BP frequently at home and be with her around the clock.  She agrees to increase her PO intake particularly of fluids.  She will hold her Lasix, losartan, metoprolol and KDUR.  She can continue the other medications as prescribed.  She will call our office in the AM with her BP readings.  She will go her her primary care MD on Thursday for a BMET and CBC.  She should also have a repeat UA.  I will send this note to Dr. Eden Emms and his staff and we will follow up by phone in the AM.  The family agrees to call 911 for transport if she has any further  symptoms.

## 2011-09-24 NOTE — Progress Notes (Signed)
Removed 20 sutures from sternal incision, removed 10 sutures from Rt EVH site, no signs of infection and pt tolerated well. Removed 3 steristrips from chest tube sites that were dry and coated with old drainage. Cleaned sites with hydrogen peroxide. Instructed pt to clean with soap and water and keep dry.  3rd BP reading was 69/47 HR 80. Went over medications with pt and she felt like she was taking to much BP meds. Called Dr Fabio Bering office and spoke with his nurse. She said that she would speak with him about adjusting her medications and call her later that day. I Instructed pt to go to ED for eval on BP.

## 2011-09-24 NOTE — ED Notes (Signed)
Pt came from Dr. Cornelius Moras with low bp.  BP is 62/50.  Pt is pale.  Pt had OHS on 09/11/11.  Pt is having back strain.  Diarrhea yesterday.  Pt is on ASA.

## 2011-09-24 NOTE — Telephone Encounter (Signed)
Would stop losartin and diuretic Decrease toprol to 25 mg start in 48 hours

## 2011-09-25 ENCOUNTER — Telehealth: Payer: Self-pay | Admitting: *Deleted

## 2011-09-25 NOTE — Telephone Encounter (Signed)
SPOKE WITH PT RE PREVIOUS MESSAGE FROM DR Barry Dienes OFFICE YESTERDAY PER  PT IS FEELING MUCH BETTER TODAY B/P IS RUNNING BETWEEN 104-111/60-69    HAS FOLLOW UP ON MON WITH CHRIS BERGE NP .Jaclyn [name]

## 2011-09-30 ENCOUNTER — Ambulatory Visit (INDEPENDENT_AMBULATORY_CARE_PROVIDER_SITE_OTHER): Payer: Medicare Other | Admitting: Nurse Practitioner

## 2011-09-30 ENCOUNTER — Other Ambulatory Visit (INDEPENDENT_AMBULATORY_CARE_PROVIDER_SITE_OTHER): Payer: Medicare Other

## 2011-09-30 ENCOUNTER — Encounter: Payer: Self-pay | Admitting: Nurse Practitioner

## 2011-09-30 VITALS — BP 110/60 | HR 87 | Ht 67.0 in | Wt 172.8 lb

## 2011-09-30 DIAGNOSIS — I2510 Atherosclerotic heart disease of native coronary artery without angina pectoris: Secondary | ICD-10-CM

## 2011-09-30 DIAGNOSIS — R0989 Other specified symptoms and signs involving the circulatory and respiratory systems: Secondary | ICD-10-CM

## 2011-09-30 DIAGNOSIS — I509 Heart failure, unspecified: Secondary | ICD-10-CM

## 2011-09-30 DIAGNOSIS — I5032 Chronic diastolic (congestive) heart failure: Secondary | ICD-10-CM

## 2011-09-30 DIAGNOSIS — I350 Nonrheumatic aortic (valve) stenosis: Secondary | ICD-10-CM

## 2011-09-30 DIAGNOSIS — I359 Nonrheumatic aortic valve disorder, unspecified: Secondary | ICD-10-CM

## 2011-09-30 MED ORDER — LOSARTAN POTASSIUM 25 MG PO TABS: 25.0000 mg | ORAL_TABLET | Freq: Every day | ORAL | Status: DC

## 2011-09-30 NOTE — Patient Instructions (Addendum)
Your physician recommends that you schedule a follow-up appointment in: 2 months with Dr Eden Emms Your physician has recommended you make the following change in your medication: START Losartan 25 mg daily

## 2011-09-30 NOTE — Progress Notes (Signed)
Patient Name: Jaclyn [name] Date of Encounter: 09/30/2011  Primary Care Provider:  Robb Matar, MD Primary Cardiologist:  Shela Commons. Hochrein, MD  Patient Profile  66 year old female status post recent aortic valve replacement and CABG x3 who presents for followup.  Problem List   Past Medical History  Diagnosis Date  . Allergic rhinitis   . Dermatitis   . Fibrocystic disease of breast   . Depression   . Foot pain   . Gastroparesis   . Hematuria   . Hyperlipidemia   . Hypothyroidism   . Menopause   . Microalbuminuria   . Osteoporosis   . Plantar fasciitis   . Keratosis   . Vitamin d deficiency   . Orthostatic hypotension   . Radiation fibrosis of lung   . 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 Coronary artery disease     a. 09/11/2011 CABGx 3: LIMA->LAD, VG->D1, VG->PLV (with AVR - 21mm [facility] Ease Pericardial Tissue Valve).  . S/P radiation therapy     Radiation therapy to chest and mediastinum for small cell carcinoma of the lung  . Anemia     blood transfusion- 6 yrs. ago by Dr. Arline Asp   . HTN (hypertension)     a. 09/2011 BP's running low (in setting of weight loss and elimination of salt from diet).  . GERD (gastroesophageal reflux disease)   . Seizures     32 yrs. ago, was on phenobarbital for sometime, off it for 20 yrs.   . Peripheral neuropathy   . DM (diabetes mellitus)     type I - treated with insulin pump /w x69yrs  . Arthritis     knees  . Anxiety   . Diastolic CHF     a. EF 55-60%, grade 2 diastolic dysfunction by echo 06/2011  . Aortic stenosis     a. 09/11/2011 s/p AVR - 21mm [employer] Magna Ease Pericardial Tissue Valve (with CABG x 3 as above).   Past Surgical History  Procedure Date  . Thyroidectomy     partial- R   . Total abdominal hysterectomy   . Shoulder surgery     bilateral- RCR-   . Elbow surgery     bilateral  . Carpal tunnel release    bilateral  . Multiple extractions with alveoloplasty 08/29/2011    Procedure: MULTIPLE EXTRACION WITH ALVEOLOPLASTY;  Surgeon: Charlynne Pander, DDS;  Location: [facility] OR;  Service: Oral Surgery;  Laterality: N/A;  extraction of teeth # 22, 27 with alveoloplasty  . Aortic valve replacement 09/11/2011    21 mm [facility] Ease pericardial tissue valve  . Coronary artery bypass graft 09/11/2011    LIMA to LAD, SVG to D1, SVG to RPL    Allergies  Allergies  Allergen Reactions  . Statins     Joints ache...crestor...lipitor...crestor    HPI  66 year old female with the above problem list.  She has had multiple hospital visits the summer.  Following her last visit here, she underwent 2-D echocardiogram and Myoview which showed moderate aortic stenosis along with significant ischemia respectively.  This was followed by diagnostic catheterization in July revealing significant left main disease,and subsequently thoracic surgery evaluation.  Patient underwent aortic valve replacement and coronary artery bypass grafting on July 31.  She was initially discharged August 6 and then readmitted August 10 secondary to dyspnea and anxiety.  She was placed on an  anxiolytic and subsequent discharge August 11.  She re-presented to the ED on August 13 secondary to hypotension.  She was advised to hold some of her medications and was discharged from the ED.  Since that ED visit, she is slowly but surely improving in strength.  She does have some incisional chest discomfort.  She's not having any significant dyspnea exertion.  She has been weighing herself daily and is down 10 pounds since the beginning of August.  In the setting of weight loss, coupled with a significant reduction in her previous salt intake (she notes that she was previously using a large bore Parmesan cheese shaker as a salt shaker), her blood pressures at home have been significantly improved.  Whereas, she was preceded taking losartan 50 mg daily along  with Lasix 40 mg b.i.d., and Lopressor 25 mg b.i.d., she's currently not taking any of these things and her blood pressures have been running in the low 100s as documented in a notebook which she brought with her today.  She denies PND, orthopnea, dizziness, syncope.  She does have mild right lower extremity edema and bruising surrounding incision sites with a small hematoma on the medial aspect of her right thigh.  She notes that this has been stable.  Home Medications  Prior to Admission medications   Medication Sig Start Date End Date Taking? Authorizing Provider  acetaminophen (TYLENOL) 500 MG tablet Take 500 mg by mouth every 6 (six) hours as needed. For pain   Yes Historical Provider, MD  aspirin 325 MG EC tablet Take 325 mg by mouth daily.   Yes Historical Provider, MD  cholecalciferol (VITAMIN D) 1000 UNITS tablet Take 1,000 Units by mouth daily.   Yes Historical Provider, MD  Insulin Human (INSULIN PUMP) 100 unit/ml SOLN Inject into the skin. humalog- basal rate continuously & boluses as appropriate to her carbs & blood glucose   Yes Historical Provider, MD  levothyroxine (SYNTHROID, LEVOTHROID) 112 MCG tablet Take 112 mcg by mouth daily before breakfast.    Yes Historical Provider, MD  metoprolol tartrate (LOPRESSOR) 25 MG tablet 1 TAB PO TWICE DAILY .Zack Seal 09/24/11  Yes Wendall Stade, MD  Sennosides (EX-LAX PO) Take 2 tablets by mouth daily as needed. For constipation   Yes Historical Provider, MD  diazepam (VALIUM) 5 MG tablet Take 1 tablet (5 mg total) by mouth daily. 09/22/11 10/02/11  Jessica A Hope, PA-C  furosemide (LASIX) 40 MG tablet Take 1 tablet (40 mg total) by mouth 2 (two) times daily. 09/22/11 09/21/12  Jessica A Hope, PA-C  losartan (COZAAR) 25 MG tablet Take 1 tablet (25 mg total) by mouth daily before breakfast. 09/30/11 09/29/12  Ok Anis, NP  magnesium hydroxide (MILK OF MAGNESIA) 400 MG/5ML suspension Take 30 mLs by mouth daily as needed. For upset stomach     Historical Provider, MD  potassium chloride SA (K-DUR,KLOR-CON) 20 MEQ tablet Take 1 tablet (20 mEq total) by mouth daily. 09/22/11 09/21/12  Jessica A Hope, PA-C    Review of Systems  Her exercise tolerance is improving.  She still slow in getting around and has had some incisional chest discomfort.  Lower extremities are tender though improving.  All other systems reviewed and are otherwise negative except as noted above.  Physical Exam  Blood pressure 110/60, pulse 87, height 5\' 7"  (1.702 m), weight 172 lb 12.8 oz (78.382 kg), SpO2 97.00%.  General: Pleasant, NAD Psych: Normal affect. Neuro: Alert and oriented X 3. Moves all extremities spontaneously. HEENT:  Normal  Neck: Supple without JVD.  She has bilat bruits vs radiated murmur. Lungs:  Resp regular and unlabored, CTA with slightly diminished breath sounds in the left base. Heart: RRR no s3, s4.  2/6 SEM bilat USB - radiating to neck. Abdomen: Soft, non-tender, non-distended, BS + x 4.  Extremities: No clubbing, cyanosis.  1+edema on right with echomosis and small hematoma on medial aspect of right thigh.  Incision sites healing well. DP/PT/Radials 1+ and equal bilaterally.  Accessory Clinical Findings  ECG - sinus rhythm, 95, right atrial enlargement, no acute changes.  Assessment & Plan  1.  Coronary artery disease: Status post coronary artery bypass grafting x3.  She has had no chest pain and incisions are healing well.  She is currently only on aspirin therapy.  She is previous intolerance to statins.  She is not currently on a beta blocker as result of low blood pressures at home which resulted in an ER visit secondary to hypotension.  2.  Aortic stenosis: Status post aortic valve replacement with pericardial tissue valve.  She is followup with thoracic surgery next week.  3.  Hypotension: Patient's had significant improvement in her blood pressure management with weight loss and sodium restriction.  Pressure today is 110/60  on no meds.  We did discuss how we would ideally like to be on a little bit of metoprolol along with Cozaar (she was previously on 100 mg daily) given her diabetic history.  She is asked if she can go back on Cozaar stating that she wants to protect her kidneys and we have provided a prescription for 25 mg daily.  We'll try to add back beta blocker in the future.  She recently had blood work her primary care providers showing stable renal function.  4.  Chronic diastolic CHF: She is euvolemic on exam.  As above, which has been coming down with dietary changes.  Nicolasa Ducking, NP 09/30/2011, 5:10 PM

## 2011-10-02 ENCOUNTER — Other Ambulatory Visit: Payer: Self-pay | Admitting: Thoracic Surgery (Cardiothoracic Vascular Surgery)

## 2011-10-02 DIAGNOSIS — I2510 Atherosclerotic heart disease of native coronary artery without angina pectoris: Secondary | ICD-10-CM

## 2011-10-07 ENCOUNTER — Ambulatory Visit (INDEPENDENT_AMBULATORY_CARE_PROVIDER_SITE_OTHER): Payer: Self-pay | Admitting: Thoracic Surgery (Cardiothoracic Vascular Surgery)

## 2011-10-07 ENCOUNTER — Ambulatory Visit
Admission: RE | Admit: 2011-10-07 | Discharge: 2011-10-07 | Disposition: A | Discharge status: Home / Self Care | Payer: Medicare Other | Source: Ambulatory Visit | Attending: Thoracic Surgery (Cardiothoracic Vascular Surgery) | Admitting: Thoracic Surgery (Cardiothoracic Vascular Surgery)

## 2011-10-07 ENCOUNTER — Encounter: Payer: Self-pay | Admitting: Thoracic Surgery (Cardiothoracic Vascular Surgery)

## 2011-10-07 VITALS — BP 143/63 | HR 100 | Resp 16 | Ht 67.0 in | Wt 170.2 lb

## 2011-10-07 DIAGNOSIS — I2510 Atherosclerotic heart disease of native coronary artery without angina pectoris: Secondary | ICD-10-CM

## 2011-10-07 DIAGNOSIS — Z951 Presence of aortocoronary bypass graft: Secondary | ICD-10-CM

## 2011-10-07 DIAGNOSIS — Z953 Presence of xenogenic heart valve: Secondary | ICD-10-CM

## 2011-10-07 DIAGNOSIS — Z952 Presence of prosthetic heart valve: Secondary | ICD-10-CM

## 2011-10-07 DIAGNOSIS — Z954 Presence of other heart-valve replacement: Secondary | ICD-10-CM

## 2011-10-07 HISTORY — DX: Presence of prosthetic heart valve: Z95.2

## 2011-10-07 NOTE — Progress Notes (Signed)
[address]            [name] [address]          [phone]     CARDIOTHORACIC SURGERY OFFICE NOTE  Referring Provider is Wendall Stade, MD PCP is Robb Matar, MD   HPI:  Patient returns for followup status post aortic valve replacement and coronary artery bypass grafting x3 on 09/11/2011. The patient's initial postoperative recovery was uncomplicated. Shortly after discharge she was readmitted to the hospital for some chest pain and shortness of breath that seemed to be related to musculoskeletal pain from her original surgery. She was again discharged from the hospital but since then has had problems with orthostatic hypotension for which she was seen in the emergency department on 09/24/2011. At that time all of her blood pressure medications were put on hold and she has since then done better. She was seen in followup by Dr. Fabio Bering office last week and told to resume taking low-dose Cozaar. Since then she tried taking Cozaar twice but this is only made her dizziness worse so she has again stopped. She now reports that she is no longer having any significant pain other than occasional mild soreness at night when she is resting. She's not taking any sort of pain relievers. She has not had any shortness of breath. Her appetite has been slow to improve but she is eating some. Her weight has continued to trend down since hospital discharge but it seems to have stabilized now. Her blood sugars have been under good control. She continues to have dizziness when she first and is up usually within a few seconds it seems to improve and resolve. She has not had any syncope. She's not had any tachypalpitations.    Current Outpatient Prescriptions  Medication Sig Dispense Refill  . aspirin 325 MG EC tablet Take 325 mg by mouth daily.      . cholecalciferol (VITAMIN D) 1000 UNITS tablet Take 1,000 Units by mouth daily.      . Insulin Human (INSULIN PUMP) 100  unit/ml SOLN Inject into the skin. humalog- basal rate continuously & boluses as appropriate to her carbs & blood glucose      . levothyroxine (SYNTHROID, LEVOTHROID) 112 MCG tablet Take 112 mcg by mouth daily before breakfast.       . Sennosides (EX-LAX PO) Take 2 tablets by mouth daily as needed. For constipation      . acetaminophen (TYLENOL) 500 MG tablet Take 500 mg by mouth every 6 (six) hours as needed. For pain      . losartan (COZAAR) 25 MG tablet Take 1 tablet (25 mg total) by mouth daily before breakfast.  30 tablet  2  . magnesium hydroxide (MILK OF MAGNESIA) 400 MG/5ML suspension Take 30 mLs by mouth daily as needed. For upset stomach       No current facility-administered medications for this visit.   Facility-Administered Medications Ordered in Other Visits  Medication Dose Route Frequency Provider Last Rate Last Dose  . albuterol (PROVENTIL) (5 MG/ML) 0.5% nebulizer solution 2.5 mg  2.5 mg Nebulization Once Purcell Nails, MD          Physical Exam:   BP 143/63  Pulse 100  Resp 16  Ht 5\' 7"  (1.702 m)  Wt 170 lb 3.2 oz (77.202 kg)  BMI 26.66 kg/m2  SpO2 98%  General:  Well-appearing  Chest:   Clear to auscultation  CV:   Regular rate and rhythm with occasional ectopic beat  Incisions:  Clean and dry and healing nicely, sternum is stable on palpation  Abdomen:  Soft and nontender  Extremities:  Warm and well-perfused  Diagnostic Tests:  *RADIOLOGY REPORT*   Clinical Data: Post CABG, 3-week follow up appointment; past history lung cancer post radiation therapy, coronary artery disease post CABG, hypertension, CHF, GERD, aortic stenosis post AVR   CHEST - 2 VIEW   Comparison: 09/24/2011   Findings: Upper normal heart size post CABG and AVR. Atherosclerotic calcification aorta. Pulmonary vascularity normal. Elevation of right diaphragm secondary to right upper lobe volume loss and scarring, unchanged from preoperative appearance, likely related to history  lung cancer and radiation therapy. Small left pleural effusion and left basilar atelectasis. Underlying emphysematous changes. No pneumothorax. Tiny calcified granuloma right upper right chest. Remaining lungs clear. Bones demineralized.   IMPRESSION: Postsurgical changes of CABG and AVR. COPD with right upper lobe scarring likely related to history of lung cancer and radiation therapy. Small persistent left pleural effusion and basilar atelectasis.     Original Report Authenticated By: Lollie Marrow, M.D.    Impression:  Patient is progressing reasonably well almost 4 weeks status post aortic valve replacement and coronary artery bypass grafting. She has had considerable problems with orthostatic hypotension but this seems to be improving since her metoprolol, cozaar and diuretics have been stopped.  She otherwise seems to be doing reasonably well under the circumstances.   Plan:  I've encouraged patient to continue to gradually increase her physical activity as tolerated. Until her dizziness resolve she should not be driving. I've reminded her to avoid any sort of strenuous physical activity. Once she is feeling better and able to get around more I think she would benefit from the cardiac rehabilitation program, but I doubt that she will be ready for at least a few more weeks. We have not made any further recommendations regarding her medications. I certainly would hope to be able to get her back on a beta blocker and her Cozaar once her blood pressure will tolerate it. We will leave all subsequent decisions regarding her medications to Dr. Eden Emms and his colleagues. We will have the patient return in 3 months for further followup. At some point it would be reasonable to check a followup echocardiogram as well.   Salvatore Decent. Cornelius Moras, MD 10/07/2011 4:35 PM

## 2011-10-07 NOTE — Patient Instructions (Signed)
The patient has been reminded to continue to avoid any heavy lifting or strenuous use of arms or shoulders for at least a total of three months from the time of surgery. Patient has been reminded that she should not be driving an automobile until her problems with dizzy spells have completely resolved

## 2011-11-25 ENCOUNTER — Encounter (HOSPITAL_COMMUNITY): Payer: Self-pay

## 2011-12-05 ENCOUNTER — Encounter: Payer: Self-pay | Admitting: Cardiovascular Disease

## 2011-12-05 ENCOUNTER — Ambulatory Visit (INDEPENDENT_AMBULATORY_CARE_PROVIDER_SITE_OTHER): Payer: Medicare Other | Admitting: Cardiovascular Disease

## 2011-12-05 VITALS — BP 155/84 | HR 100 | Wt 171.0 lb

## 2011-12-05 DIAGNOSIS — I350 Nonrheumatic aortic (valve) stenosis: Secondary | ICD-10-CM

## 2011-12-05 DIAGNOSIS — E785 Hyperlipidemia, unspecified: Secondary | ICD-10-CM

## 2011-12-05 DIAGNOSIS — I2510 Atherosclerotic heart disease of native coronary artery without angina pectoris: Secondary | ICD-10-CM

## 2011-12-05 DIAGNOSIS — E119 Type 2 diabetes mellitus without complications: Secondary | ICD-10-CM

## 2011-12-05 DIAGNOSIS — I959 Hypotension, unspecified: Secondary | ICD-10-CM

## 2011-12-05 DIAGNOSIS — I359 Nonrheumatic aortic valve disorder, unspecified: Secondary | ICD-10-CM

## 2011-12-05 NOTE — Assessment & Plan Note (Signed)
S/P CABG : Stable with no angina and good activity level.  Continue medical Rx

## 2011-12-05 NOTE — Assessment & Plan Note (Addendum)
Improved with med adjustment No orthostasis in office today

## 2011-12-05 NOTE — Assessment & Plan Note (Signed)
Cholesterol is at goal.  Continue current dose of statin and diet Rx.  No myalgias or side effects.  F/U  LFT's in 6 months. No results found for this basename: LDLCALC             

## 2011-12-05 NOTE — Assessment & Plan Note (Signed)
S/P tissue AVR sounds normal on exam  F/U echo for baseline gradients and EF

## 2011-12-05 NOTE — Progress Notes (Signed)
Patient ID: Jaclyn [name], female   DOB: [birth year], 66 y.o.   MRN: [id number] Patient returns for followup status post aortic valve replacement and coronary artery bypass grafting x3 on 09/11/2011. The patient's initial postoperative recovery was uncomplicated. Shortly after discharge she was readmitted to the hospital for some chest pain and shortness of breath that seemed to be related to musculoskeletal pain from her original surgery. She was again discharged from the hospital but since then has had problems with orthostatic hypotension for which she was seen in the emergency department on 09/24/2011. At that time all of her blood pressure medications were put on hold and she has since then done better. She was seen in followup by Dr. Fabio Bering office last week and told to resume taking low-dose Cozaar. Since then she tried taking Cozaar twice but this is only made her dizziness worse so she has again stopped. She now reports that she is no longer having any significant pain other than occasional mild soreness at night when she is resting. She's not taking any sort of pain relievers. She has not had any shortness of breath. Her appetite has been slow to improve but she is eating some. Her weight has continued to trend down since hospital discharge but it seems to have stabilized now. Her blood sugars have been under good control. She continues to have dizziness when she first and is up usually within a few seconds it seems to improve and resolve. She has not had any syncope. She's not had any tachypalpitations.  She is improving after starting on iv iron.  Sees a doctor at the cancer center in [place]  ROS: Denies fever, malais, weight loss, blurry vision, decreased visual acuity, cough, sputum, SOB, hemoptysis, pleuritic pain, palpitaitons, heartburn, abdominal pain, melena, lower extremity edema, claudication, or rash.  All other systems reviewed and negative  General: Affect appropriate Healthy:   appears stated age HEENT: normal Neck supple with no adenopathy JVP normal no bruits no thyromegaly Lungs clear with no wheezing and good diaphragmatic motion Heart:  S1/S2 soft systolic murmur, no rub, gallop or click PMI normal Abdomen: benighn, BS positve, no tenderness, no AAA no bruit.  No HSM or HJR Distal pulses intact with no bruits No edema Neuro non-focal Skin warm and dry No muscular weakness   Current Outpatient Prescriptions  Medication Sig Dispense Refill  . acetaminophen (TYLENOL) 500 MG tablet Take 500 mg by mouth every 6 (six) hours as needed. For pain      . aspirin 325 MG EC tablet Take 325 mg by mouth daily.      . cholecalciferol (VITAMIN D) 1000 UNITS tablet Take 1,000 Units by mouth daily.      . Insulin Human (INSULIN PUMP) 100 unit/ml SOLN Inject into the skin. humalog- basal rate continuously & boluses as appropriate to her carbs & blood glucose      . levothyroxine (SYNTHROID, LEVOTHROID) 112 MCG tablet Take 112 mcg by mouth daily before breakfast.       . magnesium hydroxide (MILK OF MAGNESIA) 400 MG/5ML suspension Take 30 mLs by mouth daily as needed. For upset stomach       No current facility-administered medications for this visit.   Facility-Administered Medications Ordered in Other Visits  Medication Dose Route Frequency Provider Last Rate Last Dose  . albuterol (PROVENTIL) (5 MG/ML) 0.5% nebulizer solution 2.5 mg  2.5 mg Nebulization Once Purcell Nails, MD        Allergies  Statins  Electrocardiogram:  SR LVH   Assessment and Plan

## 2011-12-05 NOTE — Patient Instructions (Signed)
Your physician wants you to follow-up in:   6 MONTHS WITH DR Haywood Filler will receive a reminder letter in the mail two months in advance. If you don't receive a letter, please call our office to schedule the follow-up appointment. Your physician recommends that you continue on your current medications as directed. Please refer to the Current Medication list given to you today.  Your physician has requested that you have an echocardiogram. Echocardiography is a painless test that uses sound waves to create images of your heart. It provides your doctor with information about the size and shape of your heart and how well your heart's chambers and valves are working. This procedure takes approximately one hour. There are no restrictions for this procedure. DX AS

## 2011-12-05 NOTE — Assessment & Plan Note (Signed)
Some of her dizzyness related to low BS Chronic Stable  Discussed low carb diet.  Target hemoglobin A1c is 6.5 or less.  Continue current medications.

## 2011-12-12 ENCOUNTER — Ambulatory Visit (HOSPITAL_COMMUNITY): Payer: Medicare Other | Attending: Cardiology | Admitting: Radiology

## 2011-12-12 DIAGNOSIS — I059 Rheumatic mitral valve disease, unspecified: Secondary | ICD-10-CM | POA: Insufficient documentation

## 2011-12-12 DIAGNOSIS — I369 Nonrheumatic tricuspid valve disorder, unspecified: Secondary | ICD-10-CM | POA: Insufficient documentation

## 2011-12-12 DIAGNOSIS — I2510 Atherosclerotic heart disease of native coronary artery without angina pectoris: Secondary | ICD-10-CM | POA: Insufficient documentation

## 2011-12-12 DIAGNOSIS — J4489 Other specified chronic obstructive pulmonary disease: Secondary | ICD-10-CM | POA: Insufficient documentation

## 2011-12-12 DIAGNOSIS — I350 Nonrheumatic aortic (valve) stenosis: Secondary | ICD-10-CM

## 2011-12-12 DIAGNOSIS — J449 Chronic obstructive pulmonary disease, unspecified: Secondary | ICD-10-CM | POA: Insufficient documentation

## 2011-12-12 DIAGNOSIS — I359 Nonrheumatic aortic valve disorder, unspecified: Secondary | ICD-10-CM

## 2011-12-12 DIAGNOSIS — E119 Type 2 diabetes mellitus without complications: Secondary | ICD-10-CM | POA: Insufficient documentation

## 2011-12-12 NOTE — Progress Notes (Signed)
Echocardiogram performed.  

## 2012-01-13 ENCOUNTER — Ambulatory Visit: Payer: Medicare Other | Admitting: Thoracic Surgery (Cardiothoracic Vascular Surgery)

## 2012-01-17 ENCOUNTER — Ambulatory Visit (INDEPENDENT_AMBULATORY_CARE_PROVIDER_SITE_OTHER): Payer: Medicare Other | Admitting: Thoracic Surgery (Cardiothoracic Vascular Surgery)

## 2012-01-17 ENCOUNTER — Encounter: Payer: Self-pay | Admitting: Thoracic Surgery (Cardiothoracic Vascular Surgery)

## 2012-01-17 VITALS — BP 142/86 | HR 104 | Resp 20 | Ht 67.0 in | Wt 175.0 lb

## 2012-01-17 DIAGNOSIS — Z951 Presence of aortocoronary bypass graft: Secondary | ICD-10-CM

## 2012-01-17 DIAGNOSIS — Z952 Presence of prosthetic heart valve: Secondary | ICD-10-CM

## 2012-01-17 DIAGNOSIS — Z953 Presence of xenogenic heart valve: Secondary | ICD-10-CM

## 2012-01-17 DIAGNOSIS — Z954 Presence of other heart-valve replacement: Secondary | ICD-10-CM

## 2012-01-17 NOTE — Progress Notes (Signed)
[address]            [name] [address]          [phone]     CARDIOTHORACIC SURGERY OFFICE NOTE  Referring Provider is Jaclyn Haws, MD PCP is Jaclyn Matar, MD   HPI:  Patient returns for routine followup status post aortic valve replacement and coronary artery bypass grafting x3 on 09/11/2011. She was last seen here in our office on 10/07/2011. Since then she has continued to do well. She has been followed carefully by Dr. Eden [name] and underwent a followup echocardiogram on 12/12/2011. This demonstrates normal left ventricular size and systolic function with ejection fraction estimated 55-60%. The bioprosthetic tissue valve in the aortic position was functioning normally with no aortic insufficiency and mean gradient of 8 mm mercury. No other significant abnormalities were noted. Clinically the patient reports doing quite well. She states that she still not quite back to 100% of her original baseline but she notes that her breathing and exercise tolerance is much better than it was in the weeks and months leading up to her surgery. She no longer has any significant soreness in her chest. Her energy level and activity is quite good. Overall she has no complaints.   Current Outpatient Prescriptions  Medication Sig Dispense Refill  . aspirin 325 MG EC tablet Take 325 mg by mouth daily.      . cholecalciferol (VITAMIN D) 1000 UNITS tablet Take 1,000 Units by mouth daily.      . Insulin Human (INSULIN PUMP) 100 unit/ml SOLN Inject into the skin. humalog- basal rate continuously & boluses as appropriate to her carbs & blood glucose      . levothyroxine (SYNTHROID, LEVOTHROID) 112 MCG tablet Take 112 mcg by mouth daily before breakfast.       . magnesium hydroxide (MILK OF MAGNESIA) 400 MG/5ML suspension Take 30 mLs by mouth daily as needed. For upset stomach       No current facility-administered medications for this visit.   Facility-Administered  Medications Ordered in Other Visits  Medication Dose Route Frequency Provider Last Rate Last Dose  . albuterol (PROVENTIL) (5 MG/ML) 0.5% nebulizer solution 2.5 mg  2.5 mg Nebulization Once Jaclyn Nails, MD          Physical Exam:   BP 142/86  Pulse 104  Resp 20  Ht 5\' 7"  (1.702 m)  Wt 175 lb (79.379 kg)  BMI 27.41 kg/m2  SpO2 97%  General:  Well-appearing  Chest:   Clear to auscultation with symmetrical breath sounds  CV:   Regular rate and rhythm without murmur  Incisions:  Sternotomy scar has healed nicely and the sternum is stable  Abdomen:  Soft and nontender  Extremities:  Warm and well-perfused  Diagnostic Tests:  Transthoracic Echocardiography  Patient: Jaclyn [name], Jaclyn [name] MR #: [id number] Study Date: 12/12/2011 Gender: F Age: 67 Height: 170.2cm Weight: 77.6kg BSA: 1.49m^2 Pt. Status: Room:  ORDERING Jaclyn [name] SONOGRAPHER Jaclyn [name], RDCS ATTENDING Jaclyn [name] PERFORMING Jaclyn [name], Site [facility] cc: Dr. Robb [name]  ------------------------------------------------------------ LV EF: 55% - 60%  ------------------------------------------------------------ Indications: 424.1 Aortic valve disorders.  ------------------------------------------------------------ History: PMH: Acquired from the patient and from the patient's chart. Dizziness. Coronary artery disease. Aortic valve disease. Chronic obstructive pulmonary disease. Risk factors: Diabetes mellitus. Dyslipidemia.  ------------------------------------------------------------ Study Conclusions  - Left ventricle: The cavity size was normal.  Wall thickness was normal. Systolic function was normal. The estimated ejection fraction was in the range of 55% to 60%. Wall motion was normal; there were no regional wall motion abnormalities. Doppler parameters are consistent with abnormal left ventricular relaxation (grade 1 diastolic dysfunction). - Aortic valve:  Bioprosthetic aortic valve. There was no stenosis. No regurgitation. Mean gradient: 8mm Hg (S). - Mitral valve: Mildly calcified annulus. Mild regurgitation. - Left atrium: The atrium was mildly dilated. - Right ventricle: The cavity size was normal. Systolic function was normal. - Tricuspid valve: Peak RV-RA gradient:28mm Hg (S). - Pulmonary arteries: PA peak pressure: 28mm Hg (S). - Inferior vena cava: The vessel was normal in size; the respirophasic diameter changes were in the normal range (= 50%); findings are consistent with normal central venous pressure. Impressions:  - Normal LV size and systolic function, EF 55-60%. Bioprosthetic aortic valve appears to function normally. Normal RV size and systolic function.  ------------------------------------------------------------ Labs, prior tests, procedures, and surgery: Coronary artery bypass grafting. Aortic valve replacement.  Transthoracic echocardiography. M-mode, complete 2D, spectral Doppler, and color Doppler. Height: Height: 170.2cm. Height: 67in. Weight: Weight: 77.6kg. Weight: 170.6lb. Body mass index: BMI: 26.8kg/m^2. Body surface area: BSA: 1.94m^2. Blood pressure: 155/84. Patient status: Outpatient. Location: [facility]  ------------------------------------------------------------  ------------------------------------------------------------ Left ventricle: The cavity size was normal. Wall thickness was normal. Systolic function was normal. The estimated ejection fraction was in the range of 55% to 60%. Wall motion was normal; there were no regional wall motion abnormalities. Doppler parameters are consistent with abnormal left ventricular relaxation (grade 1 diastolic dysfunction).  ------------------------------------------------------------ Aortic valve: Bioprosthetic aortic valve. Doppler: There was no stenosis. No regurgitation. VTI ratio of LVOT to aortic valve: 0.45. Peak velocity ratio of  LVOT to aortic valve: 0.52. Mean gradient: 8mm Hg (S). Peak gradient: 14mm Hg (S).  ------------------------------------------------------------ Aorta: Aortic root: The aortic root was normal in size. Ascending aorta: The ascending aorta was normal in size.  ------------------------------------------------------------ Mitral valve: Mildly calcified annulus. Doppler: There was no evidence for stenosis. Mild regurgitation. Peak gradient: 3mm Hg (D).  ------------------------------------------------------------ Left atrium: The atrium was mildly dilated.  ------------------------------------------------------------ Right ventricle: The cavity size was normal. Systolic function was normal.  ------------------------------------------------------------ Pulmonic valve: Structurally normal valve. Cusp separation was normal. Doppler: Transvalvular velocity was within the normal range. No regurgitation.  ------------------------------------------------------------ Tricuspid valve: Doppler: Trivial regurgitation.  ------------------------------------------------------------ Right atrium: The atrium was normal in size.  ------------------------------------------------------------ Pericardium: There was no pericardial effusion.  ------------------------------------------------------------ Systemic veins: Inferior vena cava: The vessel was normal in size; the respirophasic diameter changes were in the normal range (= 50%); findings are consistent with normal central venous pressure.  ------------------------------------------------------------  2D measurements Normal Doppler measurements Normal Left ventricle Main pulmonary LVID ED, 41.3 mm 43-52 artery chord, PLAX Pressure, S 28 mm =30 LVID ES, 20.6 mm 23-38 Hg chord, PLAX Left ventricle FS, chord, 50 % >29 Ea, lat ann, 8.6 cm/s ------ PLAX tiss DP 6 LVPW, ED 8.99 mm ------ E/Ea, lat 10. ------ IVS/LVPW 0.99 <1.3 ann, tiss DP  6 ratio, ED Ea, med ann, 4.5 cm/s ------ Ventricular septum tiss DP IVS, ED 8.94 mm ------ E/Ea, med 20. ------ Aorta ann, tiss DP 4 Root diam, 24 mm ------ LVOT ED Peak vel, S 98. cm/s ------ 5 VTI, S 18. cm ------ 7 Aortic valve Peak vel, S 188 cm/s ------ Mean vel, S 133 cm/s ------ VTI, S 41. cm ------ 1 Mean 8 mm ------ gradient, S Hg Peak 14 mm ------  gradient, S Hg VTI ratio 0.4 ------ LVOT/AV 5 Peak vel 0.5 ------ ratio, 2 LVOT/AV Mitral valve Peak E vel 91. cm/s ------ 8 Peak A vel 112 cm/s ------ Deceleration 187 ms 150-23 time 0 Peak 3 mm ------ gradient, D Hg Peak E/A 0.8 ------ ratio Tricuspid valve Peak RV-RA 14 mm ------ gradient, S Hg Systemic veins Estimated CVP 5 mm ------ Hg Right ventricle Sa vel, lat 7.6 cm/s ------ ann, tiss DP 4  ------------------------------------------------------------ Prepared and Electronically Authenticated by  Jaclyn [name] 2013-10-31T18:00:22.450    Impression:  Patient is doing very well status post aortic valve replacement and coronary artery bypass grafting more than 4 months ago.  Her exercise tolerance is quite good and she has no symptoms of congestive heart failure nor coronary artery disease.  Plan:  In the future the patient will call and return to see [place] as needed. All of her questions been addressed.   Salvatore Decent. Cornelius Moras, MD 01/17/2012 11:10 AM

## 2012-01-17 NOTE — Patient Instructions (Signed)
Patient may resume normal activity without any particular restrictions.   Endocarditis is a potentially serious infection of heart valves or inside lining of the heart.  It occurs more commonly in patients with diseased heart valves (such as patient's with aortic or mitral valve disease) and in patients who have undergone heart valve repair or replacement.  Certain surgical and dental procedures may put you at risk, such as dental cleaning, other dental procedures, or any surgery involving the respiratory, urinary, gastrointestinal tract, gallbladder or prostate gland.   To minimize your chances for develooping endocarditis, maintain good oral health and seek prompt medical attention for any infections involving the mouth, teeth, gums, skin or urinary tract.  Always notify your doctor or dentist about your underlying heart valve condition before having any invasive procedures. You will need to take antibiotics before certain procedures.

## 2012-06-12 ENCOUNTER — Encounter: Payer: Self-pay | Admitting: Cardiovascular Disease

## 2012-06-12 ENCOUNTER — Ambulatory Visit (INDEPENDENT_AMBULATORY_CARE_PROVIDER_SITE_OTHER): Payer: Medicare Other | Admitting: Cardiovascular Disease

## 2012-06-12 VITALS — BP 134/75 | HR 88 | Ht 67.0 in | Wt 183.0 lb

## 2012-06-12 DIAGNOSIS — I2510 Atherosclerotic heart disease of native coronary artery without angina pectoris: Secondary | ICD-10-CM

## 2012-06-12 DIAGNOSIS — E119 Type 2 diabetes mellitus without complications: Secondary | ICD-10-CM

## 2012-06-12 DIAGNOSIS — Z952 Presence of prosthetic heart valve: Secondary | ICD-10-CM

## 2012-06-12 DIAGNOSIS — Z954 Presence of other heart-valve replacement: Secondary | ICD-10-CM

## 2012-06-12 NOTE — Patient Instructions (Signed)
Your physician wants you to follow-up in: YEAR WITH DR NISHAN  You will receive a reminder letter in the mail two months in advance. If you don't receive a letter, please call our office to schedule the follow-up appointment.  Your physician recommends that you continue on your current medications as directed. Please refer to the Current Medication list given to you today. 

## 2012-06-12 NOTE — Assessment & Plan Note (Signed)
Improved control since getting new insulin pump 2 months ago F/U primary Target A1c 6.5 or less

## 2012-06-12 NOTE — Assessment & Plan Note (Signed)
Stable with no angina and good activity level.  Continue medical Rx  

## 2012-06-12 NOTE — Assessment & Plan Note (Signed)
Normal on exam and f/u echo. Has upper and lower dentures no SBE not needed

## 2012-06-12 NOTE — Progress Notes (Signed)
Patient ID: Jaclyn [name], female   DOB: Dec 03, 1945, 67 y.o.   MRN: [id number] Patient returns for routine followup status post aortic valve replacement and coronary artery bypass grafting x3 on 09/11/2011.  Since then she has continued to do well. She has   underwent a followup echocardiogram on 12/12/2011. This demonstrates normal left ventricular size and systolic function with ejection fraction estimated 55-60%. The bioprosthetic tissue valve in the aortic position was functioning normally with no aortic insufficiency and mean gradient of 8 mm mercury. No other significant abnormalities were noted. Clinically the patient reports doing quite well.   ROS: Denies fever, malais, weight loss, blurry vision, decreased visual acuity, cough, sputum, SOB, hemoptysis, pleuritic pain, palpitaitons, heartburn, abdominal pain, melena, lower extremity edema, claudication, or rash.  All other systems reviewed and negative  General: Affect appropriate Healthy:  appears stated age HEENT: normal Neck supple with no adenopathy JVP normal no bruits no thyromegaly Lungs clear with no wheezing and good diaphragmatic motion Heart:  S1/S2 SEM murmur, no rub, gallop or click PMI normal Abdomen: benighn, BS positve, no tenderness, no AAA no bruit.  No HSM or HJR Distal pulses intact with no bruits No edema Neuro non-focal Skin warm and dry No muscular weakness   Current Outpatient Prescriptions  Medication Sig Dispense Refill  . aspirin 325 MG EC tablet Take 325 mg by mouth daily.      . cholecalciferol (VITAMIN D) 1000 UNITS tablet Take 1,000 Units by mouth daily.      Marland Kitchen HUMALOG 100 UNIT/ML injection as directed.      . Insulin Human (INSULIN PUMP) 100 unit/ml SOLN Inject into the skin. humalog- basal rate continuously & boluses as appropriate to her carbs & blood glucose      . levothyroxine (SYNTHROID, LEVOTHROID) 112 MCG tablet Take 112 mcg by mouth daily before breakfast.       . magnesium hydroxide  (MILK OF MAGNESIA) 400 MG/5ML suspension Take 30 mLs by mouth daily as needed. For upset stomach       No current facility-administered medications for this visit.   Facility-Administered Medications Ordered in Other Visits  Medication Dose Route Frequency Provider Last Rate Last Dose  . albuterol (PROVENTIL) (5 MG/ML) 0.5% nebulizer solution 2.5 mg  2.5 mg Nebulization Once Purcell Nails, MD        Allergies  Statins  Electrocardiogram:  09/29/12 SR rate 95 RAE otherwise normal   Assessment and Plan

## 2012-11-19 IMAGING — CR DG CHEST 2V
2 series · 2 of 2 positions shown · non-contrast
Comparison: 09/18/2011; 09/16/2011; 09/14/2011; chest CT -
08/27/2011

CLINICAL DATA: Short of breath, weakness, nausea, recent CABG

CHEST - 2 VIEW

[w chest pa]
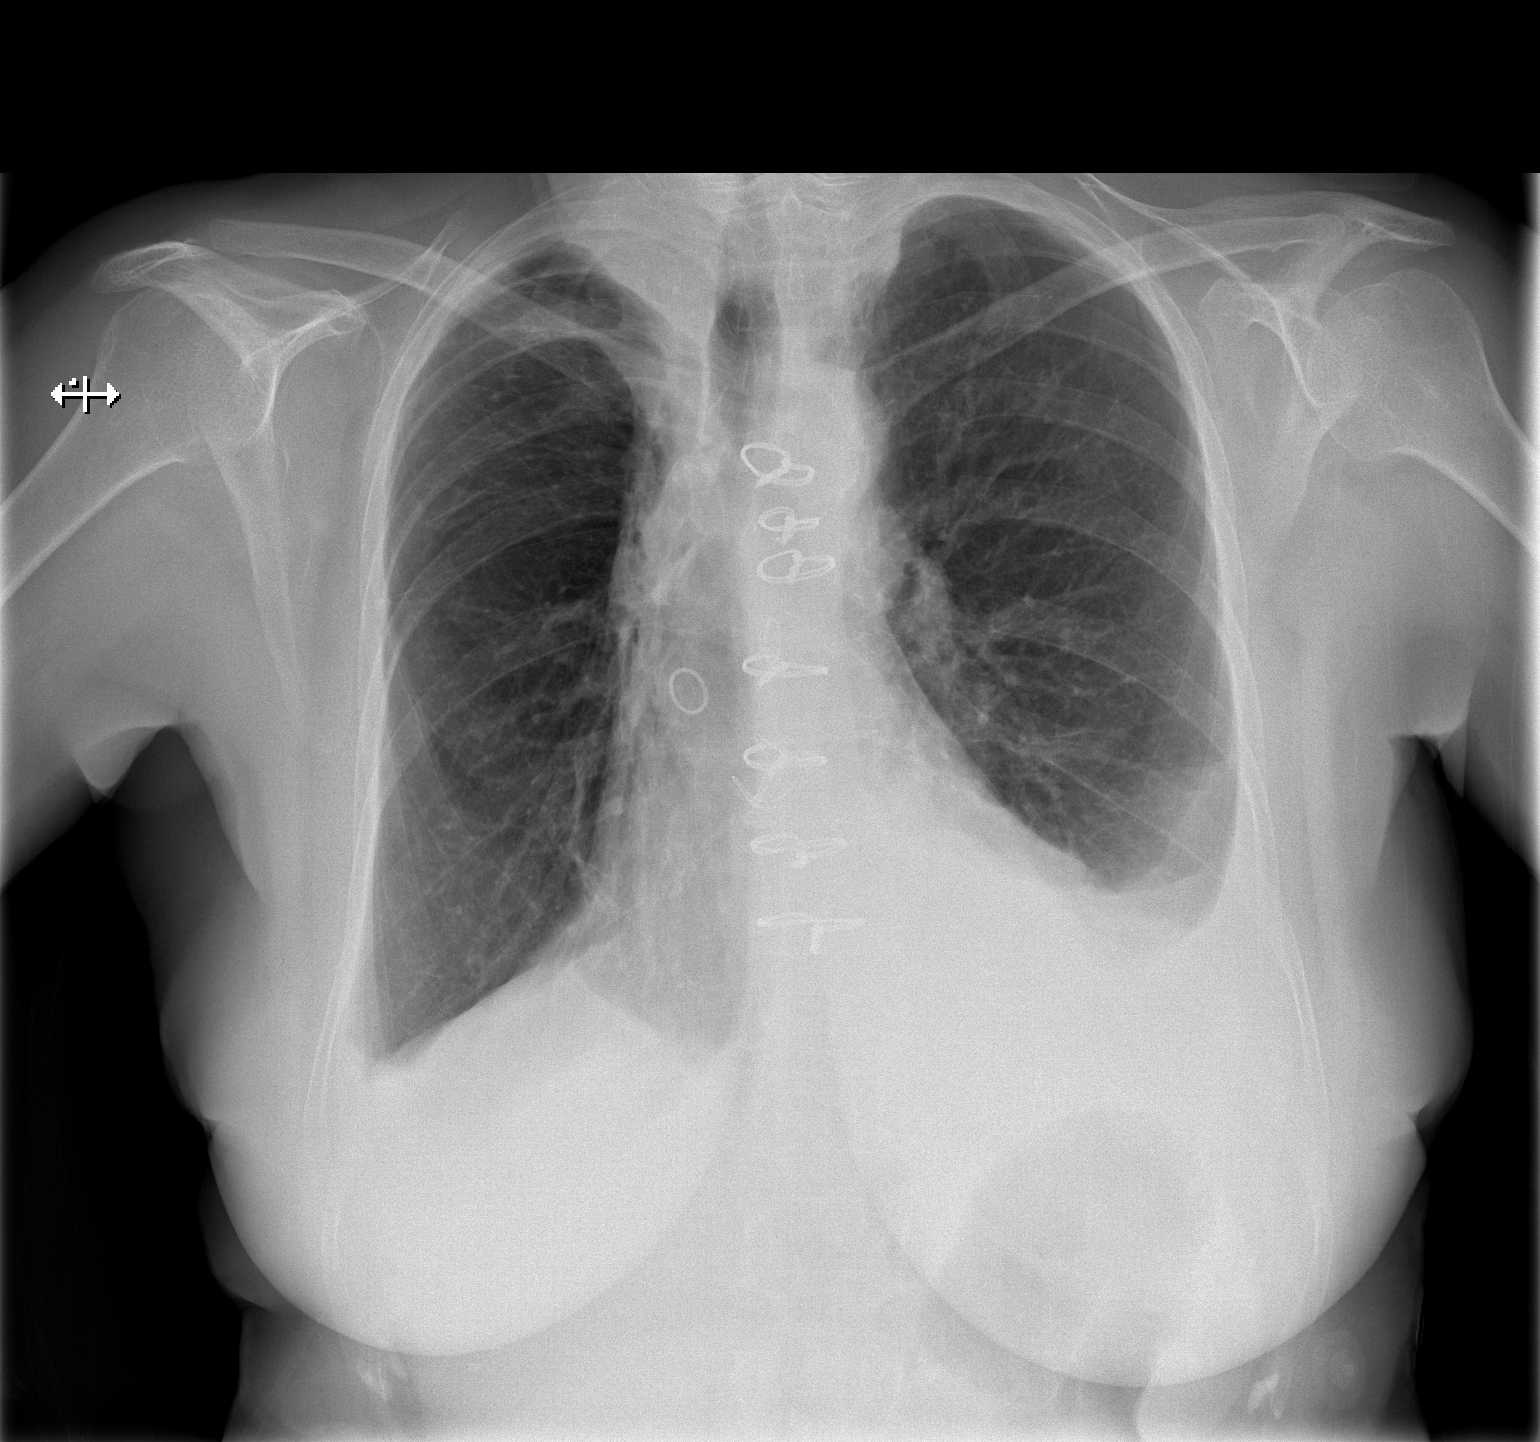

[w chest lat]
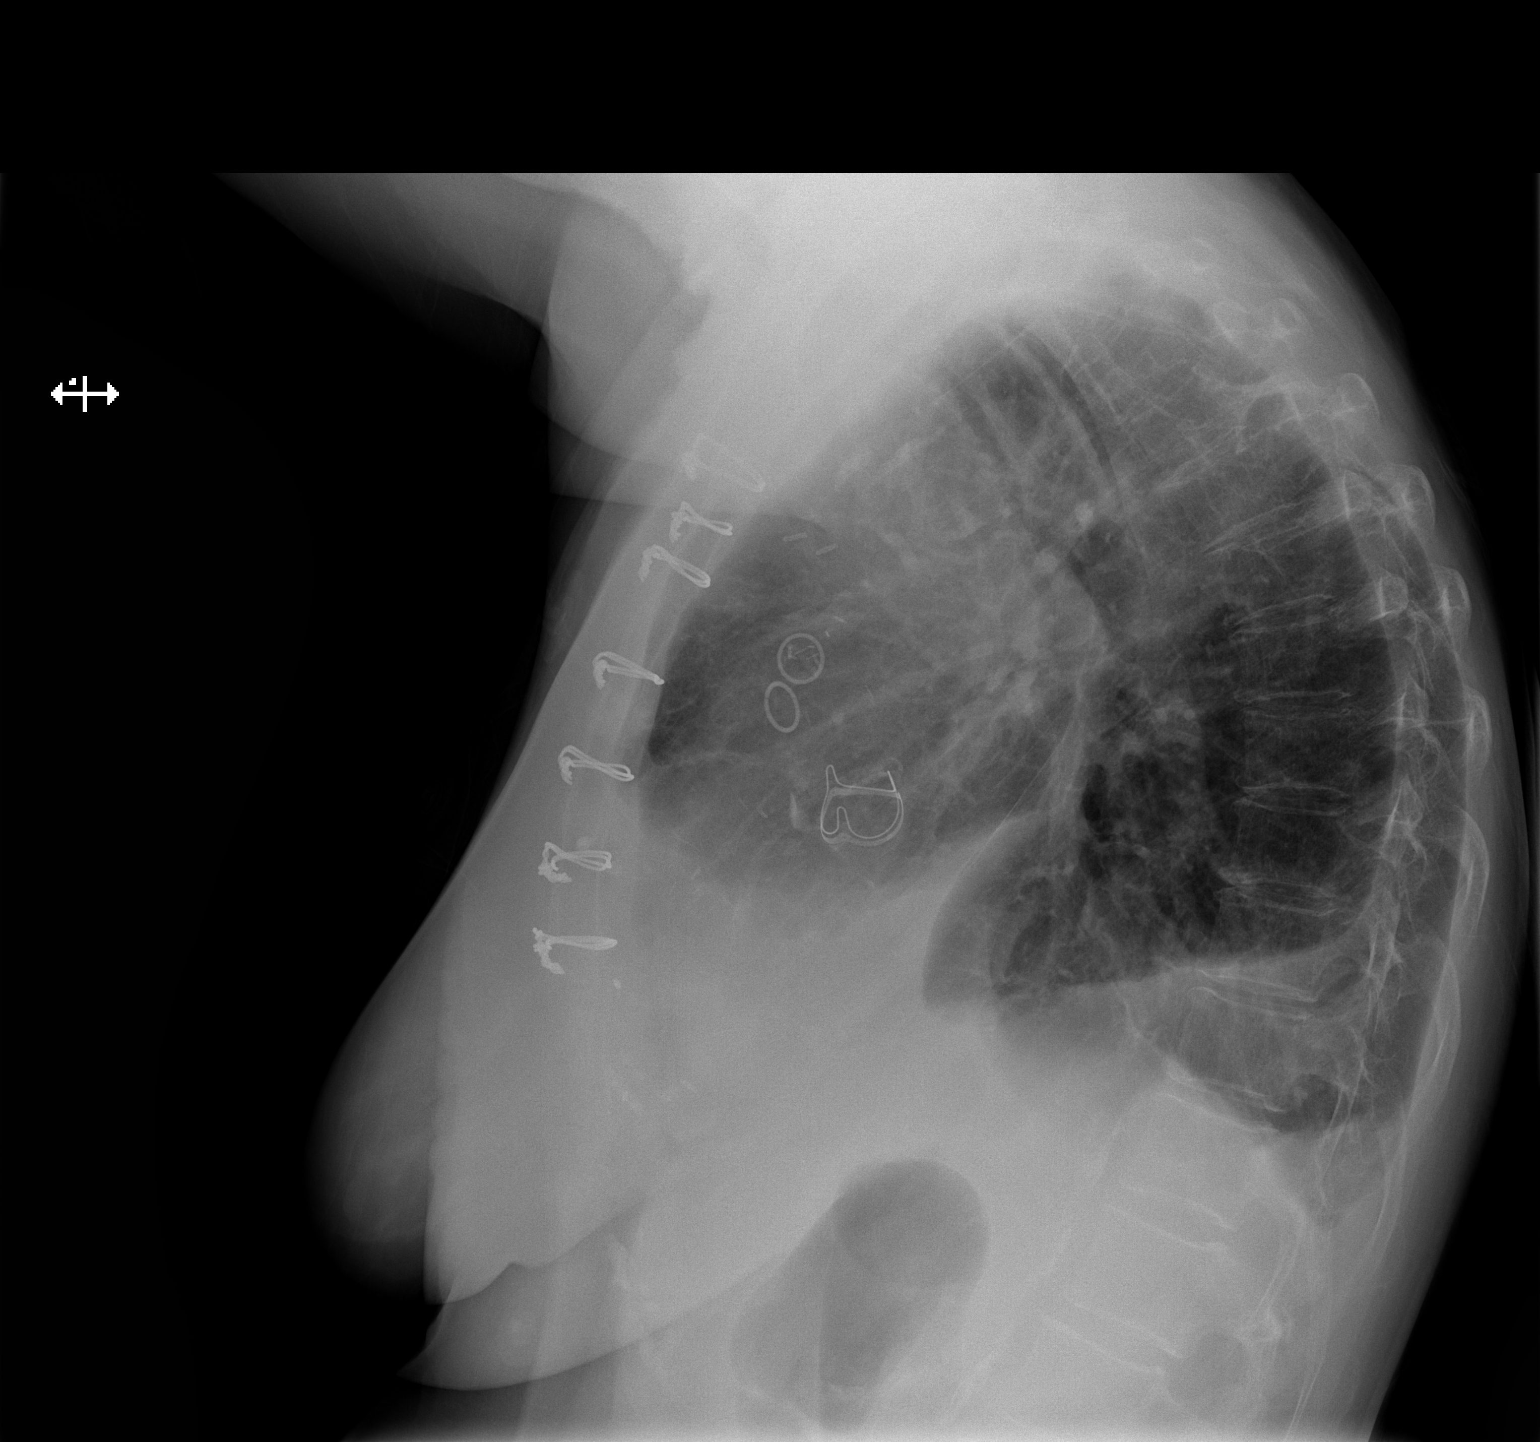

[2 of 2 positions shown; findings below may reference images not displayed]

FINDINGS: Grossly unchanged cardiac silhouette and mediastinal
contours post median sternotomy, CABG and aortic valve repair.
Stable radiation change about the right hilum with associated
medial right apical pleuroparenchymal thickening.  Bilateral small
pleural effusions grossly unchanged, left mid than right.  Grossly
unchanged bibasilar heterogeneous opacities.  The lungs remain
hyperinflated with mild diffuse thickening of the pulmonary
interstitium.  No pneumothorax.  Grossly unchanged bones including
accentuated thoracic kyphosis.  Vascular calcifications within the
upper abdomen.
IMPRESSION: 1.  Grossly unchanged small bilateral pleural effusions and
bibasilar opacities, left greater than right, atelectasis versus
infiltrate.
2.  Stable emphysematous and radiation change of the chest.

## 2012-11-22 IMAGING — CR DG CHEST 1V PORT
1 series · 1 of 1 positions shown · non-contrast
Comparison: PA and lateral chest 09/21/2011.

CLINICAL DATA: Hypotension.  Status post cardiac surgery
09/11/2011.

PORTABLE CHEST - 1 VIEW

[AP]
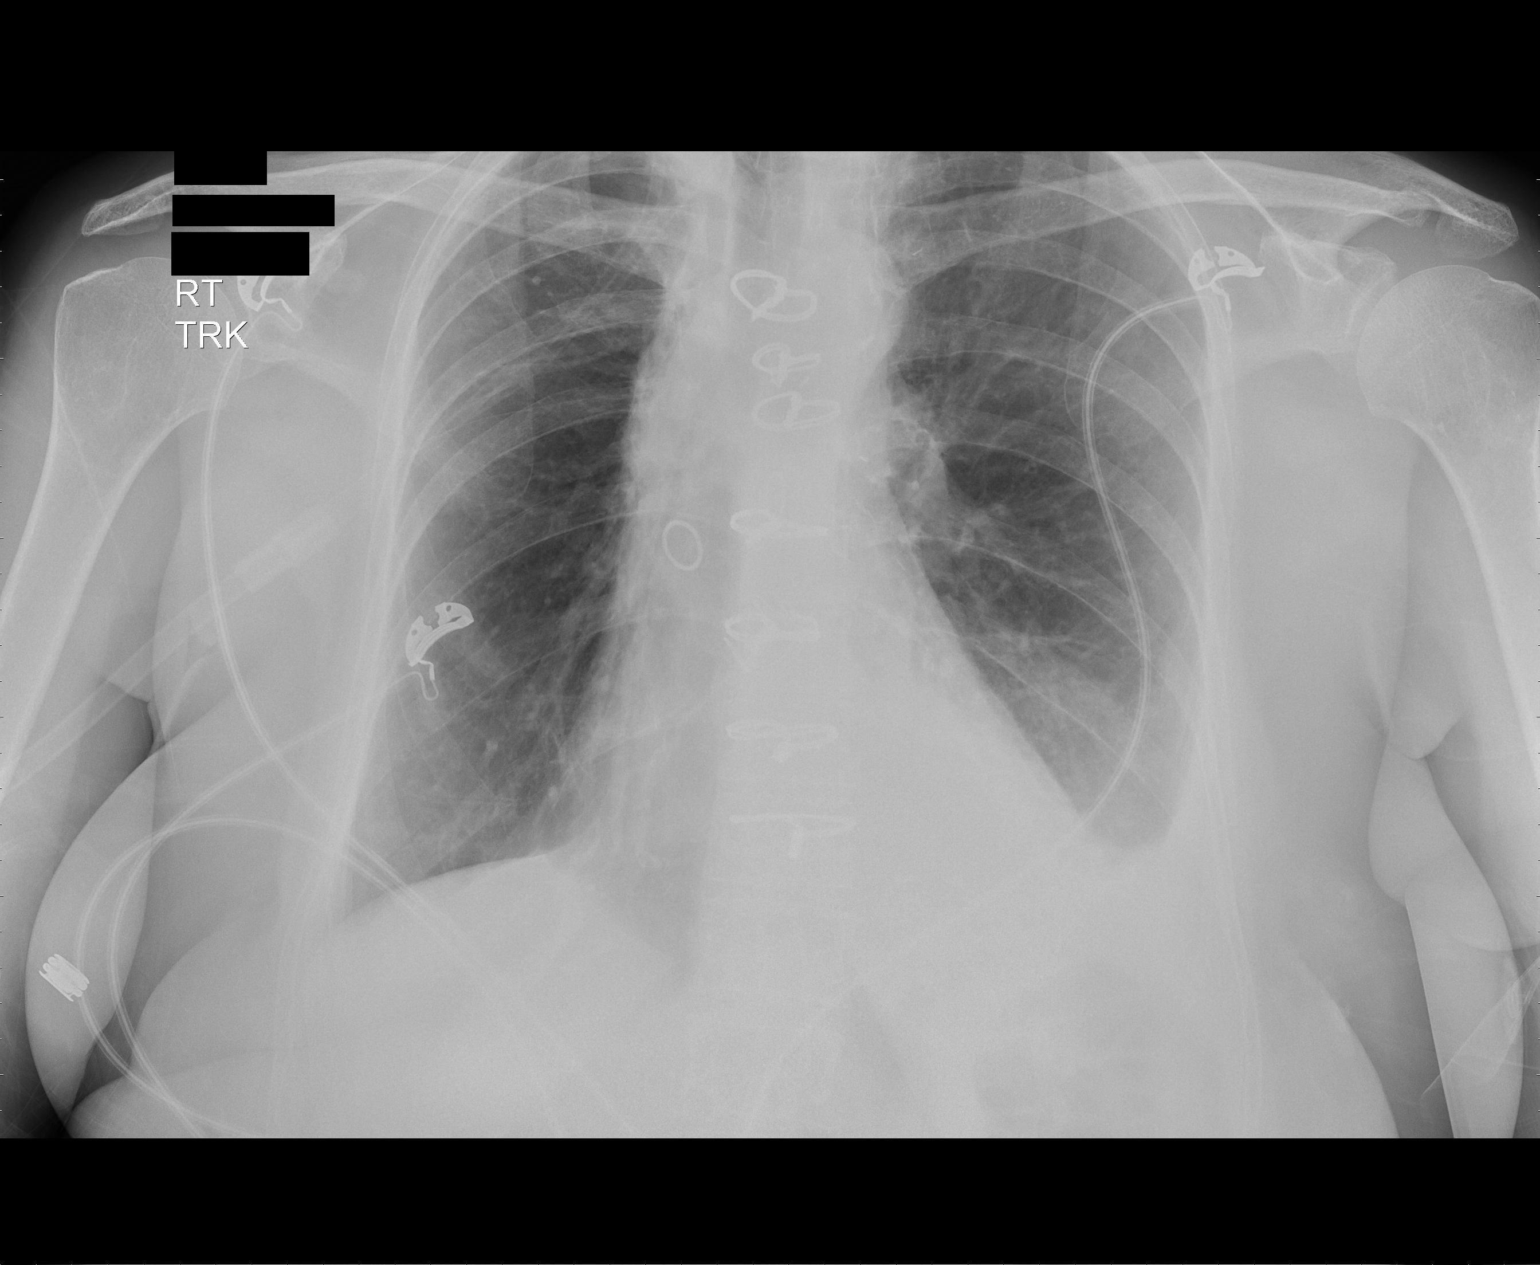

[1 of 1 positions shown; findings below may reference images not displayed]

FINDINGS: Small bilateral pleural effusions and basilar
atelectasis, greater on the left, have decreased.  There is no
pulmonary edema.  Heart size is upper normal.  No pneumothorax.
IMPRESSION: Decreased small bilateral pleural effusions and basilar
atelectasis, greater on the left.  No new abnormality.

## 2012-12-05 IMAGING — CR DG CHEST 2V
2 series · 2 of 2 positions shown · non-contrast
Comparison: 09/24/2011

CLINICAL DATA: Post CABG, 3-week follow up appointment; past
history lung cancer post radiation therapy, coronary artery disease
post CABG, hypertension, CHF, GERD, aortic stenosis post AVR

CHEST - 2 VIEW

[view not recorded (1 of 2)]
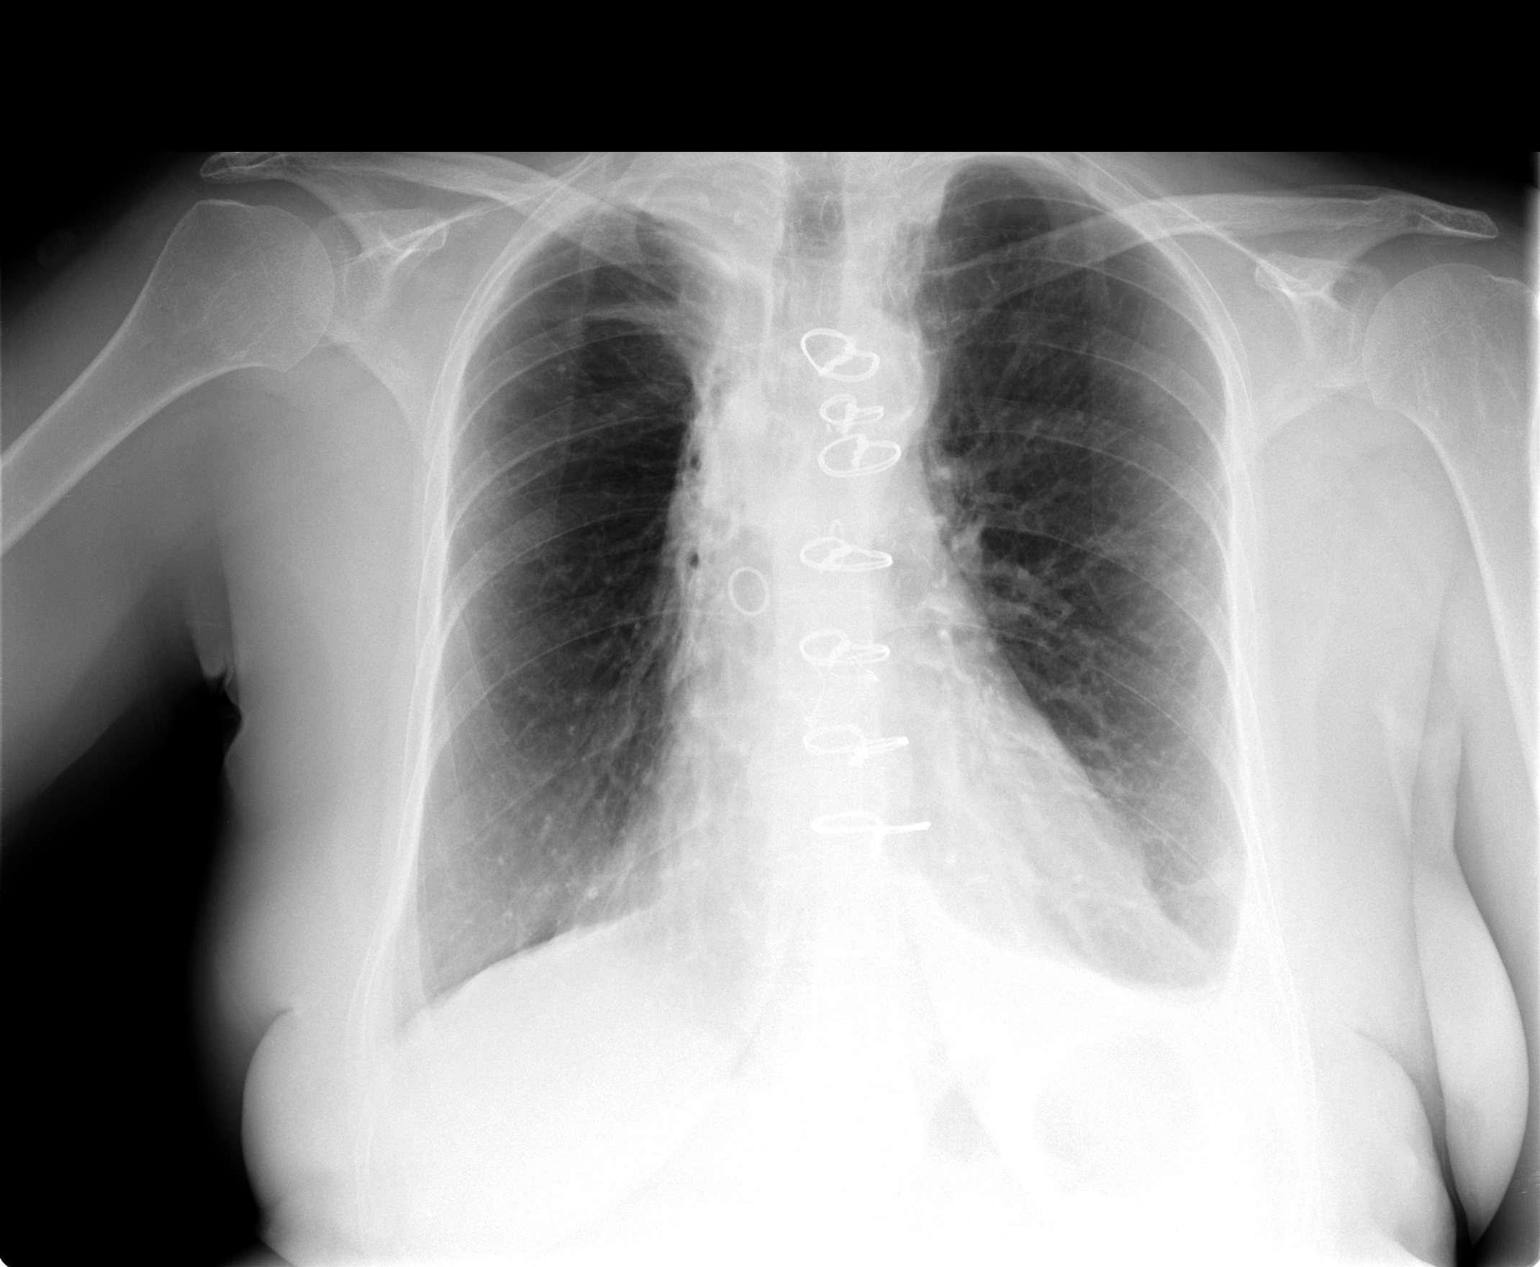

[view not recorded (2 of 2)]
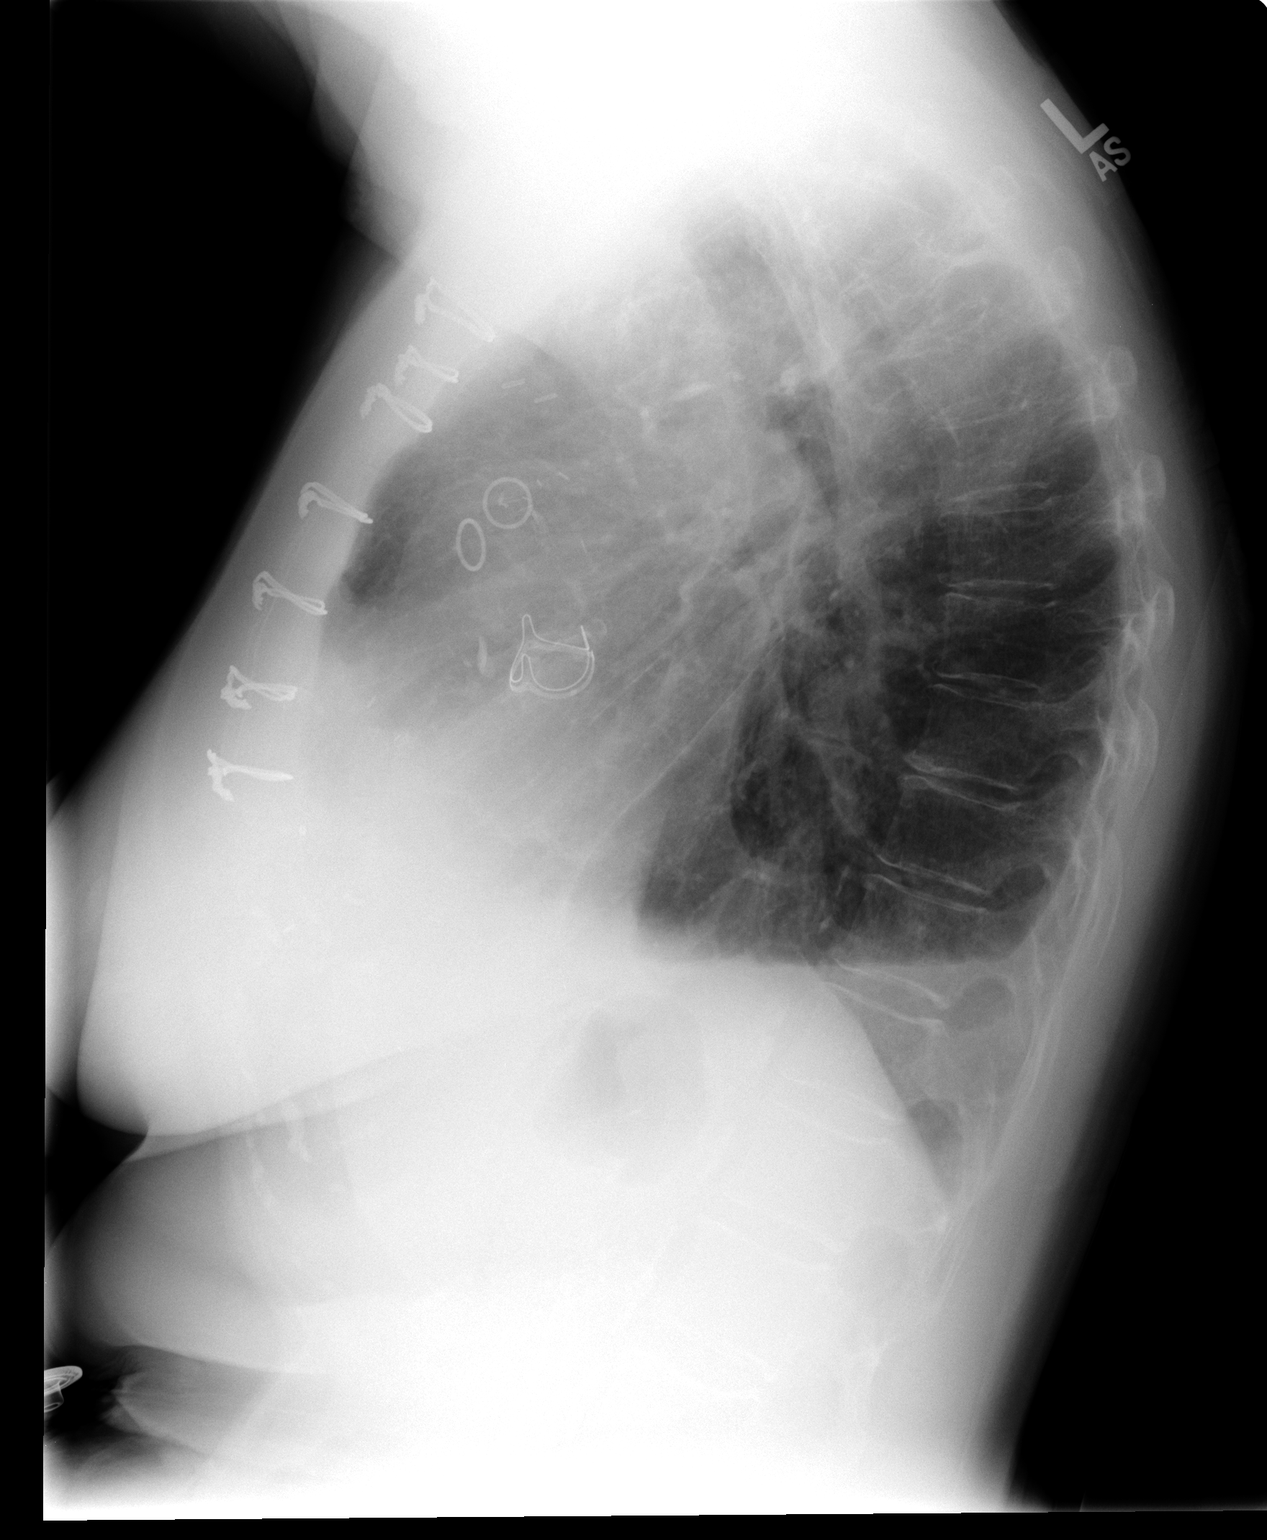

[2 of 2 positions shown; findings below may reference images not displayed]

FINDINGS: Upper normal heart size post CABG and AVR.
Atherosclerotic calcification aorta.
Pulmonary vascularity normal.
Elevation of right diaphragm secondary to right upper lobe volume
loss and scarring, unchanged from preoperative appearance, likely
related to history lung cancer and radiation therapy.
Small left pleural effusion and left basilar atelectasis.
Underlying emphysematous changes.
No pneumothorax.
Tiny calcified granuloma right upper right chest.
Remaining lungs clear.
Bones demineralized.
IMPRESSION: Postsurgical changes of CABG and AVR.
COPD with right upper lobe scarring likely related to history of
lung cancer and radiation therapy.
Small persistent left pleural effusion and basilar atelectasis.

## 2013-06-17 ENCOUNTER — Encounter (INDEPENDENT_AMBULATORY_CARE_PROVIDER_SITE_OTHER): Payer: Self-pay

## 2013-06-17 ENCOUNTER — Encounter: Payer: Self-pay | Admitting: Cardiovascular Disease

## 2013-06-17 ENCOUNTER — Ambulatory Visit (INDEPENDENT_AMBULATORY_CARE_PROVIDER_SITE_OTHER): Payer: Medicare Other | Admitting: Cardiovascular Disease

## 2013-06-17 VITALS — BP 144/68 | HR 84 | Ht 67.0 in | Wt 185.0 lb

## 2013-06-17 DIAGNOSIS — E785 Hyperlipidemia, unspecified: Secondary | ICD-10-CM

## 2013-06-17 DIAGNOSIS — R0989 Other specified symptoms and signs involving the circulatory and respiratory systems: Secondary | ICD-10-CM

## 2013-06-17 DIAGNOSIS — I2510 Atherosclerotic heart disease of native coronary artery without angina pectoris: Secondary | ICD-10-CM

## 2013-06-17 NOTE — Assessment & Plan Note (Signed)
Severe myalgias with multiple statins  F/U lipid clinic may be candidate for [facility]

## 2013-06-17 NOTE — Assessment & Plan Note (Signed)
21 mm [facility] Ease pericardial valve  Soft SEM no AR  Stable Echo for new symptoms or change in murmur

## 2013-06-17 NOTE — Progress Notes (Signed)
Patient ID: Jaclyn [name], female   DOB: 09-Oct-1945, 68 y.o.   MRN: [id number] Patient returns for routine followup status post aortic valve replacement and coronary artery bypass grafting x3 on 09/11/2011. Since then she has continued to do well. She has underwent a followup echocardiogram on 12/12/2011. This demonstrates normal left ventricular size and systolic function with ejection fraction estimated 55-60%. The bioprosthetic tissue valve in the aortic position was functioning normally with no aortic insufficiency and mean gradient of 8 mm mercury. No other significant abnormalities were noted. Clinically the patient reports doing quite well.       ROS: Denies fever, malais, weight loss, blurry vision, decreased visual acuity, cough, sputum, SOB, hemoptysis, pleuritic pain, palpitaitons, heartburn, abdominal pain, melena, lower extremity edema, claudication, or rash.  All other systems reviewed and negative  General: Affect appropriate Healthy:  appears stated age 42: normal Neck supple with no adenopathy JVP normal bilateral  bruits no thyromegaly Lungs clear with no wheezing and good diaphragmatic motion Heart:  S1/S2 SEM  murmur, no rub, gallop or click PMI normal Abdomen: benighn, BS positve, no tenderness, no AAA no bruit.  No HSM or HJR Distal pulses intact with no bruits No edema Neuro non-focal Skin warm and dry No muscular weakness   Current Outpatient Prescriptions  Medication Sig Dispense Refill  . aspirin 325 MG EC tablet Take 325 mg by mouth daily.      . cholecalciferol (VITAMIN D) 1000 UNITS tablet Take 1,000 Units by mouth daily.      Marland Kitchen HUMALOG 100 UNIT/ML injection as directed.      . Insulin Human (INSULIN PUMP) 100 unit/ml SOLN Inject into the skin. humalog- basal rate continuously & boluses as appropriate to her carbs & blood glucose      . levothyroxine (SYNTHROID, LEVOTHROID) 112 MCG tablet Take 112 mcg by mouth daily before breakfast.       .  magnesium hydroxide (MILK OF MAGNESIA) 400 MG/5ML suspension Take 30 mLs by mouth daily as needed. For upset stomach       No current facility-administered medications for this visit.   Facility-Administered Medications Ordered in Other Visits  Medication Dose Route Frequency Provider Last Rate Last Dose  . albuterol (PROVENTIL) (5 MG/ML) 0.5% nebulizer solution 2.5 mg  2.5 mg Nebulization Once Rexene Alberts, MD        Allergies  Statins  Electrocardiogram:  SR rate 84 normal   Assessment and Plan

## 2013-06-17 NOTE — Assessment & Plan Note (Signed)
Stable with no angina and good activity level.  Continue medical Rx  

## 2013-06-17 NOTE — Patient Instructions (Signed)
Your physician wants you to follow-up in:   [place] will receive a reminder letter in the mail two months in advance. If you don't receive a letter, please call our office to schedule the follow-up appointment. Your physician recommends that you continue on your current medications as directed. Please refer to the Current Medication list given to you today.  Your physician has requested that you have a carotid duplex. This test is an ultrasound of the carotid arteries in your neck. It looks at blood flow through these arteries that supply the brain with blood. Allow one hour for this exam. There are no restrictions or special instructions. You have been referred to   LIPID CLINIC   ?  [place]

## 2013-06-17 NOTE — Assessment & Plan Note (Signed)
Pre CABG dopples with plaque no significant stenosis F/U duplex ASA

## 2013-06-24 ENCOUNTER — Encounter (HOSPITAL_COMMUNITY): Payer: Self-pay

## 2013-06-24 ENCOUNTER — Ambulatory Visit: Payer: Self-pay | Admitting: Pharmacist

## 2013-06-29 ENCOUNTER — Encounter (HOSPITAL_COMMUNITY): Payer: Self-pay

## 2013-06-29 ENCOUNTER — Ambulatory Visit: Payer: Self-pay | Admitting: Pharmacist

## 2013-07-02 ENCOUNTER — Ambulatory Visit (HOSPITAL_COMMUNITY): Payer: Medicare Other | Attending: Cardiology | Admitting: Cardiology

## 2013-07-02 DIAGNOSIS — R0989 Other specified symptoms and signs involving the circulatory and respiratory systems: Secondary | ICD-10-CM

## 2013-07-02 DIAGNOSIS — I6529 Occlusion and stenosis of unspecified carotid artery: Secondary | ICD-10-CM | POA: Insufficient documentation

## 2013-07-02 NOTE — Progress Notes (Signed)
Carotid duplex performed 

## 2013-07-08 ENCOUNTER — Telehealth: Payer: Self-pay | Admitting: Cardiovascular Disease

## 2013-07-08 NOTE — Telephone Encounter (Signed)
Patient would like test results. Please call and advise.  °

## 2013-07-08 NOTE — Telephone Encounter (Signed)
Notified of carotid doppler results. 

## 2014-06-21 NOTE — Progress Notes (Signed)
Patient ID: Jaclyn [name], female   DOB: [birth year], 69 y.o.   MRN: [id number] 69 y.o. female returns for routine followup status post aortic valve replacement and coronary artery bypass grafting x3 on 09/11/2011. Since then she has continued to do well. She has underwent a followup echocardiogram on 12/12/2011. This demonstrates normal left ventricular size and systolic function with ejection fraction estimated 55-60%. The bioprosthetic tissue valve in the aortic position was functioning normally with no aortic insufficiency and mean gradient of 8 mm mercury. No other significant abnormalities were noted. Clinically the patient reports doing quite well.   Carotid Duplex 09/16/90 92-44% LICA stenosis  ROS: Denies fever, malais, weight loss, blurry vision, decreased visual acuity, cough, sputum, SOB, hemoptysis, pleuritic pain, palpitaitons, heartburn, abdominal pain, melena, lower extremity edema, claudication, or rash.  All other systems reviewed and negative  General: Affect appropriate Healthy:  appears stated age 30: normal Neck supple with no adenopathy JVP normal bilateral  Left bruits no thyromegaly Lungs clear with no wheezing and good diaphragmatic motion Heart:  S1/S2 SEM  murmur, no rub, gallop or click PMI normal Abdomen: benighn, BS positve, no tenderness, no AAA no bruit.  No HSM or HJR Distal pulses intact with no bruits No edema Neuro non-focal Skin warm and dry No muscular weakness   Current Outpatient Prescriptions  Medication Sig Dispense Refill  . aspirin 325 MG EC tablet Take 325 mg by mouth daily.    . cholecalciferol (VITAMIN D) 1000 UNITS tablet Take 1,000 Units by mouth daily.    Marland Kitchen HUMALOG 100 UNIT/ML injection as directed.    . Insulin Human (INSULIN PUMP) 100 unit/ml SOLN Inject into the skin. humalog- basal rate continuously & boluses as appropriate to her carbs & blood glucose    . levothyroxine (SYNTHROID, LEVOTHROID) 112 MCG tablet Take 112 mcg by  mouth daily before breakfast.     . magnesium hydroxide (MILK OF MAGNESIA) 400 MG/5ML suspension Take 30 mLs by mouth daily as needed. For upset stomach     No current facility-administered medications for this visit.   Facility-Administered Medications Ordered in Other Visits  Medication Dose Route Frequency Provider Last Rate Last Dose  . albuterol (PROVENTIL) (5 MG/ML) 0.5% nebulizer solution 2.5 mg  2.5 mg Nebulization Once Rexene Alberts, MD        Allergies  Statins  Electrocardiogram:   06/17/13  SR rate 84 normal   Assessment and Plan

## 2014-06-22 ENCOUNTER — Encounter: Payer: Self-pay | Admitting: Cardiovascular Disease

## 2014-09-07 ENCOUNTER — Ambulatory Visit: Payer: Medicare Other | Admitting: Cardiovascular Disease

## 2014-10-17 NOTE — Progress Notes (Signed)
Patient ID: Jaclyn [name], female   DOB: 08-01-1945, 69 y.o.   MRN: [id number] Patient returns for routine followup status post aortic valve replacement and coronary artery bypass grafting x3 on 09/11/2011. Since then she has continued to do well. She has underwent a followup echocardiogram on 12/12/2011. This demonstrates normal left ventricular size and systolic function with ejection fraction estimated 55-60%. The bioprosthetic tissue valve in the aortic position was functioning normally with no aortic insufficiency and mean gradient of 8 mm mercury. No other significant abnormalities were noted. Clinically the patient reports doing quite well.    07/06/13 Carotid duplex with 40-59% RICA stenosis   Has not been taking beta blocker or ARB    ROS: Denies fever, malais, weight loss, blurry vision, decreased visual acuity, cough, sputum, SOB, hemoptysis, pleuritic pain, palpitaitons, heartburn, abdominal pain, melena, lower extremity edema, claudication, or rash.  All other systems reviewed and negative  General: Affect appropriate Healthy:  appears stated age 69: normal Neck supple with no adenopathy JVP normal bilateral  bruits no thyromegaly Lungs clear with no wheezing and good diaphragmatic motion Heart:  S1/S2 SEM  murmur, no rub, gallop or click PMI normal Abdomen: benighn, BS positve, no tenderness, no AAA no bruit.  No HSM or HJR Distal pulses intact with no bruits No edema Neuro non-focal Skin warm and dry No muscular weakness   Current Outpatient Prescriptions  Medication Sig Dispense Refill  . aspirin 325 MG EC tablet Take 325 mg by mouth daily.    Marland Kitchen HUMALOG 100 UNIT/ML injection Units are injection &  determined by a sliding scale, delivered by a insulin pump.    . Insulin Human (INSULIN PUMP) 100 unit/ml SOLN Inject into the skin. humalog- basal rate continuously & boluses as appropriate to her carbs & blood glucose    . levothyroxine (SYNTHROID, LEVOTHROID) 112  MCG tablet Take 112 mcg by mouth daily before breakfast.     . magnesium hydroxide (MILK OF MAGNESIA) 400 MG/5ML suspension Take 30 mLs by mouth daily as needed. For upset stomach     No current facility-administered medications for this visit.   Facility-Administered Medications Ordered in Other Visits  Medication Dose Route Frequency Provider Last Rate Last Dose  . albuterol (PROVENTIL) (5 MG/ML) 0.5% nebulizer solution 2.5 mg  2.5 mg Nebulization Once Jaclyn Alberts, MD        Allergies  Statins  Electrocardiogram:  06/17/13  SR rate 84 normal  10/19/14  SR rate 80 normal   Assessment and Plan AVR:  SBE prophylaxis no change in murmur last echo 3 years ago will repeat to assess gradients and EF   Carotid:  40-59% RICA overdue for duplex will order ASA Thyroid:  On replacement  Labs with primary DM:  Insulin pump f/u endocrine  A1c in 6 range  HTN:  Called in lisinopril 5 mg for BP and renal protection with DM  Jaclyn [name]

## 2014-10-18 ENCOUNTER — Encounter: Payer: Self-pay | Admitting: Cardiovascular Disease

## 2014-10-19 ENCOUNTER — Ambulatory Visit (INDEPENDENT_AMBULATORY_CARE_PROVIDER_SITE_OTHER): Payer: Medicare Other | Admitting: Cardiovascular Disease

## 2014-10-19 ENCOUNTER — Encounter: Payer: Self-pay | Admitting: Cardiovascular Disease

## 2014-10-19 VITALS — BP 140/68 | HR 80 | Ht 67.0 in | Wt 186.0 lb

## 2014-10-19 DIAGNOSIS — R0989 Other specified symptoms and signs involving the circulatory and respiratory systems: Secondary | ICD-10-CM | POA: Diagnosis not present

## 2014-10-19 DIAGNOSIS — Z954 Presence of other heart-valve replacement: Secondary | ICD-10-CM | POA: Diagnosis not present

## 2014-10-19 DIAGNOSIS — Z952 Presence of prosthetic heart valve: Secondary | ICD-10-CM

## 2014-10-19 MED ORDER — LISINOPRIL 5 MG PO TABS: 5.0000 mg | ORAL_TABLET | Freq: Every day | ORAL | Status: DC

## 2014-10-19 NOTE — Patient Instructions (Signed)
Medication Instructions:  START LISINOPRIL   5 MG  EVERY DAY   Labwork: NONE Testing/Procedures: Your physician has requested that you have an echocardiogram. Echocardiography is a painless test that uses sound waves to create images of your heart. It provides your doctor with information about the size and shape of your heart and how well your heart's chambers and valves are working. This procedure takes approximately one hour. There are no restrictions for this procedure. Your physician has requested that you have a carotid duplex. This test is an ultrasound of the carotid arteries in your neck. It looks at blood flow through these arteries that supply the brain with blood. Allow one hour for this exam. There are no restrictions or special instructions.   Follow-Up: Your physician wants you to follow-up in: [place] will receive a reminder letter in the mail two months in advance. If you don't receive a letter, please call our office to schedule the follow-up appointment. Any Other Special Instructions Will Be Listed Below (If Applicable).

## 2014-11-14 ENCOUNTER — Ambulatory Visit (HOSPITAL_COMMUNITY): Payer: Medicare Other

## 2014-11-14 ENCOUNTER — Ambulatory Visit (HOSPITAL_COMMUNITY)
Admission: RE | Admit: 2014-11-14 | Discharge: 2014-11-14 | Disposition: A | Discharge status: Home / Self Care | Payer: Medicare Other | Source: Ambulatory Visit | Attending: Cardiology | Admitting: Cardiology

## 2014-11-14 DIAGNOSIS — I10 Essential (primary) hypertension: Secondary | ICD-10-CM | POA: Diagnosis not present

## 2014-11-14 DIAGNOSIS — R0989 Other specified symptoms and signs involving the circulatory and respiratory systems: Secondary | ICD-10-CM | POA: Insufficient documentation

## 2014-11-14 DIAGNOSIS — I6523 Occlusion and stenosis of bilateral carotid arteries: Secondary | ICD-10-CM | POA: Diagnosis not present

## 2014-11-14 DIAGNOSIS — F17200 Nicotine dependence, unspecified, uncomplicated: Secondary | ICD-10-CM | POA: Insufficient documentation

## 2014-11-14 DIAGNOSIS — E119 Type 2 diabetes mellitus without complications: Secondary | ICD-10-CM | POA: Insufficient documentation

## 2014-11-14 DIAGNOSIS — E785 Hyperlipidemia, unspecified: Secondary | ICD-10-CM | POA: Diagnosis not present

## 2014-11-22 ENCOUNTER — Other Ambulatory Visit: Payer: Self-pay

## 2014-11-22 ENCOUNTER — Ambulatory Visit (HOSPITAL_COMMUNITY): Payer: Medicare Other | Attending: Cardiovascular Disease

## 2014-11-22 DIAGNOSIS — I340 Nonrheumatic mitral (valve) insufficiency: Secondary | ICD-10-CM | POA: Insufficient documentation

## 2014-11-22 DIAGNOSIS — Z954 Presence of other heart-valve replacement: Secondary | ICD-10-CM | POA: Diagnosis not present

## 2014-11-22 DIAGNOSIS — I517 Cardiomegaly: Secondary | ICD-10-CM | POA: Diagnosis not present

## 2015-08-07 DIAGNOSIS — E1022 Type 1 diabetes mellitus with diabetic chronic kidney disease: Secondary | ICD-10-CM | POA: Diagnosis not present

## 2015-08-07 DIAGNOSIS — E039 Hypothyroidism, unspecified: Secondary | ICD-10-CM | POA: Diagnosis not present

## 2015-08-07 DIAGNOSIS — I10 Essential (primary) hypertension: Secondary | ICD-10-CM

## 2015-08-07 DIAGNOSIS — E559 Vitamin D deficiency, unspecified: Secondary | ICD-10-CM | POA: Diagnosis not present

## 2015-08-07 DIAGNOSIS — N183 Chronic kidney disease, stage 3 (moderate): Secondary | ICD-10-CM | POA: Diagnosis not present

## 2015-08-07 DIAGNOSIS — E1042 Type 1 diabetes mellitus with diabetic polyneuropathy: Secondary | ICD-10-CM | POA: Insufficient documentation

## 2015-08-07 HISTORY — DX: Essential (primary) hypertension: I10

## 2015-08-07 HISTORY — DX: Hypercalcemia: E83.52

## 2015-08-07 HISTORY — DX: Type 1 diabetes mellitus with diabetic polyneuropathy: E10.42

## 2015-08-08 DIAGNOSIS — E109 Type 1 diabetes mellitus without complications: Secondary | ICD-10-CM | POA: Diagnosis not present

## 2015-08-08 DIAGNOSIS — H2513 Age-related nuclear cataract, bilateral: Secondary | ICD-10-CM | POA: Diagnosis not present

## 2015-09-12 DIAGNOSIS — E669 Obesity, unspecified: Secondary | ICD-10-CM | POA: Diagnosis not present

## 2015-09-12 DIAGNOSIS — Z9181 History of falling: Secondary | ICD-10-CM | POA: Diagnosis not present

## 2015-09-12 DIAGNOSIS — I129 Hypertensive chronic kidney disease with stage 1 through stage 4 chronic kidney disease, or unspecified chronic kidney disease: Secondary | ICD-10-CM | POA: Diagnosis not present

## 2015-09-12 DIAGNOSIS — E1022 Type 1 diabetes mellitus with diabetic chronic kidney disease: Secondary | ICD-10-CM | POA: Diagnosis not present

## 2015-09-12 DIAGNOSIS — Z6830 Body mass index (BMI) 30.0-30.9, adult: Secondary | ICD-10-CM | POA: Diagnosis not present

## 2015-09-12 DIAGNOSIS — Z0000 Encounter for general adult medical examination without abnormal findings: Secondary | ICD-10-CM | POA: Diagnosis not present

## 2015-09-12 DIAGNOSIS — N183 Chronic kidney disease, stage 3 (moderate): Secondary | ICD-10-CM | POA: Diagnosis not present

## 2015-09-12 DIAGNOSIS — Z1389 Encounter for screening for other disorder: Secondary | ICD-10-CM | POA: Diagnosis not present

## 2015-09-12 DIAGNOSIS — Z136 Encounter for screening for cardiovascular disorders: Secondary | ICD-10-CM | POA: Diagnosis not present

## 2015-09-12 DIAGNOSIS — E785 Hyperlipidemia, unspecified: Secondary | ICD-10-CM | POA: Diagnosis not present

## 2015-09-12 DIAGNOSIS — E039 Hypothyroidism, unspecified: Secondary | ICD-10-CM | POA: Diagnosis not present

## 2015-09-22 DIAGNOSIS — N183 Chronic kidney disease, stage 3 (moderate): Secondary | ICD-10-CM | POA: Diagnosis not present

## 2015-09-22 DIAGNOSIS — E785 Hyperlipidemia, unspecified: Secondary | ICD-10-CM | POA: Diagnosis not present

## 2015-09-22 DIAGNOSIS — I10 Essential (primary) hypertension: Secondary | ICD-10-CM | POA: Diagnosis not present

## 2015-09-22 DIAGNOSIS — E1022 Type 1 diabetes mellitus with diabetic chronic kidney disease: Secondary | ICD-10-CM | POA: Diagnosis not present

## 2015-09-22 DIAGNOSIS — E039 Hypothyroidism, unspecified: Secondary | ICD-10-CM | POA: Diagnosis not present

## 2015-09-26 DIAGNOSIS — E039 Hypothyroidism, unspecified: Secondary | ICD-10-CM | POA: Diagnosis not present

## 2015-12-04 DIAGNOSIS — E039 Hypothyroidism, unspecified: Secondary | ICD-10-CM | POA: Diagnosis not present

## 2015-12-04 DIAGNOSIS — E1042 Type 1 diabetes mellitus with diabetic polyneuropathy: Secondary | ICD-10-CM | POA: Diagnosis not present

## 2016-01-17 DIAGNOSIS — Z23 Encounter for immunization: Secondary | ICD-10-CM | POA: Diagnosis not present

## 2016-03-07 DIAGNOSIS — R51 Headache: Secondary | ICD-10-CM | POA: Diagnosis not present

## 2016-03-07 DIAGNOSIS — R112 Nausea with vomiting, unspecified: Secondary | ICD-10-CM | POA: Diagnosis not present

## 2016-03-25 DIAGNOSIS — E039 Hypothyroidism, unspecified: Secondary | ICD-10-CM | POA: Diagnosis not present

## 2016-03-25 DIAGNOSIS — E1042 Type 1 diabetes mellitus with diabetic polyneuropathy: Secondary | ICD-10-CM | POA: Diagnosis not present

## 2016-04-12 DIAGNOSIS — R109 Unspecified abdominal pain: Secondary | ICD-10-CM | POA: Diagnosis not present

## 2016-04-12 DIAGNOSIS — R141 Gas pain: Secondary | ICD-10-CM | POA: Diagnosis not present

## 2016-06-12 DIAGNOSIS — S43402A Unspecified sprain of left shoulder joint, initial encounter: Secondary | ICD-10-CM | POA: Diagnosis not present

## 2016-06-12 DIAGNOSIS — S20219A Contusion of unspecified front wall of thorax, initial encounter: Secondary | ICD-10-CM | POA: Diagnosis not present

## 2016-06-12 DIAGNOSIS — E1165 Type 2 diabetes mellitus with hyperglycemia: Secondary | ICD-10-CM | POA: Diagnosis not present

## 2016-06-26 DIAGNOSIS — E039 Hypothyroidism, unspecified: Secondary | ICD-10-CM | POA: Diagnosis not present

## 2016-06-26 DIAGNOSIS — E1042 Type 1 diabetes mellitus with diabetic polyneuropathy: Secondary | ICD-10-CM | POA: Diagnosis not present

## 2016-09-16 DIAGNOSIS — E1022 Type 1 diabetes mellitus with diabetic chronic kidney disease: Secondary | ICD-10-CM | POA: Diagnosis not present

## 2016-09-16 DIAGNOSIS — E1042 Type 1 diabetes mellitus with diabetic polyneuropathy: Secondary | ICD-10-CM | POA: Diagnosis not present

## 2016-09-16 DIAGNOSIS — I129 Hypertensive chronic kidney disease with stage 1 through stage 4 chronic kidney disease, or unspecified chronic kidney disease: Secondary | ICD-10-CM | POA: Diagnosis not present

## 2016-09-16 DIAGNOSIS — K219 Gastro-esophageal reflux disease without esophagitis: Secondary | ICD-10-CM | POA: Diagnosis not present

## 2016-09-16 DIAGNOSIS — N183 Chronic kidney disease, stage 3 (moderate): Secondary | ICD-10-CM | POA: Diagnosis not present

## 2016-09-16 DIAGNOSIS — E039 Hypothyroidism, unspecified: Secondary | ICD-10-CM | POA: Diagnosis not present

## 2016-09-16 DIAGNOSIS — E663 Overweight: Secondary | ICD-10-CM | POA: Diagnosis not present

## 2016-09-16 DIAGNOSIS — Z79899 Other long term (current) drug therapy: Secondary | ICD-10-CM | POA: Diagnosis not present

## 2016-09-16 DIAGNOSIS — E785 Hyperlipidemia, unspecified: Secondary | ICD-10-CM | POA: Diagnosis not present

## 2016-10-15 ENCOUNTER — Ambulatory Visit (INDEPENDENT_AMBULATORY_CARE_PROVIDER_SITE_OTHER): Payer: Medicare Other | Admitting: Cardiology

## 2016-10-15 ENCOUNTER — Encounter: Payer: Self-pay | Admitting: Cardiology

## 2016-10-15 VITALS — BP 186/66 | HR 76 | Ht 67.0 in | Wt 183.0 lb

## 2016-10-15 DIAGNOSIS — I779 Disorder of arteries and arterioles, unspecified: Secondary | ICD-10-CM

## 2016-10-15 DIAGNOSIS — I10 Essential (primary) hypertension: Secondary | ICD-10-CM

## 2016-10-15 DIAGNOSIS — I2510 Atherosclerotic heart disease of native coronary artery without angina pectoris: Secondary | ICD-10-CM | POA: Diagnosis not present

## 2016-10-15 DIAGNOSIS — I739 Peripheral vascular disease, unspecified: Secondary | ICD-10-CM

## 2016-10-15 DIAGNOSIS — R002 Palpitations: Secondary | ICD-10-CM | POA: Diagnosis not present

## 2016-10-15 DIAGNOSIS — Z952 Presence of prosthetic heart valve: Secondary | ICD-10-CM

## 2016-10-15 DIAGNOSIS — E782 Mixed hyperlipidemia: Secondary | ICD-10-CM

## 2016-10-15 DIAGNOSIS — E109 Type 1 diabetes mellitus without complications: Secondary | ICD-10-CM | POA: Diagnosis not present

## 2016-10-15 LAB — CBC
HEMATOCRIT: 37.9 % (ref 34.0–46.6)
HEMOGLOBIN: 12.4 g/dL (ref 11.1–15.9)
MCH: 28.5 pg (ref 26.6–33.0)
MCHC: 32.7 g/dL (ref 31.5–35.7)
MCV: 87 fL (ref 79–97)
Platelets: 324 10*3/uL (ref 150–379)
RBC: 4.35 x10E6/uL (ref 3.77–5.28)
RDW: 14.5 % (ref 12.3–15.4)
WBC: 7.8 10*3/uL (ref 3.4–10.8)

## 2016-10-15 NOTE — Progress Notes (Signed)
Cardiology Office Note   Date:  10/15/2016   ID:  Jaclyn [name], DOB 11-27-1945, MRN [id number]  PCP:  Melony Overly, MD  Cardiologist:  Dr. Johnsie Cancel    Chief Complaint  Patient presents with  . Coronary Artery Disease      History of Present Illness: Jaclyn [name] is a 71 y.o. female who presents for post AV replacement and CAD follow up.   She has a hx of aortic valve replacement and coronary artery bypass grafting x3 on 09/11/2011. Since then she has continued to do well. She has underwent a followup echocardiogram on 12/12/2011. This demonstrated normal left ventricular size and systolic function with ejection fraction estimated 55-60%. The bioprosthetic tissue valve in the aortic position was functioning normally with no aortic insufficiency and mean gradient of 8 mm mercury. No other significant abnormalities were noted.    07/06/13 Carotid duplex with 40-59% RICA stenosis   ECHO last year  EF 55-60% tissue AVR with normal gradients no leak . Mild MR.    Today she is doing well except heeling her heart beat in her chest at night, only at rest.  Not fast or irregular.  No chest pain or SOB.  Her BP is elevated today and she has been placed on Lisinopril 5 mg but she has not taken, I did ask her to take and monitor her BP.  At times in past BP has been labile.  Also not on statin due to myalgias but with LDL at 171 and hx of CAD and PAD her PCP added pravastatin, she has not yet taken, instructed she should try.  We also discussed Repatha, but she has no medication assistance with meds.  She is having a hard time affording her insulin- has switched to another insulin and her glucose is not well controlled.     Past Medical History:  Diagnosis Date  . Allergic rhinitis   . Anemia    blood transfusion- 6 yrs. ago by Dr. Ralene Ok   . Anxiety   . Aortic stenosis    a. 09/11/2011 s/p AVR - 40mm Edwards Magna Ease Pericardial Tissue Valve (with CABG x 3 as above).  .  Arthritis    knees  . Coronary artery disease    a. 09/11/2011 CABGx 3: LIMA->LAD, VG->D1, VG->PLV (with AVR - 8mm Edwards Magna Ease Pericardial Tissue Valve).  . Depression   . Dermatitis   . Diastolic CHF ([place])    a. EF 40-98%, grade 2 diastolic dysfunction by echo 06/2011  . DM (diabetes mellitus) ([place])    type I - treated with insulin pump /w x24yrs  . Fibrocystic disease of breast   . Foot pain   . Gastroparesis   . GERD (gastroesophageal reflux disease)   . Hematuria   . HTN (hypertension)    a. 09/2011 BP's running low (in setting of weight loss and elimination of salt from diet).  . Hyperlipidemia   . Hypothyroidism   . Keratosis   . 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 Menopause   . Microalbuminuria   . Orthostatic hypotension   . Osteoporosis   . Peripheral neuropathy   . Plantar fasciitis   . 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 S/P radiation therapy    Radiation therapy to chest and mediastinum for small cell carcinoma of the lung  . Seizures ([place])  33 yrs. ago, was on phenobarbital for sometime, off it for 20 yrs.   . Vitamin D deficiency     Past Surgical History:  Procedure Laterality Date  . AORTIC VALVE REPLACEMENT  09/11/2011   21 mm [facility] Ease pericardial tissue valve  . CARPAL TUNNEL RELEASE     bilateral  . CORONARY ARTERY BYPASS GRAFT  09/11/2011   LIMA to LAD, SVG to D1, SVG to RPL  . ELBOW SURGERY     bilateral  . MULTIPLE EXTRACTIONS WITH ALVEOLOPLASTY  08/29/2011   Procedure: MULTIPLE EXTRACION WITH ALVEOLOPLASTY;  Surgeon: Lenn Cal, DDS;  Location: [place];  Service: Oral Surgery;  Laterality: N/A;  extraction of teeth # 22, 27 with alveoloplasty  . SHOULDER SURGERY     bilateral- RCR-   . THYROIDECTOMY     partial- R   . TOTAL ABDOMINAL HYSTERECTOMY       Current Outpatient Prescriptions  Medication Sig Dispense Refill  . aspirin 325  MG EC tablet Take 325 mg by mouth daily.    Marland Kitchen HUMALOG 100 UNIT/ML injection Units are injection &  determined by a sliding scale, delivered by a insulin pump.    . Insulin Human (INSULIN PUMP) 100 unit/ml SOLN Inject into the skin. humalog- basal rate continuously & boluses as appropriate to her carbs & blood glucose    . levothyroxine (SYNTHROID, LEVOTHROID) 112 MCG tablet Take 112 mcg by mouth daily before breakfast.     . lisinopril (PRINIVIL,ZESTRIL) 5 MG tablet Take 1 tablet (5 mg total) by mouth daily. 90 tablet 3  . magnesium hydroxide (MILK OF MAGNESIA) 400 MG/5ML suspension Take 30 mLs by mouth daily as needed. For upset stomach     No current facility-administered medications for this visit.    Facility-Administered Medications Ordered in Other Visits  Medication Dose Route Frequency Provider Last Rate Last Dose  . albuterol (PROVENTIL) (5 MG/ML) 0.5% nebulizer solution 2.5 mg  2.5 mg Nebulization Once Rexene Alberts, MD        Allergies:   Statins    Social History:  The patient  reports that she quit smoking about 19 years ago. Her smoking use included Cigarettes. She has a 86.00 pack-year smoking history. She has never used smokeless tobacco. She reports that she does not drink alcohol or use drugs.   Family History:  The patient's family history includes Cancer in her brother and father; Coronary artery disease (age of onset: 96) in her brother; Heart failure in her mother; Hypertension in her mother; Lymphoma in her brother; Thyroid disease in her mother.    ROS:  General:no colds or fevers, no weight changes Skin:no rashes or ulcers HEENT:no blurred vision, no congestion CV:see HPI PUL:see HPI GI:no diarrhea constipation or melena, no indigestion GU:no hematuria, no dysuria MS:no joint pain, no claudication Neuro:no syncope, no lightheadedness Endo:+ diabetes, + thyroid disease stable  Wt Readings from Last 3 Encounters:  10/15/16 183 lb (83 kg)  10/19/14 186 lb  (84.4 kg)  06/17/13 185 lb (83.9 kg)     PHYSICAL EXAM: VS:  BP (!) 186/66   Pulse 76   Ht 5\' 7"  (1.702 m)   Wt 183 lb (83 kg)   SpO2 95%   BMI 28.66 kg/m  , BMI Body mass index is 28.66 kg/m. General:Pleasant affect, NAD Skin:Warm and dry, brisk capillary refill HEENT:normocephalic, sclera clear, mucus membranes moist Neck:supple, no JVD, no bruits  Heart:S1S2 RRR with sharp closure of valve no murmur, gallup,  rub or click Lungs:clear without rales, rhonchi, or wheezes PYK:DXIP, non tender, + BS, do not palpate liver spleen or masses Ext:+ lower ext edema 1+ bil. , 2+ pedal pulses, 2+ radial pulses Neuro:alert and oriented X 3, MAE, follows commands, + facial symmetry    EKG:  EKG is ordered today. The ekg ordered today demonstrates SR no acute changes.    Recent Labs: No results found for requested labs within last 8760 hours.    Lipid Panel No results found for: CHOL, TRIG, HDL, CHOLHDL, VLDL, LDLCALC, LDLDIRECT   T Chol 258, HDL 61, TG 131, LDL 171  TSH 1.150 Glucose 191, BUN 20 Cr 1.13 K+ 5.0    Other studies Reviewed: Additional studies/ records that were reviewed today include: .  Echo 11/22/14 Study Conclusions  - Left ventricle: The cavity size was mildly dilated. Wall   thickness was normal. Systolic function was normal. The estimated   ejection fraction was in the range of 55% to 60%. - Aortic valve: Tissue AVR with normal gradients and no   perivalvular leak. Valve area (VTI): 1.65 cm^2. Valve area   (Vmax): 1.46 cm^2. Valve area (Vmean): 1.47 cm^2. - Mitral valve: There was mild regurgitation. - Atrial septum: No defect or patent foramen ovale was identified.  Carotid doppler 11/14/14 .40-59% RICA stenosis. F/U duplex in 1 year.  ASSESSMENT AND PLAN:  1.  AVR with SBE prophylaxis with echo last year stable valve.    2.  CAD with CABG No chest pain  3.  Feeling heart beat.  Recent labs stable will have her wear 30 day event monitor to  capture what she is feeling.  Will check CBC today as well.  4.  PAD with RICA stenosis follow up doppler in Oct.  5.  DM on inuslin pump per PCP - has not been as well controlled recently.  6.  HTN elevated today, she has not been taking lisinopril will begin.  reminded to decrease Na, she uses lots of salt to prevent elevated BP and edema.    7.  HLD with LDL 171, her PCP gave her pravastatin and we discussed she is willing to try it   If abnormal event will have her come in otherwise follow up with Dr. Johnsie Cancel in 1 year.    Current medicines are reviewed with the patient today.  The patient Has no concerns regarding medicines.  The following changes have been made:  See above Labs/ tests ordered today include:see above  Disposition:   FU:  see above  Signed, Cecilie Kicks, NP  10/15/2016 10:36 AM    [place], [place], [place], [place] Phone: [phone]; Fax: [phone]

## 2016-10-15 NOTE — Patient Instructions (Signed)
Medication Instructions:  Your physician recommends that you continue on your current medications as directed. Please refer to the Current Medication list given to you today.   Labwork: TODAY: CBC  Testing/Procedures: Your physician has recommended that you wear an event monitor. Event monitors are medical devices that record the heart's electrical activity. Doctors most often [place] these monitors to diagnose arrhythmias. Arrhythmias are problems with the speed or rhythm of the heartbeat. The monitor is a small, portable device. You can wear one while you do your normal daily activities. This is usually used to diagnose what is causing palpitations/syncope (passing out).  Your physician has requested that you have a carotid duplex in October. This test is an ultrasound of the carotid arteries in your neck. It looks at blood flow through these arteries that supply the brain with blood. Allow one hour for this exam. There are no restrictions or special instructions.   Follow-Up: Your physician wants you to follow-up in: 1 year with Dr. Johnsie Cancel. You will receive a reminder letter in the mail two months in advance. If you don't receive a letter, please call our office to schedule the follow-up appointment.   Any Other Special Instructions Will Be Listed Below (If Applicable).     If you need a refill on your cardiac medications before your next appointment, please call your pharmacy.

## 2016-10-18 DIAGNOSIS — E1042 Type 1 diabetes mellitus with diabetic polyneuropathy: Secondary | ICD-10-CM | POA: Diagnosis not present

## 2016-10-18 DIAGNOSIS — E039 Hypothyroidism, unspecified: Secondary | ICD-10-CM | POA: Diagnosis not present

## 2016-10-30 ENCOUNTER — Ambulatory Visit (INDEPENDENT_AMBULATORY_CARE_PROVIDER_SITE_OTHER): Payer: Medicare Other

## 2016-10-30 DIAGNOSIS — R002 Palpitations: Secondary | ICD-10-CM

## 2016-11-08 DIAGNOSIS — E1042 Type 1 diabetes mellitus with diabetic polyneuropathy: Secondary | ICD-10-CM | POA: Diagnosis not present

## 2016-12-06 ENCOUNTER — Ambulatory Visit (HOSPITAL_COMMUNITY)
Admission: RE | Admit: 2016-12-06 | Discharge: 2016-12-06 | Disposition: A | Discharge status: Home / Self Care | Payer: Medicare Other | Source: Ambulatory Visit | Attending: Cardiology | Admitting: Cardiology

## 2016-12-06 DIAGNOSIS — I779 Disorder of arteries and arterioles, unspecified: Secondary | ICD-10-CM | POA: Diagnosis not present

## 2016-12-06 DIAGNOSIS — I6523 Occlusion and stenosis of bilateral carotid arteries: Secondary | ICD-10-CM | POA: Insufficient documentation

## 2016-12-06 DIAGNOSIS — I739 Peripheral vascular disease, unspecified: Secondary | ICD-10-CM

## 2016-12-18 DIAGNOSIS — Z23 Encounter for immunization: Secondary | ICD-10-CM | POA: Diagnosis not present

## 2016-12-30 DIAGNOSIS — Z79899 Other long term (current) drug therapy: Secondary | ICD-10-CM | POA: Diagnosis not present

## 2016-12-30 DIAGNOSIS — E1022 Type 1 diabetes mellitus with diabetic chronic kidney disease: Secondary | ICD-10-CM | POA: Diagnosis not present

## 2016-12-30 DIAGNOSIS — N183 Chronic kidney disease, stage 3 (moderate): Secondary | ICD-10-CM | POA: Diagnosis not present

## 2016-12-30 DIAGNOSIS — E039 Hypothyroidism, unspecified: Secondary | ICD-10-CM | POA: Diagnosis not present

## 2016-12-30 DIAGNOSIS — I10 Essential (primary) hypertension: Secondary | ICD-10-CM | POA: Diagnosis not present

## 2016-12-30 DIAGNOSIS — R42 Dizziness and giddiness: Secondary | ICD-10-CM | POA: Diagnosis not present

## 2016-12-31 DIAGNOSIS — E1022 Type 1 diabetes mellitus with diabetic chronic kidney disease: Secondary | ICD-10-CM | POA: Diagnosis not present

## 2016-12-31 DIAGNOSIS — E039 Hypothyroidism, unspecified: Secondary | ICD-10-CM | POA: Diagnosis not present

## 2016-12-31 DIAGNOSIS — R42 Dizziness and giddiness: Secondary | ICD-10-CM | POA: Diagnosis not present

## 2017-02-10 DIAGNOSIS — E1042 Type 1 diabetes mellitus with diabetic polyneuropathy: Secondary | ICD-10-CM | POA: Diagnosis not present

## 2017-02-10 DIAGNOSIS — E039 Hypothyroidism, unspecified: Secondary | ICD-10-CM | POA: Diagnosis not present

## 2017-02-17 DIAGNOSIS — E109 Type 1 diabetes mellitus without complications: Secondary | ICD-10-CM | POA: Diagnosis not present

## 2017-03-05 DIAGNOSIS — R69 Illness, unspecified: Secondary | ICD-10-CM | POA: Diagnosis not present

## 2017-03-07 DIAGNOSIS — E1042 Type 1 diabetes mellitus with diabetic polyneuropathy: Secondary | ICD-10-CM | POA: Diagnosis not present

## 2017-03-07 DIAGNOSIS — E109 Type 1 diabetes mellitus without complications: Secondary | ICD-10-CM | POA: Diagnosis not present

## 2017-03-10 DIAGNOSIS — R69 Illness, unspecified: Secondary | ICD-10-CM | POA: Diagnosis not present

## 2017-04-11 DIAGNOSIS — E039 Hypothyroidism, unspecified: Secondary | ICD-10-CM | POA: Diagnosis not present

## 2017-04-11 DIAGNOSIS — Z79899 Other long term (current) drug therapy: Secondary | ICD-10-CM | POA: Diagnosis not present

## 2017-04-11 DIAGNOSIS — N183 Chronic kidney disease, stage 3 (moderate): Secondary | ICD-10-CM | POA: Diagnosis not present

## 2017-04-11 DIAGNOSIS — I10 Essential (primary) hypertension: Secondary | ICD-10-CM | POA: Diagnosis not present

## 2017-04-11 DIAGNOSIS — E785 Hyperlipidemia, unspecified: Secondary | ICD-10-CM | POA: Diagnosis not present

## 2017-04-11 DIAGNOSIS — E1022 Type 1 diabetes mellitus with diabetic chronic kidney disease: Secondary | ICD-10-CM | POA: Diagnosis not present

## 2017-05-01 DIAGNOSIS — H25813 Combined forms of age-related cataract, bilateral: Secondary | ICD-10-CM | POA: Diagnosis not present

## 2017-05-01 DIAGNOSIS — E109 Type 1 diabetes mellitus without complications: Secondary | ICD-10-CM | POA: Diagnosis not present

## 2017-05-08 DIAGNOSIS — E1042 Type 1 diabetes mellitus with diabetic polyneuropathy: Secondary | ICD-10-CM | POA: Diagnosis not present

## 2017-05-08 DIAGNOSIS — Z9641 Presence of insulin pump (external) (internal): Secondary | ICD-10-CM | POA: Diagnosis not present

## 2017-05-08 DIAGNOSIS — E039 Hypothyroidism, unspecified: Secondary | ICD-10-CM | POA: Diagnosis not present

## 2017-05-28 DIAGNOSIS — E1042 Type 1 diabetes mellitus with diabetic polyneuropathy: Secondary | ICD-10-CM | POA: Diagnosis not present

## 2017-05-28 DIAGNOSIS — E109 Type 1 diabetes mellitus without complications: Secondary | ICD-10-CM | POA: Diagnosis not present

## 2017-06-03 DIAGNOSIS — E1042 Type 1 diabetes mellitus with diabetic polyneuropathy: Secondary | ICD-10-CM | POA: Diagnosis not present

## 2017-06-04 DIAGNOSIS — Z794 Long term (current) use of insulin: Secondary | ICD-10-CM | POA: Diagnosis not present

## 2017-06-04 DIAGNOSIS — E1042 Type 1 diabetes mellitus with diabetic polyneuropathy: Secondary | ICD-10-CM | POA: Diagnosis not present

## 2017-06-12 DIAGNOSIS — H25811 Combined forms of age-related cataract, right eye: Secondary | ICD-10-CM | POA: Diagnosis not present

## 2017-06-12 DIAGNOSIS — Z01818 Encounter for other preprocedural examination: Secondary | ICD-10-CM | POA: Diagnosis not present

## 2017-06-12 DIAGNOSIS — H40033 Anatomical narrow angle, bilateral: Secondary | ICD-10-CM | POA: Diagnosis not present

## 2017-06-12 DIAGNOSIS — E113293 Type 2 diabetes mellitus with mild nonproliferative diabetic retinopathy without macular edema, bilateral: Secondary | ICD-10-CM | POA: Diagnosis not present

## 2017-06-18 DIAGNOSIS — H25811 Combined forms of age-related cataract, right eye: Secondary | ICD-10-CM | POA: Diagnosis not present

## 2017-06-18 DIAGNOSIS — H2511 Age-related nuclear cataract, right eye: Secondary | ICD-10-CM | POA: Diagnosis not present

## 2017-06-25 DIAGNOSIS — H2512 Age-related nuclear cataract, left eye: Secondary | ICD-10-CM | POA: Diagnosis not present

## 2017-06-25 DIAGNOSIS — H25812 Combined forms of age-related cataract, left eye: Secondary | ICD-10-CM | POA: Diagnosis not present

## 2017-07-10 DIAGNOSIS — Z961 Presence of intraocular lens: Secondary | ICD-10-CM | POA: Diagnosis not present

## 2017-07-18 DIAGNOSIS — E559 Vitamin D deficiency, unspecified: Secondary | ICD-10-CM | POA: Diagnosis not present

## 2017-07-18 DIAGNOSIS — Z9641 Presence of insulin pump (external) (internal): Secondary | ICD-10-CM | POA: Diagnosis not present

## 2017-07-18 DIAGNOSIS — R69 Illness, unspecified: Secondary | ICD-10-CM | POA: Diagnosis not present

## 2017-07-18 DIAGNOSIS — E039 Hypothyroidism, unspecified: Secondary | ICD-10-CM | POA: Diagnosis not present

## 2017-07-18 DIAGNOSIS — E1042 Type 1 diabetes mellitus with diabetic polyneuropathy: Secondary | ICD-10-CM | POA: Diagnosis not present

## 2017-07-18 DIAGNOSIS — Z794 Long term (current) use of insulin: Secondary | ICD-10-CM | POA: Diagnosis not present

## 2017-08-18 DIAGNOSIS — E109 Type 1 diabetes mellitus without complications: Secondary | ICD-10-CM | POA: Diagnosis not present

## 2017-08-18 DIAGNOSIS — E1042 Type 1 diabetes mellitus with diabetic polyneuropathy: Secondary | ICD-10-CM | POA: Diagnosis not present

## 2017-08-22 DIAGNOSIS — Z1231 Encounter for screening mammogram for malignant neoplasm of breast: Secondary | ICD-10-CM | POA: Diagnosis not present

## 2017-08-22 DIAGNOSIS — K5909 Other constipation: Secondary | ICD-10-CM | POA: Diagnosis not present

## 2017-08-22 DIAGNOSIS — Z9181 History of falling: Secondary | ICD-10-CM | POA: Diagnosis not present

## 2017-08-22 DIAGNOSIS — Z1331 Encounter for screening for depression: Secondary | ICD-10-CM | POA: Diagnosis not present

## 2017-08-22 DIAGNOSIS — Z1339 Encounter for screening examination for other mental health and behavioral disorders: Secondary | ICD-10-CM | POA: Diagnosis not present

## 2017-08-22 DIAGNOSIS — R42 Dizziness and giddiness: Secondary | ICD-10-CM | POA: Diagnosis not present

## 2017-10-13 DIAGNOSIS — R69 Illness, unspecified: Secondary | ICD-10-CM | POA: Diagnosis not present

## 2017-10-17 DIAGNOSIS — Z1231 Encounter for screening mammogram for malignant neoplasm of breast: Secondary | ICD-10-CM | POA: Diagnosis not present

## 2017-10-21 ENCOUNTER — Telehealth: Payer: Self-pay | Admitting: Cardiovascular Disease

## 2017-10-21 NOTE — Telephone Encounter (Signed)
Called patient back about her message. Patient complaining of palpitations for about 2 weeks. Patient denies chest pain, SOB, or any other symptoms. Patient is not sure if this is something going on with her heart or with her GI track. Patient stated she has to depend on her daughter for transportation to appointments and that is why she could not come tomorrow. Informed patient that maybe she should go see her PCP to make sure this is nothing else, since she is suggesting GI issues. Patient stated she would keep her appointment on 11/04/17 and call her PCP. Encouraged patient to call back if her symptoms get worse.

## 2017-10-21 NOTE — Telephone Encounter (Signed)
° ° °  Patient declined appt for 9/11 with APP. Scheduled for  9/24  1) How long have you had palpitations/irregular HR/ Afib? Are you having the symptoms now? 2 WEEKS, NO  2) Are you currently experiencing lightheadedness, SOB or CP? NO  3) Do you have a history of afib (atrial fibrillation) or irregular heart rhythm? NO  4) Have you checked your BP or HR? (document readings if available): 138/74  5) Are you experiencing any other symptoms? NO

## 2017-10-23 DIAGNOSIS — E559 Vitamin D deficiency, unspecified: Secondary | ICD-10-CM | POA: Diagnosis not present

## 2017-10-23 DIAGNOSIS — E039 Hypothyroidism, unspecified: Secondary | ICD-10-CM | POA: Diagnosis not present

## 2017-10-23 DIAGNOSIS — E1042 Type 1 diabetes mellitus with diabetic polyneuropathy: Secondary | ICD-10-CM | POA: Diagnosis not present

## 2017-10-23 DIAGNOSIS — Z9641 Presence of insulin pump (external) (internal): Secondary | ICD-10-CM | POA: Diagnosis not present

## 2017-11-04 ENCOUNTER — Other Ambulatory Visit: Payer: Self-pay | Admitting: *Deleted

## 2017-11-04 ENCOUNTER — Encounter: Payer: Self-pay | Admitting: Nurse Practitioner

## 2017-11-04 ENCOUNTER — Ambulatory Visit (INDEPENDENT_AMBULATORY_CARE_PROVIDER_SITE_OTHER): Payer: Medicare HMO | Admitting: Nurse Practitioner

## 2017-11-04 VITALS — BP 152/62 | HR 81 | Ht 66.0 in | Wt 187.8 lb

## 2017-11-04 DIAGNOSIS — I10 Essential (primary) hypertension: Secondary | ICD-10-CM

## 2017-11-04 DIAGNOSIS — Z952 Presence of prosthetic heart valve: Secondary | ICD-10-CM

## 2017-11-04 DIAGNOSIS — I2510 Atherosclerotic heart disease of native coronary artery without angina pectoris: Secondary | ICD-10-CM

## 2017-11-04 DIAGNOSIS — R002 Palpitations: Secondary | ICD-10-CM

## 2017-11-04 DIAGNOSIS — E782 Mixed hyperlipidemia: Secondary | ICD-10-CM

## 2017-11-04 MED ORDER — LOSARTAN POTASSIUM 25 MG PO TABS: 25.0000 mg | ORAL_TABLET | Freq: Every day | ORAL | 3 refills | Status: DC

## 2017-11-04 NOTE — Patient Instructions (Addendum)
We will be checking the following labs today - NONE  BMET in about 1 to 2 weeks   Medication Instructions:    Continue with your current medicines. BUT  I am adding Losartan 25 mg a day - this is for your blood pressure. This is at your drug store.     Testing/Procedures To Be Arranged:  Echocardiogram to follow up your aortic valve replacement  Follow-Up:   See me in about a month    Other Special Instructions:   Would recommend restricting salt    If you need a refill on your cardiac medications before your next appointment, please call your pharmacy.   Call the [place] office at [phone] if you have any questions, problems or concerns.

## 2017-11-04 NOTE — Progress Notes (Signed)
CARDIOLOGY OFFICE NOTE  Date:  11/04/2017    Jaclyn [name] Date of Birth: 01/21/1946 Medical Record #[id number]  PCP:  Lowella Dandy, NP  Cardiologist:  Johnsie Cancel  Chief Complaint  Patient presents with  . Coronary Artery Disease  . Palpitations  . Hyperlipidemia  . Hypertension  . Cardiac Valve Problem    1 year check - seen for Dr. Johnsie Cancel    History of Present Illness: Jaclyn [name] is a 72 y.o. female who presents today for a one year check. Seen for Dr. Johnsie Cancel.   She has a history of AVR and CAD with prior CABG x 3 in 2013. Other issues include carotid disease, HTN, PAD, HLD and DM. She has had remote lung cancer - treated with radiation/chemo back in the late 90's.   Last seen by Dr. Johnsie Cancel in 2016. Last seen in the office by Cecilie Kicks, NP in September of 2018. Was felt to be doing well at that time but was not taking her statin - this was restarted.   She called earlier this month with palpitations/GI issues - was offered early appointment but not able to come due to transportation issues until today. Prior holter was negative a year ago.   Comes in today. Here alone. She feels like she has done well overall. No chest pain. Typically does not have shortness of breath unless she overexerts - but did have some last night - wonders if the room got too stuffy. She has been treated for lung cancer in the remote past. She does not restrict her salt. She is not taking her ACE - says that made her feel too bad. BP is up at home and here. Not dizzy. Brought me lab from last week which was reviewed. She feels like she is doing ok. Does not go to the dentist - has dentures. Her diabetes is controlled. She has started to notice some swelling in her feet. No real palpitations noted at this time - she thinks what happened earlier this month was from her stomach.   Past Medical History:  Diagnosis Date  . Allergic rhinitis   . Anemia    blood transfusion- 6 yrs. ago by Dr.  Ralene Ok   . Anxiety   . Aortic stenosis    a. 09/11/2011 s/p AVR - 64mm Edwards Magna Ease Pericardial Tissue Valve (with CABG x 3 as above).  . Arthritis    knees  . Coronary artery disease    a. 09/11/2011 CABGx 3: LIMA->LAD, VG->D1, VG->PLV (with AVR - 76mm Edwards Magna Ease Pericardial Tissue Valve).  . Depression   . Dermatitis   . Diastolic CHF ([place])    a. EF 19-50%, grade 2 diastolic dysfunction by echo 06/2011  . DM (diabetes mellitus) ([place])    type I - treated with insulin pump /w x88yrs  . Fibrocystic disease of breast   . Foot pain   . Gastroparesis   . GERD (gastroesophageal reflux disease)   . Hematuria   . HTN (hypertension)    a. 09/2011 BP's running low (in setting of weight loss and elimination of salt from diet).  . Hyperlipidemia   . Hypothyroidism   . Keratosis   . 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 Menopause   . Microalbuminuria   . Orthostatic hypotension   . Osteoporosis   . Peripheral neuropathy   .  Plantar fasciitis   . 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 S/P radiation therapy    Radiation therapy to chest and mediastinum for small cell carcinoma of the lung  . Seizures ([place])    33 yrs. ago, was on phenobarbital for sometime, off it for 20 yrs.   . Vitamin D deficiency     Past Surgical History:  Procedure Laterality Date  . AORTIC VALVE REPLACEMENT  09/11/2011   21 mm [facility] Ease pericardial tissue valve  . CARPAL TUNNEL RELEASE     bilateral  . CORONARY ARTERY BYPASS GRAFT  09/11/2011   LIMA to LAD, SVG to D1, SVG to RPL  . ELBOW SURGERY     bilateral  . MULTIPLE EXTRACTIONS WITH ALVEOLOPLASTY  08/29/2011   Procedure: MULTIPLE EXTRACION WITH ALVEOLOPLASTY;  Surgeon: Lenn Cal, DDS;  Location: [place];  Service: Oral Surgery;  Laterality: N/A;  extraction of teeth # 22, 27 with alveoloplasty  . SHOULDER SURGERY     bilateral- RCR-   .  THYROIDECTOMY     partial- R   . TOTAL ABDOMINAL HYSTERECTOMY       Medications: Current Meds  Medication Sig  . aspirin 325 MG EC tablet Take 325 mg by mouth daily.  Marland Kitchen HUMALOG 100 UNIT/ML injection Units are injection &  determined by a sliding scale, delivered by a insulin pump.  . Insulin Human (INSULIN PUMP) 100 unit/ml SOLN Inject into the skin. humalog- basal rate continuously & boluses as appropriate to her carbs & blood glucose  . insulin regular (NOVOLIN R,HUMULIN R) 100 units/mL injection Use as directed in insulin pump.  MDD: 65 units/day  . levothyroxine (SYNTHROID, LEVOTHROID) 112 MCG tablet Take 112 mcg by mouth daily before breakfast.   . magnesium hydroxide (MILK OF MAGNESIA) 400 MG/5ML suspension Take 30 mLs by mouth daily as needed. For upset stomach  . meclizine (ANTIVERT) 12.5 MG tablet Take 12.5 mg by mouth 2 (two) times daily as needed for dizziness.   . [DISCONTINUED] lisinopril (PRINIVIL,ZESTRIL) 5 MG tablet Take 1 tablet (5 mg total) by mouth daily.     Allergies: Allergies  Allergen Reactions  . Statins     Joints ache...crestor...lipitor...crestor    Social History: The patient  reports that she quit smoking about 20 years ago. Her smoking use included cigarettes. She has a 86.00 pack-year smoking history. She has never used smokeless tobacco. She reports that she does not drink alcohol or use drugs.   Family History: The patient's family history includes Cancer in her brother and father; Coronary artery disease (age of onset: 43) in her brother; Heart failure in her mother; Hypertension in her mother; Lymphoma in her brother; Thyroid disease in her mother.   Review of Systems: Please see the history of present illness.   Otherwise, the review of systems is positive for none.   All other systems are reviewed and negative.   Physical Exam: VS:  BP (!) 152/62 (BP Location: Left Arm, Patient Position: Sitting, Cuff Size: Large)   Pulse 81   Ht 5\' 6"   (1.676 m)   Wt 187 lb 12.8 oz (85.2 kg)   BMI 30.31 kg/m  .  BMI Body mass index is 30.31 kg/m.  Wt Readings from Last 3 Encounters:  11/04/17 187 lb 12.8 oz (85.2 kg)  10/15/16 183 lb (83 kg)  10/19/14 186 lb (84.4 kg)   BP by me is 170/80 General: Pleasant. Well developed, well nourished and in no acute distress.  HEENT: Normal.  Neck: Supple, no JVD, carotid bruits, or masses noted.  Cardiac: Regular rate and rhythm. No murmurs, rubs, or gallops. No edema.  Respiratory:  Lungs are clear to auscultation bilaterally with normal work of breathing.  GI: Soft and nontender.  MS: No deformity or atrophy. Gait and ROM intact.  Skin: Warm and dry. Color is normal.  Neuro:  Strength and sensation are intact and no gross focal deficits noted.  Psych: Alert, appropriate and with normal affect.   LABORATORY DATA:  EKG:  EKG is ordered today. This demonstrates NSR.  Lab Results  Component Value Date   WBC 7.8 10/15/2016   HGB 12.4 10/15/2016   HCT 37.9 10/15/2016   PLT 324 10/15/2016   GLUCOSE 152 (H) 09/24/2011   ALT 10 09/09/2011   AST 17 09/09/2011   NA 132 (L) 09/24/2011   K 4.4 09/24/2011   CL 91 (L) 09/24/2011   CREATININE 1.88 (H) 09/24/2011   BUN 30 (H) 09/24/2011   CO2 30 09/24/2011   INR 1.16 09/21/2011   HGBA1C 6.8 (H) 09/09/2011       BNP (last 3 results) No results for input(s): BNP in the last 8760 hours.  ProBNP (last 3 results) No results for input(s): PROBNP in the last 8760 hours.   Other Studies Reviewed Today:  Cardiac Monitor Study Highlights 10/2016  NSR Average HR 80 bpm Rare PAC;s / PVC;s Symptoms do not correlate with any arrhythmia     Echo Study Conclusions October 2016  - Left ventricle: The cavity size was mildly dilated. Wall   thickness was normal. Systolic function was normal. The estimated   ejection fraction was in the range of 55% to 60%. - Aortic valve: Tissue AVR with normal gradients and no   perivalvular leak.  Valve area (VTI): 1.65 cm^2. Valve area   (Vmax): 1.46 cm^2. Valve area (Vmean): 1.47 cm^2. - Mitral valve: There was mild regurgitation. - Atrial septum: No defect or patent foramen ovale was identified.   Assessment/Plan:  1. CAD with prior CABG/AVR - no active symptoms noted - would favor continued CV risk factor modification. Needs BP controlled. Needs echo updated.   2. DM -  Has pump in place - followed by Endocrine.   3. HTN - not controlled or at goal - she is not taking her ACE - will start Losartan 25 mg a day - BMET on day of echo. Needs to restrict salt.   4. HLD - not on her statin. Labs noted by PCP - she tells me she is not able to tolerate statins - may need to consider Zetia - will discuss further on return.   5. Carotid disease - last study from 11/2016 with 1 to 39% bilateral disease noted. Consider repeating in 2020.   Current medicines are reviewed with the patient today.  The patient does not have concerns regarding medicines other than what has been noted above.  The following changes have been made:  See above.  Labs/ tests ordered today include:    Orders Placed This Encounter  Procedures  . Basic metabolic panel  . EKG 12-Lead  . ECHOCARDIOGRAM COMPLETE     Disposition:   FU with me in about a month.   Patient is agreeable to this plan and will call if any problems develop in the interim.   SignedTruitt Merle, NP  11/04/2017 1:57 PM  [facility] [address] [place], [place]  [address] Phone: [phone] Fax: [phone]

## 2017-11-05 ENCOUNTER — Other Ambulatory Visit: Payer: Self-pay | Admitting: Internal Medicine

## 2017-11-05 DIAGNOSIS — N6489 Other specified disorders of breast: Secondary | ICD-10-CM

## 2017-11-05 DIAGNOSIS — R928 Other abnormal and inconclusive findings on diagnostic imaging of breast: Secondary | ICD-10-CM | POA: Diagnosis not present

## 2017-11-05 DIAGNOSIS — N6310 Unspecified lump in the right breast, unspecified quadrant: Secondary | ICD-10-CM

## 2017-11-05 DIAGNOSIS — R922 Inconclusive mammogram: Secondary | ICD-10-CM | POA: Diagnosis not present

## 2017-11-05 DIAGNOSIS — N651 Disproportion of reconstructed breast: Secondary | ICD-10-CM | POA: Diagnosis not present

## 2017-11-07 ENCOUNTER — Other Ambulatory Visit: Payer: Self-pay | Admitting: Internal Medicine

## 2017-11-07 DIAGNOSIS — N6310 Unspecified lump in the right breast, unspecified quadrant: Secondary | ICD-10-CM

## 2017-11-12 ENCOUNTER — Inpatient Hospital Stay: Admission: RE | Admit: 2017-11-12 | Payer: Self-pay | Source: Ambulatory Visit

## 2017-11-12 ENCOUNTER — Ambulatory Visit
Admission: RE | Admit: 2017-11-12 | Discharge: 2017-11-12 | Disposition: A | Discharge status: Home / Self Care | Payer: Medicare HMO | Source: Ambulatory Visit | Attending: Internal Medicine | Admitting: Internal Medicine

## 2017-11-12 ENCOUNTER — Other Ambulatory Visit: Payer: Self-pay | Admitting: Internal Medicine

## 2017-11-12 DIAGNOSIS — N6310 Unspecified lump in the right breast, unspecified quadrant: Secondary | ICD-10-CM

## 2017-11-12 DIAGNOSIS — N6489 Other specified disorders of breast: Secondary | ICD-10-CM | POA: Diagnosis not present

## 2017-11-12 DIAGNOSIS — N6311 Unspecified lump in the right breast, upper outer quadrant: Secondary | ICD-10-CM | POA: Diagnosis not present

## 2017-11-12 DIAGNOSIS — N6323 Unspecified lump in the left breast, lower outer quadrant: Secondary | ICD-10-CM | POA: Diagnosis not present

## 2017-11-12 DIAGNOSIS — N6012 Diffuse cystic mastopathy of left breast: Secondary | ICD-10-CM | POA: Diagnosis not present

## 2017-11-18 DIAGNOSIS — E109 Type 1 diabetes mellitus without complications: Secondary | ICD-10-CM | POA: Diagnosis not present

## 2017-11-18 DIAGNOSIS — E1042 Type 1 diabetes mellitus with diabetic polyneuropathy: Secondary | ICD-10-CM | POA: Diagnosis not present

## 2017-11-19 ENCOUNTER — Other Ambulatory Visit: Payer: Self-pay

## 2017-11-19 ENCOUNTER — Ambulatory Visit (HOSPITAL_COMMUNITY): Payer: Medicare HMO | Attending: Cardiovascular Disease

## 2017-11-19 ENCOUNTER — Other Ambulatory Visit: Payer: Medicare HMO | Admitting: *Deleted

## 2017-11-19 DIAGNOSIS — E782 Mixed hyperlipidemia: Secondary | ICD-10-CM | POA: Diagnosis not present

## 2017-11-19 DIAGNOSIS — R002 Palpitations: Secondary | ICD-10-CM | POA: Diagnosis not present

## 2017-11-19 DIAGNOSIS — I10 Essential (primary) hypertension: Secondary | ICD-10-CM | POA: Diagnosis not present

## 2017-11-19 DIAGNOSIS — Z952 Presence of prosthetic heart valve: Secondary | ICD-10-CM

## 2017-11-19 DIAGNOSIS — I2510 Atherosclerotic heart disease of native coronary artery without angina pectoris: Secondary | ICD-10-CM

## 2017-11-19 LAB — BASIC METABOLIC PANEL
BUN/Creatinine Ratio: 16 (ref 12–28)
BUN: 16 mg/dL (ref 8–27)
CO2: 24 mmol/L (ref 20–29)
Calcium: 10.6 mg/dL — ABNORMAL HIGH (ref 8.7–10.3)
Chloride: 97 mmol/L (ref 96–106)
Creatinine, Ser: 1.03 mg/dL — ABNORMAL HIGH (ref 0.57–1.00)
GFR calc Af Amer: 62 mL/min/{1.73_m2} (ref >59)
GFR calc non Af Amer: 54 mL/min/{1.73_m2} — ABNORMAL LOW (ref >59)
Glucose: 115 mg/dL — ABNORMAL HIGH (ref 65–99)
Potassium: 4.2 mmol/L (ref 3.5–5.2)
Sodium: 137 mmol/L (ref 134–144)

## 2017-12-01 ENCOUNTER — Encounter: Payer: Self-pay | Admitting: Nurse Practitioner

## 2017-12-01 ENCOUNTER — Ambulatory Visit: Payer: Medicare HMO | Admitting: Nurse Practitioner

## 2017-12-01 VITALS — BP 120/54 | HR 89 | Ht 66.4 in | Wt 185.4 lb

## 2017-12-01 DIAGNOSIS — I2510 Atherosclerotic heart disease of native coronary artery without angina pectoris: Secondary | ICD-10-CM

## 2017-12-01 DIAGNOSIS — Z952 Presence of prosthetic heart valve: Secondary | ICD-10-CM

## 2017-12-01 DIAGNOSIS — I10 Essential (primary) hypertension: Secondary | ICD-10-CM

## 2017-12-01 DIAGNOSIS — E7849 Other hyperlipidemia: Secondary | ICD-10-CM

## 2017-12-01 NOTE — Progress Notes (Signed)
CARDIOLOGY OFFICE NOTE  Date:  12/01/2017    Jaclyn [name] Date of Birth: 28-Apr-1945 Medical Record #[id number]  PCP:  Lowella Dandy, NP  Cardiologist:  Gillian Shields  Chief Complaint  Patient presents with  . Hypertension    3 week check - seen for Dr. Johnsie Cancel    History of Present Illness: Jaclyn [name] is a 72 y.o. female who presents today for a 3 week check. Seen for Dr. Johnsie Cancel.   She has a history of AVR and CAD with prior CABG x 3 in 2013. Other issues include carotid disease, HTN, PAD, HLD and DM. She has had remote lung cancer - treated with radiation/chemo back in the late 90's. She is not able to tolerate statins.   Last seen by Dr. Johnsie Cancel in 2016. Was then not seen in the office until 10/2016 by Cecilie Kicks, NP. Was felt to be doing well at that time but was not taking her statin - this was restarted.   I then saw her last month - she felt like she was doing ok. Was not taking her ACE due to feeling bad. BP was up. ARB was started. Echo was updated.   Comes in today. Here with her brother today. Some stress with caring for an older sister (44) whose husband is dying. Has not had her medicines yet today but BP is good. She feels good on the ARB. She is wanting to try something to get her cholesterol levels down. She is agreeable to Zetia. She says she is "scared of the shots". No chest pain. Breathing is good. She has no other concerns today. Echo results reviewed.   Past Medical History:  Diagnosis Date  . Allergic rhinitis   . Anemia    blood transfusion- 6 yrs. ago by Dr. Ralene Ok   . Anxiety   . Aortic stenosis    a. 09/11/2011 s/p AVR - 41mm [employer] [facility] Ease Pericardial Tissue Valve (with CABG x 3 as above).  . Arthritis    knees  . Coronary artery disease    a. 09/11/2011 CABGx 3: LIMA->LAD, VG->D1, VG->PLV (with AVR - 32mm [employer] Magna Ease Pericardial Tissue Valve).  . Depression   . Dermatitis   . Diastolic CHF ([place])    a. EF  64-33%, grade 2 diastolic dysfunction by echo 06/2011  . DM (diabetes mellitus) ([place])    type I - treated with insulin pump /w x22yrs  . Fibrocystic disease of breast   . Foot pain   . Gastroparesis   . GERD (gastroesophageal reflux disease)   . Hematuria   . HTN (hypertension)    a. 09/2011 BP's running low (in setting of weight loss and elimination of salt from diet).  . Hyperlipidemia   . Hypothyroidism   . Keratosis   . 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 Menopause   . Microalbuminuria   . Orthostatic hypotension   . Osteoporosis   . Peripheral neuropathy   . Plantar fasciitis   . 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 S/P radiation therapy    Radiation therapy to chest and mediastinum for small cell carcinoma of the lung  . Seizures ([place])    33 yrs. ago, was on phenobarbital for sometime, off it for 20 yrs.   . Vitamin D deficiency     Past Surgical History:  Procedure  Laterality Date  . AORTIC VALVE REPLACEMENT  09/11/2011   21 mm [facility] Ease pericardial tissue valve  . CARPAL TUNNEL RELEASE     bilateral  . CORONARY ARTERY BYPASS GRAFT  09/11/2011   LIMA to LAD, SVG to D1, SVG to RPL  . ELBOW SURGERY     bilateral  . MULTIPLE EXTRACTIONS WITH ALVEOLOPLASTY  08/29/2011   Procedure: MULTIPLE EXTRACION WITH ALVEOLOPLASTY;  Surgeon: Lenn Cal, DDS;  Location: [facility];  Service: Oral Surgery;  Laterality: N/A;  extraction of teeth # 22, 27 with alveoloplasty  . SHOULDER SURGERY     bilateral- RCR-   . THYROIDECTOMY     partial- R   . TOTAL ABDOMINAL HYSTERECTOMY       Medications: Current Meds  Medication Sig  . aspirin 325 MG EC tablet Take 325 mg by mouth daily.  Marland Kitchen HUMALOG 100 UNIT/ML injection Units are injection &  determined by a sliding scale, delivered by a insulin pump.  . Insulin Human (INSULIN PUMP) 100 unit/ml SOLN Inject into the skin. humalog- basal rate  continuously & boluses as appropriate to her carbs & blood glucose  . insulin regular (NOVOLIN R,HUMULIN R) 100 units/mL injection Use as directed in insulin pump.  MDD: 65 units/day  . levothyroxine (SYNTHROID, LEVOTHROID) 112 MCG tablet Take 112 mcg by mouth daily before breakfast.   . losartan (COZAAR) 25 MG tablet Take 1 tablet (25 mg total) by mouth daily.  . magnesium hydroxide (MILK OF MAGNESIA) 400 MG/5ML suspension Take 30 mLs by mouth daily as needed. For upset stomach  . meclizine (ANTIVERT) 12.5 MG tablet Take 12.5 mg by mouth 2 (two) times daily as needed for dizziness.      Allergies: Allergies  Allergen Reactions  . Statins     Joints ache...crestor...lipitor...crestor  . Codeine Other (See Comments)    Can't wake up    Social History: The patient  reports that she quit smoking about 20 years ago. Her smoking use included cigarettes. She has a 86.00 pack-year smoking history. She has never used smokeless tobacco. She reports that she does not drink alcohol or use drugs.   Family History: The patient's family history includes Cancer in her brother and father; Coronary artery disease (age of onset: 50) in her brother; Heart failure in her mother; Hypertension in her mother; Lymphoma in her brother; Thyroid disease in her mother.   Review of Systems: Please see the history of present illness.   Otherwise, the review of systems is positive for none.   All other systems are reviewed and negative.   Physical Exam: VS:  BP (!) 120/54 (BP Location: Left Arm, Patient Position: Sitting, Cuff Size: Large)   Pulse 89   Ht 5' 6.4" (1.687 m)   Wt 185 lb 6.4 oz (84.1 kg)   SpO2 95% Comment: at rest  BMI 29.56 kg/m  .  BMI Body mass index is 29.56 kg/m.  Wt Readings from Last 3 Encounters:  12/01/17 185 lb 6.4 oz (84.1 kg)  11/04/17 187 lb 12.8 oz (85.2 kg)  10/15/16 183 lb (83 kg)    General: Pleasant. Well developed, well nourished and in no acute distress.   HEENT:  Normal.  Neck: Supple, no JVD, carotid bruits, or masses noted.  Cardiac: Regular rate and rhythm. No murmurs, rubs, or gallops. No edema.  Respiratory:  Lungs are clear to auscultation bilaterally with normal work of breathing.  GI: Soft and nontender.  MS: No deformity or atrophy.  Gait and ROM intact.  Skin: Warm and dry. Color is normal.  Neuro:  Strength and sensation are intact and no gross focal deficits noted.  Psych: Alert, appropriate and with normal affect.   LABORATORY DATA:  EKG:  EKG is not ordered today.  Lab Results  Component Value Date   WBC 7.8 10/15/2016   HGB 12.4 10/15/2016   HCT 37.9 10/15/2016   PLT 324 10/15/2016   GLUCOSE 115 (H) 11/19/2017   ALT 10 09/09/2011   AST 17 09/09/2011   NA 137 11/19/2017   K 4.2 11/19/2017   CL 97 11/19/2017   CREATININE 1.03 (H) 11/19/2017   BUN 16 11/19/2017   CO2 24 11/19/2017   INR 1.16 09/21/2011   HGBA1C 6.8 (H) 09/09/2011       BNP (last 3 results) No results for input(s): BNP in the last 8760 hours.  ProBNP (last 3 results) No results for input(s): PROBNP in the last 8760 hours.   Other Studies Reviewed Today:  Echo Study Conclusions 11/2017  - Left ventricle: The cavity size was normal. Systolic function was   normal. The estimated ejection fraction was in the range of 55%   to 60%. Wall motion was normal; there were no regional wall   motion abnormalities. Features are consistent with a pseudonormal   left ventricular filling pattern, with concomitant abnormal   relaxation and increased filling pressure (grade 2 diastolic   dysfunction). Doppler parameters are consistent with high   ventricular filling pressure. - Aortic valve: A bioprosthesis was present and functioning   normally. Transvalvular velocity was within the normal range.   There was no stenosis. There was no regurgitation. Mean gradient   (S): 12 mm Hg. - Mitral valve: Transvalvular velocity was within the normal range.   There  was no evidence for stenosis. There was mild regurgitation. - Right ventricle: The cavity size was normal. Wall thickness was   normal. Systolic function was normal. - Atrial septum: No defect or patent foramen ovale was identified   by color flow Doppler. - Tricuspid valve: There was mild regurgitation. - Pulmonary arteries: Systolic pressure was within the normal   range. PA peak pressure: 32 mm Hg (S).    Cardiac Monitor Study Highlights 10/2016  NSR Average HR 80 bpm Rare PAC;s / PVC;s Symptoms do not correlate with any arrhythmia     Assessment/Plan:  1. CAD with prior CABG/AVR - her echo showed her aortic valve was working ok. Needs CV risk factor modification.   2. DM -  Has pump in place - followed by Endocrine. Not addressed today.   3. HTN - has not had her medicines yet today but her BP looks good. No changes made today. BMET today.    4. HLD - not on her statin. Labs noted by PCP - she tells me she is not able to tolerate statins - she is agreeable to trying Zetia - lab in 6 weeks if able to tolerate.   5. Carotid disease - last study from 11/2016 with 1 to 39% bilateral disease noted. Consider repeating in 2020. Not discussed today.   6. Diastolic dysfunction noted on most recent echo - needs good BP control and salt restriction. She is aware.    Current medicines are reviewed with the patient today.  The patient does not have concerns regarding medicines other than what has been noted above.  The following changes have been made:  See above.  Labs/ tests ordered today include:  No orders of the defined types were placed in this encounter.    Disposition:   FU with me in 6 months.   Patient is agreeable to this plan and will call if any problems develop in the interim.   SignedTruitt Merle, NP  12/01/2017 11:24 AM  [facility] [address] [place], [place]  [address] Phone: [phone] Fax:  [phone]

## 2017-12-01 NOTE — Patient Instructions (Addendum)
We will be checking the following labs today - BMET  Fasting labs in 6 weeks - lipids and LFTs  If you have labs (blood work) drawn today and your tests are completely normal, you will receive your results only by: Marland Kitchen MyChart Message (if you have MyChart) OR . A paper copy in the mail If you have any lab test that is abnormal or we need to change your treatment, we will call you to review the results.   Medication Instructions:    Continue with your current medicines. BUT  I am adding Zetia 10 mg a day for your cholesterol - this is at your pharmacy   If you need a refill on your cardiac medications before your next appointment, please call your pharmacy.     Testing/Procedures To Be Arranged:  N/A  Follow-Up:   See me in 6 months.     At [facility], you and your health needs are our priority.  As part of our continuing mission to provide you with exceptional heart care, we have created designated Provider Care Teams.  These Care Teams include your primary Cardiologist (physician) and Advanced Practice Providers (APPs -  Physician Assistants and Nurse Practitioners) who all work together to provide you with the care you need, when you need it.  Special Instructions:  . Try to restrict your salt as best you can.   Call the [place] office at [phone] if you have any questions, problems or concerns.

## 2017-12-02 LAB — BASIC METABOLIC PANEL
BUN/Creatinine Ratio: 19 (ref 12–28)
BUN: 23 mg/dL (ref 8–27)
CO2: 24 mmol/L (ref 20–29)
Calcium: 11 mg/dL — ABNORMAL HIGH (ref 8.7–10.3)
Chloride: 103 mmol/L (ref 96–106)
Creatinine, Ser: 1.2 mg/dL — ABNORMAL HIGH (ref 0.57–1.00)
GFR calc Af Amer: 52 mL/min/{1.73_m2} — ABNORMAL LOW (ref >59)
GFR calc non Af Amer: 45 mL/min/{1.73_m2} — ABNORMAL LOW (ref >59)
Glucose: 62 mg/dL — ABNORMAL LOW (ref 65–99)
Potassium: 4.7 mmol/L (ref 3.5–5.2)
Sodium: 141 mmol/L (ref 134–144)

## 2017-12-05 DIAGNOSIS — R69 Illness, unspecified: Secondary | ICD-10-CM | POA: Diagnosis not present

## 2017-12-10 ENCOUNTER — Ambulatory Visit: Payer: Self-pay | Admitting: Cardiovascular Disease

## 2017-12-11 ENCOUNTER — Other Ambulatory Visit: Payer: Self-pay

## 2017-12-11 MED ORDER — EZETIMIBE 10 MG PO TABS: 10.0000 mg | ORAL_TABLET | Freq: Every day | ORAL | 3 refills | Status: DC

## 2017-12-11 NOTE — Telephone Encounter (Signed)
Patient called and stated that her pharmacy informed her that they did not receive the rx for zetia. Per office visit 12/01/17 Patient Instructions by Burtis Junes, NP at 12/01/2017 11:00 AM  Author: Burtis Junes, NP Author Type: Nurse Practitioner Filed: 12/01/2017 11:24 AM  Note Status: Addendum Cosign: Cosign Not Required Encounter Date: 12/01/2017  Editor: Burtis Junes, NP (Nurse Practitioner)  Prior Versions: 1. Burtis Junes, NP (Nurse Practitioner) at 12/01/2017 11:06 AM - Signed    We will be checking the following labs today - BMET  Fasting labs in 6 weeks - lipids and LFTs  If you have labs (blood work) drawn today and your tests are completely normal, you will receive your results only by:  [place] (if you have MyChart) OR  A paper copy in the mail If you have any lab test that is abnormal or we need to change your treatment, we will call you to review the results.   Medication Instructions:    Continue with your current medicines. BUT  I am adding Zetia 10 mg a day for your cholesterol - this is at your pharmacy

## 2017-12-18 ENCOUNTER — Telehealth: Payer: Self-pay | Admitting: Cardiology

## 2017-12-18 NOTE — Telephone Encounter (Signed)
New message   Pt c/o medication issue:  1. Name of Medication: ezetimibe (ZETIA) 10 MG tablet  2. How are you currently taking this medication (dosage and times per day)? 1 time daily  3. Are you having a reaction (difficulty breathing--STAT)? n/a  4. What is your medication issue? Patient states that she can't afford this medication. Please advise.

## 2017-12-18 NOTE — Telephone Encounter (Signed)
Will route to ordering provider to make her aware and see if any further recommendations.

## 2017-12-19 NOTE — Telephone Encounter (Signed)
Can we see if [facility] Hawking can help her?   Other option would be to refer to lipid clinic at [address] to see what options she has but this would include PCSK9 therapy which she did not want to do ("the shots").  Cecille Rubin

## 2017-12-22 NOTE — Telephone Encounter (Signed)
**Note De-Identified Jaclyn [name] Obfuscation** I called the pts pharmacy and was advised that a 30 day supply of Zetia will cost the pt $77.93 and that a PA is not required as the pts insurance is paying their part.  I am attempting a tier exception but am unable as a list of tried and failed statins are required and I cannot locate this info in the pts chart.  I have read all notes in the pts chart but cannot locate any statins that the pt has tried and failed in the past Although It is mentioned several times that she cannot tolerate statins.  I will forward this message to Truitt Merle, NP for advisement.

## 2017-12-22 NOTE — Telephone Encounter (Signed)
Pravastatin had been prescribed by her PCP in the past - did she ever take?   Burtis Junes, RN, [facility] [address] [place], [place]  [address] [phone]

## 2017-12-23 NOTE — Telephone Encounter (Signed)
No vm will try later.

## 2017-12-25 ENCOUNTER — Telehealth: Payer: Self-pay | Admitting: *Deleted

## 2017-12-25 NOTE — Telephone Encounter (Signed)
Pt has been notified of lab results by phone. I asked pt if she has been ever taken Pravastatin that was prescribed by PCP in the past. Pt states yes along with having taken Crestor and Lipitor. I advised pt that I saw that Truitt Merle, NP in phone note 11/7 states maybe seeing the Lipid Clinic to discuss PCSK9 therapy. Pt states she would like to know if there is anything she can do diet wise right now. I advised her to limit her fatty, fried foods, limit simple carbs and eat more complex carbs, went over what this entails. Advised if eating beef make sure it is not fatty. Advised pt to read labels for trans fats and saturated fats, carbs etc. Pt states she has had 3 deaths in her family very recently and her 73 year old sister keeps firing her nursing help. She states that she herself needs to have breast surgery for possible breast cancer. Pt states she does not want to do anything right now but try and work on diet. I stated I will pass this message on to Truitt Merle, NP and that we will call back if there are any further recommendations. Pt thanked me for the call. Pt is agreeable to f/u with PCP about her calcium level.

## 2017-12-25 NOTE — Telephone Encounter (Signed)
Noted. Agree with recommendations.  

## 2017-12-25 NOTE — Telephone Encounter (Signed)
-----   Message from Burtis Junes, NP sent at 12/02/2017  7:44 AM EDT ----- Ok to report. Labs are stable - but calcium level a little high - would defer evaluation/recheck to her PCP - copy of lab to her PCP and otherwise, would continue on current regimen. Please ask her to follow up with her PCP

## 2017-12-26 NOTE — Telephone Encounter (Signed)
See previous phone note.  

## 2018-01-13 DIAGNOSIS — N6310 Unspecified lump in the right breast, unspecified quadrant: Secondary | ICD-10-CM | POA: Diagnosis not present

## 2018-01-13 DIAGNOSIS — E1022 Type 1 diabetes mellitus with diabetic chronic kidney disease: Secondary | ICD-10-CM | POA: Diagnosis not present

## 2018-01-13 DIAGNOSIS — I10 Essential (primary) hypertension: Secondary | ICD-10-CM | POA: Diagnosis not present

## 2018-01-13 DIAGNOSIS — R69 Illness, unspecified: Secondary | ICD-10-CM | POA: Diagnosis not present

## 2018-01-13 DIAGNOSIS — Z6830 Body mass index (BMI) 30.0-30.9, adult: Secondary | ICD-10-CM | POA: Diagnosis not present

## 2018-01-14 ENCOUNTER — Other Ambulatory Visit: Payer: Medicare HMO

## 2018-01-15 DIAGNOSIS — E113299 Type 2 diabetes mellitus with mild nonproliferative diabetic retinopathy without macular edema, unspecified eye: Secondary | ICD-10-CM | POA: Diagnosis not present

## 2018-01-15 DIAGNOSIS — H43811 Vitreous degeneration, right eye: Secondary | ICD-10-CM | POA: Diagnosis not present

## 2018-01-19 DIAGNOSIS — E1042 Type 1 diabetes mellitus with diabetic polyneuropathy: Secondary | ICD-10-CM | POA: Diagnosis not present

## 2018-01-21 ENCOUNTER — Telehealth: Payer: Self-pay | Admitting: *Deleted

## 2018-01-21 DIAGNOSIS — R928 Other abnormal and inconclusive findings on diagnostic imaging of breast: Secondary | ICD-10-CM | POA: Diagnosis not present

## 2018-01-21 DIAGNOSIS — N6310 Unspecified lump in the right breast, unspecified quadrant: Secondary | ICD-10-CM | POA: Diagnosis not present

## 2018-01-21 NOTE — Telephone Encounter (Signed)
   [facility] Pre-operative Risk Assessment    Request for surgical clearance:  1. What type of surgery is being performed? B/L BREAST BX FOR BREAST MASSES   2. When is this surgery scheduled? 01/26/18   3. Are there any medications that need to be held prior to surgery and how long? ASA    4. Practice name and name of physician performing surgery? [facility]   5. What is your office phone and fax number? PH# [phone]; FAX# [phone]   1. Anesthesia type (None, local, MAC, general) ? GENERAL    Julaine Hua 01/21/2018, 12:23 PM  _________________________________________________________________   (provider comments below)

## 2018-01-23 ENCOUNTER — Other Ambulatory Visit: Payer: Self-pay | Admitting: *Deleted

## 2018-01-23 NOTE — Telephone Encounter (Signed)
Pt unable to afford zetia, please arrange lipid clinic appt for pt.  Thanks.

## 2018-01-23 NOTE — Telephone Encounter (Addendum)
   Primary Cardiologist: [name], MD  Chart reviewed as part of pre-operative protocol coverage. Patient was contacted 01/23/2018 in reference to pre-operative risk assessment for pending surgery as outlined below.  Jaclyn [name] was last seen on 12/01/17 by Truitt Merle, NP.  Since that day, Jaclyn [name] has done well without chest pain or SOB.  Able to meet 4 METS.  Marland Kitchen  Therefore, based on ACC/AHA guidelines, the patient would be at acceptable risk for the planned procedure without further cardiovascular testing.   OK to hold Asprin for 3 days.  I will route this recommendation to the requesting party via Epic fax function and remove from pre-op pool.  Please call with questions.  Cecilie Kicks, NP 01/23/2018, 10:01 AM

## 2018-01-26 DIAGNOSIS — N6021 Fibroadenosis of right breast: Secondary | ICD-10-CM | POA: Diagnosis not present

## 2018-01-26 DIAGNOSIS — N62 Hypertrophy of breast: Secondary | ICD-10-CM | POA: Diagnosis not present

## 2018-01-26 DIAGNOSIS — N6311 Unspecified lump in the right breast, upper outer quadrant: Secondary | ICD-10-CM | POA: Diagnosis not present

## 2018-01-26 DIAGNOSIS — R928 Other abnormal and inconclusive findings on diagnostic imaging of breast: Secondary | ICD-10-CM | POA: Diagnosis not present

## 2018-01-26 DIAGNOSIS — N6321 Unspecified lump in the left breast, upper outer quadrant: Secondary | ICD-10-CM | POA: Diagnosis not present

## 2018-01-26 DIAGNOSIS — D242 Benign neoplasm of left breast: Secondary | ICD-10-CM | POA: Diagnosis not present

## 2018-01-26 DIAGNOSIS — N6081 Other benign mammary dysplasias of right breast: Secondary | ICD-10-CM | POA: Diagnosis not present

## 2018-01-26 DIAGNOSIS — N6091 Unspecified benign mammary dysplasia of right breast: Secondary | ICD-10-CM | POA: Diagnosis not present

## 2018-01-26 DIAGNOSIS — N6042 Mammary duct ectasia of left breast: Secondary | ICD-10-CM | POA: Diagnosis not present

## 2018-01-26 DIAGNOSIS — R921 Mammographic calcification found on diagnostic imaging of breast: Secondary | ICD-10-CM | POA: Diagnosis not present

## 2018-01-26 DIAGNOSIS — I130 Hypertensive heart and chronic kidney disease with heart failure and stage 1 through stage 4 chronic kidney disease, or unspecified chronic kidney disease: Secondary | ICD-10-CM | POA: Diagnosis not present

## 2018-01-26 DIAGNOSIS — N6012 Diffuse cystic mastopathy of left breast: Secondary | ICD-10-CM | POA: Diagnosis not present

## 2018-01-26 DIAGNOSIS — I5030 Unspecified diastolic (congestive) heart failure: Secondary | ICD-10-CM | POA: Diagnosis not present

## 2018-01-26 DIAGNOSIS — E039 Hypothyroidism, unspecified: Secondary | ICD-10-CM | POA: Diagnosis not present

## 2018-01-26 DIAGNOSIS — E1022 Type 1 diabetes mellitus with diabetic chronic kidney disease: Secondary | ICD-10-CM | POA: Diagnosis not present

## 2018-01-26 DIAGNOSIS — N6032 Fibrosclerosis of left breast: Secondary | ICD-10-CM | POA: Diagnosis not present

## 2018-01-26 DIAGNOSIS — E7849 Other hyperlipidemia: Secondary | ICD-10-CM | POA: Diagnosis not present

## 2018-01-26 NOTE — Telephone Encounter (Signed)
Maybe Jaclyn [name] could help with this??

## 2018-02-05 ENCOUNTER — Telehealth: Payer: Self-pay

## 2018-02-05 MED ORDER — PRAVASTATIN SODIUM 20 MG PO TABS: 20.0000 mg | ORAL_TABLET | Freq: Every evening | ORAL | 11 refills | Status: DC

## 2018-02-05 NOTE — Telephone Encounter (Signed)
Attempted to call patient, phone is busy

## 2018-02-05 NOTE — Telephone Encounter (Signed)
Spoke with the patient, she accepted taking Pravastatin 20 mg, daily and said she will schedule the fasting labs once she takes the medication to see if she can tolerate.

## 2018-02-05 NOTE — Telephone Encounter (Signed)
I called pt to ask her to come by the office to fill out paperwork for Zetia Pt Asstistance to help her afford the medication. She stated that it was a waste of time because she has tried to get asst before for a different medication and was denied.  She stated that she was willing to try a Statin again to see if she could tolerate it. She wants it to be a cheap one so she can afford it.

## 2018-02-05 NOTE — Telephone Encounter (Signed)
She can try Pravachol 20 mg a day - needs Lipids and LFTs in 6 weeks if able to tolerate.   Burtis Junes, RN, [facility] [address] [place], [place]  [address] [phone]

## 2018-02-06 NOTE — Telephone Encounter (Signed)
**Note De-Identified Gunda Maqueda Obfuscation** See phone note from 02/05/18.

## 2018-02-24 DIAGNOSIS — Z09 Encounter for follow-up examination after completed treatment for conditions other than malignant neoplasm: Secondary | ICD-10-CM | POA: Diagnosis not present

## 2018-02-24 DIAGNOSIS — Z6830 Body mass index (BMI) 30.0-30.9, adult: Secondary | ICD-10-CM | POA: Diagnosis not present

## 2018-02-24 DIAGNOSIS — E1022 Type 1 diabetes mellitus with diabetic chronic kidney disease: Secondary | ICD-10-CM | POA: Diagnosis not present

## 2018-02-24 DIAGNOSIS — R42 Dizziness and giddiness: Secondary | ICD-10-CM | POA: Diagnosis not present

## 2018-02-24 DIAGNOSIS — L659 Nonscarring hair loss, unspecified: Secondary | ICD-10-CM | POA: Diagnosis not present

## 2018-02-25 NOTE — Telephone Encounter (Signed)
No answer will try later.

## 2018-03-12 DIAGNOSIS — E1065 Type 1 diabetes mellitus with hyperglycemia: Secondary | ICD-10-CM | POA: Diagnosis not present

## 2018-03-12 DIAGNOSIS — E1042 Type 1 diabetes mellitus with diabetic polyneuropathy: Secondary | ICD-10-CM | POA: Diagnosis not present

## 2018-03-12 DIAGNOSIS — E559 Vitamin D deficiency, unspecified: Secondary | ICD-10-CM | POA: Diagnosis not present

## 2018-03-12 DIAGNOSIS — E039 Hypothyroidism, unspecified: Secondary | ICD-10-CM | POA: Diagnosis not present

## 2018-04-20 DIAGNOSIS — R69 Illness, unspecified: Secondary | ICD-10-CM | POA: Diagnosis not present

## 2018-04-24 DIAGNOSIS — R69 Illness, unspecified: Secondary | ICD-10-CM | POA: Diagnosis not present

## 2018-05-08 DIAGNOSIS — E1042 Type 1 diabetes mellitus with diabetic polyneuropathy: Secondary | ICD-10-CM | POA: Diagnosis not present

## 2018-05-08 DIAGNOSIS — E109 Type 1 diabetes mellitus without complications: Secondary | ICD-10-CM | POA: Diagnosis not present

## 2018-05-12 DIAGNOSIS — J309 Allergic rhinitis, unspecified: Secondary | ICD-10-CM | POA: Diagnosis not present

## 2018-05-12 DIAGNOSIS — J069 Acute upper respiratory infection, unspecified: Secondary | ICD-10-CM | POA: Diagnosis not present

## 2018-05-13 ENCOUNTER — Telehealth: Payer: Self-pay | Admitting: *Deleted

## 2018-05-13 NOTE — Telephone Encounter (Signed)
S/w pt about upcoming phone visit with Kathrene Alu.  Pt did take bp while on phone 163/90  HR  89, told pt to keep bp cuff close for phone visit on Monday do to bp being on the higher side and Cecille Rubin will want a recheck, pt stated it usually stays a little high, pt did take meds this am.   Pt also weighed while on phone at 183.8 lb.  Updated pt's medication list and pt does not need any refills.  Pt had no concerns with heart.  Will send to [facility].

## 2018-05-17 NOTE — Progress Notes (Signed)
Telehealth Visit     Virtual Visit via Telephone Note   This visit type was conducted due to national recommendations for restrictions regarding the COVID-19 Pandemic (e.g. social distancing) in an effort to limit this patient's exposure and mitigate transmission in our community.  Due to her co-morbid illnesses, this patient is at least at moderate risk for complications without adequate follow up.  This format is felt to be most appropriate for this patient at this time.  The patient did not have access to video technology/had technical difficulties with video requiring transitioning to audio format only (telephone).  All issues noted in this document were discussed and addressed.  No physical exam could be performed with this format.  Please refer to the patient's chart for her  consent to telehealth for [facility].   Evaluation Performed:  Follow-up visit  This visit type was conducted due to national recommendations for restrictions regarding the COVID-19 Pandemic (e.g. social distancing).  This format is felt to be most appropriate for this patient at this time.  All issues noted in this document were discussed and addressed.  No physical exam was performed (except for noted visual exam findings with Video Visits).  Please refer to the patient's chart (MyChart message for video visits and phone note for telephone visits) for the patient's consent to telehealth for [facility].  Date:  05/18/2018   ID:  Jaclyn [name], DOB [birth year], MRN [id number]  Patient Location:  Home  Provider location:   Home  PCP:  Lowella Dandy, NP  Cardiologist:  Servando Snare [name], MD  Electrophysiologist:  None   Chief Complaint:  Follow up visit. 6 month check.   History of Present Illness:    Jaclyn [name] is a 73 y.o. female who presents via audio/video conferencing for a telehealth visit today. Seen for Dr. Johnsie Cancel.   She has a history of AVRand CAD with prior CABG x 3 in 2013.  Other issues include carotid disease, HTN, PAD, HLD and DM.She has had remote lung cancer - treated with radiation/chemo back in the late 90's.She is not able to tolerate statins.   Last seen by Dr. Johnsie Cancel in 2016. Was then not seen in the office until 10/2016 by Cecilie Kicks, NP. Was felt to be doing well at that time but was not taking her statin- this was restarted. I have followed her since. She has had issues with tolerating medicines. Last echo updated in 2019.   Last seen in October - some stress with caring for an older sister whose husband was dying. Was wanting to try something to help get her lipids down. She was agreeable to trying Zetia.   The patient does not have symptoms concerning for COVID-19 infection (fever, chills, cough, or new shortness of breath).   Seen today via telephone conversation. She has given consent. She has no worrisome concerns. BP today is 146/78 but will have readings less than this - basically not over 140 consistently but admits that stress does aggravate it and she has been stressed with the current pandemic situation. She has had some allergy symptoms - did receive some antibiotics but has had no fever. No chest pain and is not short of breath. She got 60 baby chickens and is working in her garden - thought this would be good to get her mind off of things. She is not taking any medication now for her lipids due to intolerance. Does not wish to try at this time.  Past Medical History:  Diagnosis Date  . Allergic rhinitis   . Anemia    blood transfusion- 6 yrs. ago by Dr. Ralene Ok   . Anxiety   . Aortic stenosis    a. 09/11/2011 s/p AVR - 15mm Edwards Magna Ease Pericardial Tissue Valve (with CABG x 3 as above).  . Arthritis    knees  . Coronary artery disease    a. 09/11/2011 CABGx 3: LIMA->LAD, VG->D1, VG->PLV (with AVR - 44mm Edwards Magna Ease Pericardial Tissue Valve).  . Depression   . Dermatitis   . Diastolic CHF ([place])    a. EF 55-60%, grade  2 diastolic dysfunction by echo 06/2011  . DM (diabetes mellitus) ([place])    type I - treated with insulin pump /w x34yrs  . Fibrocystic disease of breast   . Foot pain   . Gastroparesis   . GERD (gastroesophageal reflux disease)   . Hematuria   . HTN (hypertension)    a. 09/2011 BP's running low (in setting of weight loss and elimination of salt from diet).  . Hyperlipidemia   . Hypothyroidism   . Keratosis   . 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 Menopause   . Microalbuminuria   . Orthostatic hypotension   . Osteoporosis   . Peripheral neuropathy   . Plantar fasciitis   . 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 S/P radiation therapy    Radiation therapy to chest and mediastinum for small cell carcinoma of the lung  . Seizures ([place])    33 yrs. ago, was on phenobarbital for sometime, off it for 20 yrs.   . Vitamin D deficiency    Past Surgical History:  Procedure Laterality Date  . AORTIC VALVE REPLACEMENT  09/11/2011   21 mm [facility] Ease pericardial tissue valve  . CARPAL TUNNEL RELEASE     bilateral  . CORONARY ARTERY BYPASS GRAFT  09/11/2011   LIMA to LAD, SVG to D1, SVG to RPL  . ELBOW SURGERY     bilateral  . MULTIPLE EXTRACTIONS WITH ALVEOLOPLASTY  08/29/2011   Procedure: MULTIPLE EXTRACION WITH ALVEOLOPLASTY;  Surgeon: Lenn Cal, DDS;  Location: [place] [facility];  Service: Oral Surgery;  Laterality: N/A;  extraction of teeth # 22, 27 with alveoloplasty  . SHOULDER SURGERY     bilateral- RCR-   . THYROIDECTOMY     partial- R   . TOTAL ABDOMINAL HYSTERECTOMY       Current Meds  Medication Sig  . aspirin 325 MG EC tablet Take 325 mg by mouth daily.  . fluticasone (FLONASE) 50 MCG/ACT nasal spray Place 2 sprays into both nostrils daily.   . Insulin Human (INSULIN PUMP) 100 unit/ml SOLN Inject into the skin. humalog- basal rate continuously & boluses as appropriate to her carbs & blood  glucose  . insulin regular (NOVOLIN R,HUMULIN R) 100 units/mL injection Use as directed in insulin pump.  MDD: 65 units/day  . levothyroxine (SYNTHROID, LEVOTHROID) 112 MCG tablet Take 112 mcg by mouth daily before breakfast.   . magnesium hydroxide (MILK OF MAGNESIA) 400 MG/5ML suspension Take 30 mLs by mouth daily as needed. For upset stomach  . meclizine (ANTIVERT) 12.5 MG tablet Take 12.5 mg by mouth 2 (two) times daily as needed for dizziness.      Allergies:   Statins and Codeine   Social History   Tobacco Use  .  Smoking status: Former Smoker    Packs/day: 2.00    Years: 43.00    Pack years: 86.00    Types: Cigarettes    Last attempt to quit: 08/27/1997    Years since quitting: 20.7  . Smokeless tobacco: Never Used  Substance Use Topics  . Alcohol use: No  . Drug use: No     Family Hx: The patient's family history includes Cancer in her brother and father; Coronary artery disease (age of onset: 21) in her brother; Heart failure in her mother; Hypertension in her mother; Lymphoma in her brother; Thyroid disease in her mother.  ROS:   Please see the history of present illness.   All other systems reviewed are negative except for none.    Objective:    Vital Signs:  BP (!) 146/78   Pulse 89   Wt 184 lb (83.5 kg)   BMI 29.34 kg/m    Wt Readings from Last 3 Encounters:  05/18/18 184 lb (83.5 kg)  12/01/17 185 lb 6.4 oz (84.1 kg)  11/04/17 187 lb 12.8 oz (85.2 kg)    Alert. No acute distress noted. Not short of breath with conversation.    Labs/Other Tests and Data Reviewed:    Lab Results  Component Value Date   WBC 7.8 10/15/2016   HGB 12.4 10/15/2016   HCT 37.9 10/15/2016   PLT 324 10/15/2016   GLUCOSE 62 (L) 12/01/2017   ALT 10 09/09/2011   AST 17 09/09/2011   NA 141 12/01/2017   K 4.7 12/01/2017   CL 103 12/01/2017   CREATININE 1.20 (H) 12/01/2017   BUN 23 12/01/2017   CO2 24 12/01/2017   INR 1.16 09/21/2011   HGBA1C 6.8 (H) 09/09/2011          BNP (last 3 results) No results for input(s): BNP in the last 8760 hours.  ProBNP (last 3 results) No results for input(s): PROBNP in the last 8760 hours.    Prior CV studies:    The following studies were reviewed today:  Echo Study Conclusions 11/2017  - Left ventricle: The cavity size was normal. Systolic function was normal. The estimated ejection fraction was in the range of 55% to 60%. Wall motion was normal; there were no regional wall motion abnormalities. Features are consistent with a pseudonormal left ventricular filling pattern, with concomitant abnormal relaxation and increased filling pressure (grade 2 diastolic dysfunction). Doppler parameters are consistent with high ventricular filling pressure. - Aortic valve: A bioprosthesis was present and functioning normally. Transvalvular velocity was within the normal range. There was no stenosis. There was no regurgitation. Mean gradient (S): 12 mm Hg. - Mitral valve: Transvalvular velocity was within the normal range. There was no evidence for stenosis. There was mild regurgitation. - Right ventricle: The cavity size was normal. Wall thickness was normal. Systolic function was normal. - Atrial septum: No defect or patent foramen ovale was identified by color flow Doppler. - Tricuspid valve: There was mild regurgitation. - Pulmonary arteries: Systolic pressure was within the normal range. PA peak pressure: 32 mm Hg (S).    Cardiac MonitorStudy Highlights9/2018  NSR Average HR 80 bpm Rare PAC;s / PVC;s Symptoms do not correlate with any arrhythmia     ASSESSMENT & PLAN:    1. CAD with prior CABG/AVR- her last echo from 2019 showed her aortic valve was working ok. Needs CV risk factor modification. Reminded about SBE. Encouraged continued activity.   2. DM- Has a new insulin pump in place per  her report - followed by Endocrine.  3. HTN- BP for the most part is ok -  she remains on low dose ARB - she notes that taking more tends to make her feel bad - she will continue to monitor.   4. HLD- statin intolerant - does not wish to entertain trying something new. Will discuss on return visit in person.   5. Carotid disease - last study from 11/2016 with 1 to 39% bilateral disease noted.Consider repeating later in 2020 - will discuss arranging on return visit.  6. Diastolic dysfunction noted on most recent echo from 2019 - needs good BP control and salt restriction. She is aware.   7. COVID-19 Education: The signs and symptoms of COVID-19 were discussed with the patient and how to seek care for testing (follow up with PCP or arrange E-visit).  The importance of social distancing, staying at home and hand hygiene were discussed today.  Patient Risk:   After full review of this patient's clinical status, I feel that they are at least moderate risk at this time.  Time:   Today, I have spent 10 minutes with the patient with telehealth technology discussing the above issues.     Medication Adjustments/Labs and Tests Ordered: Current medicines are reviewed at length with the patient today.  Concerns regarding medicines are outlined above.   Tests Ordered: No orders of the defined types were placed in this encounter.   Medication Changes: No orders of the defined types were placed in this encounter.   Disposition:  FU with me in 6 months.   Patient is agreeable to this plan and will call if any problems develop in the interim.   Amie Critchley, NP  05/18/2018 11:11 AM    [facility]

## 2018-05-18 ENCOUNTER — Other Ambulatory Visit: Payer: Self-pay

## 2018-05-18 ENCOUNTER — Telehealth: Payer: Self-pay | Admitting: Nurse Practitioner

## 2018-05-18 ENCOUNTER — Encounter: Payer: Self-pay | Admitting: Nurse Practitioner

## 2018-05-18 ENCOUNTER — Telehealth (INDEPENDENT_AMBULATORY_CARE_PROVIDER_SITE_OTHER): Payer: Medicare HMO | Admitting: Nurse Practitioner

## 2018-05-18 ENCOUNTER — Other Ambulatory Visit: Payer: Self-pay | Admitting: *Deleted

## 2018-05-18 VITALS — BP 146/78 | HR 89 | Wt 184.0 lb

## 2018-05-18 DIAGNOSIS — Z794 Long term (current) use of insulin: Secondary | ICD-10-CM

## 2018-05-18 DIAGNOSIS — Z952 Presence of prosthetic heart valve: Secondary | ICD-10-CM | POA: Diagnosis not present

## 2018-05-18 DIAGNOSIS — I2510 Atherosclerotic heart disease of native coronary artery without angina pectoris: Secondary | ICD-10-CM

## 2018-05-18 DIAGNOSIS — I6523 Occlusion and stenosis of bilateral carotid arteries: Secondary | ICD-10-CM

## 2018-05-18 DIAGNOSIS — R002 Palpitations: Secondary | ICD-10-CM

## 2018-05-18 DIAGNOSIS — I10 Essential (primary) hypertension: Secondary | ICD-10-CM

## 2018-05-18 DIAGNOSIS — E119 Type 2 diabetes mellitus without complications: Secondary | ICD-10-CM | POA: Diagnosis not present

## 2018-05-18 DIAGNOSIS — Z0000 Encounter for general adult medical examination without abnormal findings: Secondary | ICD-10-CM | POA: Diagnosis not present

## 2018-05-18 DIAGNOSIS — Z136 Encounter for screening for cardiovascular disorders: Secondary | ICD-10-CM | POA: Diagnosis not present

## 2018-05-18 DIAGNOSIS — Z1331 Encounter for screening for depression: Secondary | ICD-10-CM | POA: Diagnosis not present

## 2018-05-18 DIAGNOSIS — E785 Hyperlipidemia, unspecified: Secondary | ICD-10-CM | POA: Diagnosis not present

## 2018-05-18 DIAGNOSIS — Z79899 Other long term (current) drug therapy: Secondary | ICD-10-CM | POA: Diagnosis not present

## 2018-05-18 DIAGNOSIS — Z9181 History of falling: Secondary | ICD-10-CM | POA: Diagnosis not present

## 2018-05-18 DIAGNOSIS — E782 Mixed hyperlipidemia: Secondary | ICD-10-CM

## 2018-05-18 DIAGNOSIS — T466X5A Adverse effect of antihyperlipidemic and antiarteriosclerotic drugs, initial encounter: Secondary | ICD-10-CM

## 2018-05-18 DIAGNOSIS — Z1211 Encounter for screening for malignant neoplasm of colon: Secondary | ICD-10-CM | POA: Diagnosis not present

## 2018-05-18 DIAGNOSIS — E669 Obesity, unspecified: Secondary | ICD-10-CM | POA: Diagnosis not present

## 2018-05-18 DIAGNOSIS — Z6830 Body mass index (BMI) 30.0-30.9, adult: Secondary | ICD-10-CM | POA: Diagnosis not present

## 2018-05-18 DIAGNOSIS — G720 Drug-induced myopathy: Secondary | ICD-10-CM

## 2018-05-18 DIAGNOSIS — N959 Unspecified menopausal and perimenopausal disorder: Secondary | ICD-10-CM | POA: Diagnosis not present

## 2018-05-18 DIAGNOSIS — E7849 Other hyperlipidemia: Secondary | ICD-10-CM | POA: Diagnosis not present

## 2018-05-18 MED ORDER — LOSARTAN POTASSIUM 25 MG PO TABS: 25.0000 mg | ORAL_TABLET | Freq: Every day | ORAL | 3 refills | Status: DC

## 2018-05-18 NOTE — Telephone Encounter (Signed)
Good Morning,   Spoke with Jaclyn [name] and she does not have a computer or internet access. She said that she has everything ready for her phone visit.

## 2018-05-18 NOTE — Patient Instructions (Addendum)
After Visit Summary:  We will be checking the following labs today - NONE   Medication Instructions:    Continue with your current medicines.    If you need a refill on your cardiac medications before your next appointment, please call your pharmacy.     Testing/Procedures To Be Arranged:  N/A  Follow-Up:   See me in 6 months     At [facility], you and your health needs are our priority.  As part of our continuing mission to provide you with exceptional heart care, we have created designated Provider Care Teams.  These Care Teams include your primary Cardiologist (physician) and Advanced Practice Providers (APPs -  Physician Assistants and Nurse Practitioners) who all work together to provide you with the care you need, when you need it.  Special Instructions:  . Stay safe, stay home and wash your hands for at least 20 seconds! . Keep a check on your BP for [place].  . Have fun with your chickens! . It was nice to talk with you today.   Call the [place] office at [phone] if you have any questions, problems or concerns.

## 2018-06-03 DIAGNOSIS — J019 Acute sinusitis, unspecified: Secondary | ICD-10-CM | POA: Diagnosis not present

## 2018-06-03 DIAGNOSIS — J302 Other seasonal allergic rhinitis: Secondary | ICD-10-CM | POA: Diagnosis not present

## 2018-07-06 DIAGNOSIS — R69 Illness, unspecified: Secondary | ICD-10-CM | POA: Diagnosis not present

## 2018-07-31 DIAGNOSIS — E039 Hypothyroidism, unspecified: Secondary | ICD-10-CM | POA: Diagnosis not present

## 2018-07-31 DIAGNOSIS — E1040 Type 1 diabetes mellitus with diabetic neuropathy, unspecified: Secondary | ICD-10-CM | POA: Diagnosis not present

## 2018-07-31 DIAGNOSIS — E559 Vitamin D deficiency, unspecified: Secondary | ICD-10-CM | POA: Diagnosis not present

## 2018-08-06 DIAGNOSIS — E109 Type 1 diabetes mellitus without complications: Secondary | ICD-10-CM | POA: Diagnosis not present

## 2018-08-06 DIAGNOSIS — E1042 Type 1 diabetes mellitus with diabetic polyneuropathy: Secondary | ICD-10-CM | POA: Diagnosis not present

## 2018-08-16 ENCOUNTER — Other Ambulatory Visit: Payer: Self-pay | Admitting: Nurse Practitioner

## 2018-08-16 DIAGNOSIS — E782 Mixed hyperlipidemia: Secondary | ICD-10-CM

## 2018-08-16 DIAGNOSIS — I2510 Atherosclerotic heart disease of native coronary artery without angina pectoris: Secondary | ICD-10-CM

## 2018-08-16 DIAGNOSIS — I10 Essential (primary) hypertension: Secondary | ICD-10-CM

## 2018-08-16 DIAGNOSIS — R002 Palpitations: Secondary | ICD-10-CM

## 2018-08-16 DIAGNOSIS — Z952 Presence of prosthetic heart valve: Secondary | ICD-10-CM

## 2018-08-21 DIAGNOSIS — E1042 Type 1 diabetes mellitus with diabetic polyneuropathy: Secondary | ICD-10-CM | POA: Diagnosis not present

## 2018-10-02 DIAGNOSIS — R69 Illness, unspecified: Secondary | ICD-10-CM | POA: Diagnosis not present

## 2018-10-14 DIAGNOSIS — R69 Illness, unspecified: Secondary | ICD-10-CM | POA: Diagnosis not present

## 2018-11-03 DIAGNOSIS — E039 Hypothyroidism, unspecified: Secondary | ICD-10-CM | POA: Diagnosis not present

## 2018-11-03 DIAGNOSIS — E1042 Type 1 diabetes mellitus with diabetic polyneuropathy: Secondary | ICD-10-CM | POA: Diagnosis not present

## 2018-11-19 NOTE — Progress Notes (Deleted)
CARDIOLOGY OFFICE NOTE  Date:  11/19/2018    Jaclyn [name] Date of Birth: 1945/05/13 Medical Record #[id number]  PCP:  Lowella Dandy, NP  Cardiologist:  Gillian Shields    No chief complaint on file.   History of Present Illness: Jaclyn [name] is a 73 y.o. female who presents today for a 6 month check. Seen for Dr. Johnsie Cancel. Primarily follows with me.   She has a history of AVRand CAD with prior CABG x 3 in 2013. Other issues include carotid disease, HTN, PAD, HLD and DM.She has had remote lung cancer - treated with radiation/chemo back in the late 90's.She is not able to tolerate statins.  Last seen by Dr. Johnsie Cancel in 2016.Was then not seenin the office until 9/2018by Cecilie Kicks, NP. Was felt to be doing well at that time but was not taking her statin- this was restarted.I have followed her since. She has had issues with tolerating medicines. Last echo updated in 2019. Have tried to start Zetia. Last seen by telehealth visit back in April - doing ok - on no lipid lowering medicines.   The patient {does/does not:200015} have symptoms concerning for COVID-19 infection (fever, chills, cough, or new shortness of breath).   Comes in today. Here with   Past Medical History:  Diagnosis Date  . Allergic rhinitis   . Anemia    blood transfusion- 6 yrs. ago by Dr. Ralene Ok   . Anxiety   . Aortic stenosis    a. 09/11/2011 s/p AVR - 80mm Edwards Magna Ease Pericardial Tissue Valve (with CABG x 3 as above).  . Arthritis    knees  . Coronary artery disease    a. 09/11/2011 CABGx 3: LIMA->LAD, VG->D1, VG->PLV (with AVR - 47mm Edwards Magna Ease Pericardial Tissue Valve).  . Depression   . Dermatitis   . Diastolic CHF ([place])    a. EF 67-67%, grade 2 diastolic dysfunction by echo 06/2011  . DM (diabetes mellitus) ([place])    type I - treated with insulin pump /w x20yrs  . Fibrocystic disease of breast   . Foot pain   . Gastroparesis   . GERD (gastroesophageal  reflux disease)   . Hematuria   . HTN (hypertension)    a. 09/2011 BP's running low (in setting of weight loss and elimination of salt from diet).  . Hyperlipidemia   . Hypothyroidism   . Keratosis   . 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 Menopause   . Microalbuminuria   . Orthostatic hypotension   . Osteoporosis   . Peripheral neuropathy   . Plantar fasciitis   . 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 S/P radiation therapy    Radiation therapy to chest and mediastinum for small cell carcinoma of the lung  . Seizures ([place])    33 yrs. ago, was on phenobarbital for sometime, off it for 20 yrs.   . Vitamin D deficiency     Past Surgical History:  Procedure Laterality Date  . AORTIC VALVE REPLACEMENT  09/11/2011   21 mm [facility] Ease pericardial tissue valve  . CARPAL TUNNEL RELEASE     bilateral  . CORONARY ARTERY BYPASS GRAFT  09/11/2011   LIMA to LAD, SVG to D1, SVG to RPL  . ELBOW SURGERY     bilateral  . MULTIPLE EXTRACTIONS WITH ALVEOLOPLASTY  08/29/2011  Procedure: MULTIPLE EXTRACION WITH ALVEOLOPLASTY;  Surgeon: Lenn Cal, DDS;  Location: [facility];  Service: Oral Surgery;  Laterality: N/A;  extraction of teeth # 22, 27 with alveoloplasty  . SHOULDER SURGERY     bilateral- RCR-   . THYROIDECTOMY     partial- R   . TOTAL ABDOMINAL HYSTERECTOMY       Medications: No outpatient medications have been marked as taking for the 11/24/18 encounter (Appointment) with Burtis Junes, NP.     Allergies: Allergies  Allergen Reactions  . Statins     Joints ache...crestor...lipitor...crestor  . Codeine Other (See Comments)    Can't wake up    Social History: The patient  reports that she quit smoking about 21 years ago. Her smoking use included cigarettes. She has a 86.00 pack-year smoking history. She has never used smokeless tobacco. She reports that she does not drink alcohol  or use drugs.   Family History: The patient's ***family history includes Cancer in her brother and father; Coronary artery disease (age of onset: 32) in her brother; Heart failure in her mother; Hypertension in her mother; Lymphoma in her brother; Thyroid disease in her mother.   Review of Systems: Please see the history of present illness.   All other systems are reviewed and negative.   Physical Exam: VS:  There were no vitals taken for this visit. Marland Kitchen  BMI There is no height or weight on file to calculate BMI.  Wt Readings from Last 3 Encounters:  05/18/18 184 lb (83.5 kg)  12/01/17 185 lb 6.4 oz (84.1 kg)  11/04/17 187 lb 12.8 oz (85.2 kg)    General: Pleasant. Well developed, well nourished and in no acute distress.   HEENT: Normal.  Neck: Supple, no JVD, carotid bruits, or masses noted.  Cardiac: ***Regular rate and rhythm. No murmurs, rubs, or gallops. No edema.  Respiratory:  Lungs are clear to auscultation bilaterally with normal work of breathing.  GI: Soft and nontender.  MS: No deformity or atrophy. Gait and ROM intact.  Skin: Warm and dry. Color is normal.  Neuro:  Strength and sensation are intact and no gross focal deficits noted.  Psych: Alert, appropriate and with normal affect.   LABORATORY DATA:  EKG:  EKG {ACTION; IS/IS QQV:[id number]} ordered today. This demonstrates ***.  Lab Results  Component Value Date   WBC 7.8 10/15/2016   HGB 12.4 10/15/2016   HCT 37.9 10/15/2016   PLT 324 10/15/2016   GLUCOSE 62 (L) 12/01/2017   ALT 10 09/09/2011   AST 17 09/09/2011   NA 141 12/01/2017   K 4.7 12/01/2017   CL 103 12/01/2017   CREATININE 1.20 (H) 12/01/2017   BUN 23 12/01/2017   CO2 24 12/01/2017   INR 1.16 09/21/2011   HGBA1C 6.8 (H) 09/09/2011     BNP (last 3 results) No results for input(s): BNP in the last 8760 hours.  ProBNP (last 3 results) No results for input(s): PROBNP in the last 8760 hours.   Other Studies Reviewed Today:  EchoStudy  Conclusions10/2019  - Left ventricle: The cavity size was normal. Systolic function was normal. The estimated ejection fraction was in the range of 55% to 60%. Wall motion was normal; there were no regional wall motion abnormalities. Features are consistent with a pseudonormal left ventricular filling pattern, with concomitant abnormal relaxation and increased filling pressure (grade 2 diastolic dysfunction). Doppler parameters are consistent with high ventricular filling pressure. - Aortic valve: A bioprosthesis was present and functioning  normally. Transvalvular velocity was within the normal range. There was no stenosis. There was no regurgitation. Mean gradient (S): 12 mm Hg. - Mitral valve: Transvalvular velocity was within the normal range. There was no evidence for stenosis. There was mild regurgitation. - Right ventricle: The cavity size was normal. Wall thickness was normal. Systolic function was normal. - Atrial septum: No defect or patent foramen ovale was identified by color flow Doppler. - Tricuspid valve: There was mild regurgitation. - Pulmonary arteries: Systolic pressure was within the normal range. PA peak pressure: 32 mm Hg (S).    Cardiac MonitorStudy Highlights9/2018  NSR Average HR 80 bpm Rare PAC;s / PVC;s Symptoms do not correlate with any arrhythmia     ASSESSMENT & PLAN:    1. CAD with prior CABG/AVR-her last echo from 2019 showed her aortic valve was working ok. Needs CV risk factor modification.Reminded about SBE. Encouraged continued activity.   2. DM- Has a new insulin pump in place per her report - followed by Endocrine.  3. HTN- BP for the most part is ok - she remains on low dose ARB - she notes that taking more tends to make her feel bad - she will continue to monitor.   4. HLD- statin intolerant - does not wish to entertain trying something new. Will discuss on return visit in person.    5. Carotid disease - last study from 11/2016 with 1 to 39% bilateral disease noted.Consider repeating later in 2020 - will discuss arranging on return visit.  6. Diastolic dysfunction noted on most recent echo from 2019 - needs good BP control and salt restriction. She is aware.  7. COVID-19 Education: The signs and symptoms of COVID-19 were discussed with the patient and how to seek care for testing (follow up with PCP or arrange E-visit).  The importance of social distancing, staying at home, hand hygiene and wearing a mask when out in public were discussed today.  Current medicines are reviewed with the patient today.  The patient does not have concerns regarding medicines other than what has been noted above.  The following changes have been made:  See above.  Labs/ tests ordered today include:   No orders of the defined types were placed in this encounter.    Disposition:   FU with *** in {gen number 4-78:295621} {Days to years:10300}.   Patient is agreeable to this plan and will call if any problems develop in the interim.   SignedTruitt Merle, NP  11/19/2018 8:03 PM  [facility] [address] [place], [place]  [address] Phone: [phone] Fax: [phone]

## 2018-11-23 ENCOUNTER — Telehealth: Payer: Self-pay | Admitting: Cardiovascular Disease

## 2018-11-23 NOTE — Telephone Encounter (Signed)
° ° ° °  Patient request to start being seen by an  [facility] provider  due to location and lack of transportation.

## 2018-11-23 NOTE — Telephone Encounter (Signed)
That's fine

## 2018-11-24 ENCOUNTER — Ambulatory Visit: Payer: Medicare HMO | Admitting: Nurse Practitioner

## 2018-12-08 DIAGNOSIS — R69 Illness, unspecified: Secondary | ICD-10-CM | POA: Diagnosis not present

## 2018-12-30 DIAGNOSIS — R69 Illness, unspecified: Secondary | ICD-10-CM | POA: Diagnosis not present

## 2019-01-12 ENCOUNTER — Encounter: Payer: Self-pay | Admitting: Cardiology

## 2019-01-12 DIAGNOSIS — Z789 Other specified health status: Secondary | ICD-10-CM

## 2019-01-12 HISTORY — DX: Other specified health status: Z78.9

## 2019-01-14 ENCOUNTER — Ambulatory Visit: Payer: Medicare HMO | Admitting: Cardiology

## 2019-01-21 DIAGNOSIS — G720 Drug-induced myopathy: Secondary | ICD-10-CM | POA: Insufficient documentation

## 2019-01-21 DIAGNOSIS — T466X5A Adverse effect of antihyperlipidemic and antiarteriosclerotic drugs, initial encounter: Secondary | ICD-10-CM | POA: Insufficient documentation

## 2019-01-21 HISTORY — DX: Drug-induced myopathy: G72.0

## 2019-01-21 HISTORY — DX: Adverse effect of antihyperlipidemic and antiarteriosclerotic drugs, initial encounter: T46.6X5A

## 2019-02-02 DIAGNOSIS — E1042 Type 1 diabetes mellitus with diabetic polyneuropathy: Secondary | ICD-10-CM | POA: Diagnosis not present

## 2019-02-02 DIAGNOSIS — E559 Vitamin D deficiency, unspecified: Secondary | ICD-10-CM | POA: Diagnosis not present

## 2019-02-19 ENCOUNTER — Ambulatory Visit: Payer: Medicare HMO | Admitting: Cardiology

## 2019-05-14 ENCOUNTER — Other Ambulatory Visit: Payer: Self-pay

## 2019-05-16 NOTE — Progress Notes (Deleted)
Cardiology Office Note:    Date:  05/17/2019   ID:  Jaclyn [name], DOB February 17, 1945, MRN [id number]  PCP:  Lowella Dandy, NP  Cardiologist:  Shirlee More, MD    Referring MD: Lowella Dandy, NP    ASSESSMENT:    1. Coronary artery disease involving native heart without angina pectoris, unspecified vessel or lesion type   2. S/P AVR (aortic valve replacement)   3. Essential hypertension   4. Other hyperlipidemia   5. Statin intolerance    PLAN:    In order of problems listed above:  1. ***   Next appointment: ***   Medication Adjustments/Labs and Tests Ordered: Current medicines are reviewed at length with the patient today.  Concerns regarding medicines are outlined above.  No orders of the defined types were placed in this encounter.  No orders of the defined types were placed in this encounter.   No chief complaint on file.   History of Present Illness:    Jaclyn [name] is a 74 y.o. female with a hx of a bioprosthetic AVR for aortic stenosis  and CAD with prior CABG x 3 in 2013. Other issues include carotid disease, HTN, PAD, HLD and DM. She has had remote lung cancer - treated with radiation/chemo back in the late 90's. She is not able to tolerate statins. Her echo 11/29/2017 showed normal AVR function.  Her last carotid duplex May 2015 showed 40 to 59% right internal carotid stenosis 1 to 39% left internal carotid artery stenosis. She was last seen 05/18/2018. Compliance with diet, lifestyle and medications: ***  Past Medical History:  Diagnosis Date  . Abnormal mammogram of both breasts   . Acute rhinosinusitis   . Acute upper respiratory infection, unspecified   . Allergic rhinitis   . Allergic rhinitis   . Anemia    blood transfusion- 6 yrs. ago by Dr. Ralene Ok   . Anxiety   . Aortic stenosis    a. 09/11/2011 s/p AVR - 63mm Edwards Magna Ease Pericardial Tissue Valve (with CABG x 3 as above).  . Aortic stenosis, severe 08/22/2011  . Arthritis    knees  . AS (aortic stenosis) 08/28/2011  . Benign essential HTN 08/07/2015  . Body mass index 30.0-30.9, adult   . CAD in native artery   . Carotid bruit 06/13/2011  . Carotid stenosis, right   . Chest pain   . Chronic constipation   . Coronary artery disease    a. 09/11/2011 CABGx 3: LIMA->LAD, VG->D1, VG->PLV (with AVR - 31mm Edwards Magna Ease Pericardial Tissue Valve).  . Coronary atherosclerosis of native coronary artery 08/22/2011  . Depression   . Dermatitis   . Diastolic CHF ([place])    a. EF 12-45%, grade 2 diastolic dysfunction by echo 06/2011  . DM (diabetes mellitus) ([place])    type I - treated with insulin pump /w x58yrs  . Essential hypertension, benign   . Fatigue   . Fibrocystic disease of breast   . Foot pain   . Gastroparesis   . GERD (gastroesophageal reflux disease)   . Hair loss   . Hematuria   . History of lung cancer   . HTN (hypertension)    a. 09/2011 BP's running low (in setting of weight loss and elimination of salt from diet).  . Hypercalcemia 08/07/2015  . Hyperlipidemia   . Hyperlipidemia LDL goal <70   . Hypotension 09/24/2011  . Hypothyroidism   . Hypothyroidism in adult   . Joint pain   .  Keratosis   . 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 Mass of both breasts on mammogram   . Menopausal disorder   . Menopause   . Microalbuminuria   . Murmur   . Myalgia   . Orthostatic hypotension   . Osteoporosis   . Palpitations 06/13/2011  . Peripheral neuropathy   . Personal history of fall   . Plantar fasciitis   . Positive depression screening   . 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 S/P aortic valve replacement with bioprosthetic valve 09/11/2011   21 mm [facility] Ease pericardial tissue valve   . S/P AVR (aortic valve replacement) 10/07/2011  . S/P CABG x 3 09/11/2011   Free LIMA to LAD, SVG to D1, SVG to RPL, EVH via right thigh and leg   . S/P radiation therapy     Radiation therapy to chest and mediastinum for small cell carcinoma of the lung  . Screening for alcoholism   . Seasonal allergies   . Seizures ([place])    33 yrs. ago, was on phenobarbital for sometime, off it for 20 yrs.   . SOB (shortness of breath)   . Special screening for malignant neoplasms, colon   . Statin intolerance 01/12/2019  . Statin myopathy 01/21/2019  . 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) 08/07/2015  . 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 Varicose veins of left lower extremity with pain   . Vertigo   . Visit for screening mammogram   . Vitamin D deficiency    Past Medical History:  Diagnosis Date  . Abnormal mammogram of both breasts   . Acute rhinosinusitis   . Acute upper respiratory infection, unspecified   . Allergic rhinitis   . Allergic rhinitis   . Anemia    blood transfusion- 6 yrs. ago by Dr. Ralene Ok   . Anxiety   . Aortic stenosis    a. 09/11/2011 s/p AVR - 80mm Edwards Magna Ease Pericardial Tissue Valve (with CABG x 3 as above).  . Aortic stenosis, severe 08/22/2011  . Arthritis    knees  . AS (aortic stenosis) 08/28/2011  . Benign essential HTN 08/07/2015  . Body mass index 30.0-30.9, adult   . CAD in native artery   . Carotid bruit 06/13/2011  . Carotid stenosis, right   . Chest pain   . Chronic constipation   . Coronary artery disease    a. 09/11/2011 CABGx 3: LIMA->LAD, VG->D1, VG->PLV (with AVR - 25mm Edwards Magna Ease Pericardial Tissue Valve).  . Coronary atherosclerosis of native coronary artery 08/22/2011  . Depression   . Dermatitis   . Diastolic CHF ([place])    a. EF 01-02%, grade 2 diastolic dysfunction by echo 06/2011  . DM (diabetes mellitus) ([place])    type I - treated with insulin pump /w x31yrs  . Essential hypertension, benign   . Fatigue   . Fibrocystic disease of breast   . Foot pain   . Gastroparesis   . GERD (gastroesophageal reflux disease)   . Hair loss   . Hematuria     . History of lung cancer   . HTN (hypertension)    a. 09/2011 BP's running low (in setting of weight loss and elimination of salt from diet).  . Hypercalcemia 08/07/2015  . Hyperlipidemia   . Hyperlipidemia LDL goal <70   .  Hypotension 09/24/2011  . Hypothyroidism   . Hypothyroidism in adult   . Joint pain   . Keratosis   . 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 Mass of both breasts on mammogram   . Menopausal disorder   . Menopause   . Microalbuminuria   . Murmur   . Myalgia   . Orthostatic hypotension   . Osteoporosis   . Palpitations 06/13/2011  . Peripheral neuropathy   . Personal history of fall   . Plantar fasciitis   . Positive depression screening   . 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 S/P aortic valve replacement with bioprosthetic valve 09/11/2011   21 mm [facility] Ease pericardial tissue valve   . S/P AVR (aortic valve replacement) 10/07/2011  . S/P CABG x 3 09/11/2011   Free LIMA to LAD, SVG to D1, SVG to RPL, EVH via right thigh and leg   . S/P radiation therapy    Radiation therapy to chest and mediastinum for small cell carcinoma of the lung  . Screening for alcoholism   . Seasonal allergies   . Seizures ([place])    33 yrs. ago, was on phenobarbital for sometime, off it for 20 yrs.   . SOB (shortness of breath)   . Special screening for malignant neoplasms, colon   . Statin intolerance 01/12/2019  . Statin myopathy 01/21/2019  . Type 1 diabetes mellitus with diabetic polyneuropathy ([place]) 08/07/2015  . 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 Varicose veins of left lower extremity with pain   . Vertigo   . Visit for screening mammogram   . Vitamin D deficiency     Past Surgical History:  Procedure Laterality Date  . AORTIC VALVE REPLACEMENT  09/11/2011   21 mm [facility] Ease pericardial tissue valve  . CARPAL TUNNEL RELEASE     bilateral   . CATARACT EXTRACTION, BILATERAL Bilateral   . CORONARY ARTERY BYPASS GRAFT  09/11/2011   LIMA to LAD, SVG to D1, SVG to RPL  . ELBOW SURGERY     bilateral  . MULTIPLE EXTRACTIONS WITH ALVEOLOPLASTY  08/29/2011   Procedure: MULTIPLE EXTRACION WITH ALVEOLOPLASTY;  Surgeon: Lenn Cal, DDS;  Location: [place];  Service: Oral Surgery;  Laterality: N/A;  extraction of teeth # 22, 27 with alveoloplasty  . SHOULDER SURGERY     bilateral- RCR-   . THYROIDECTOMY     partial- R   . TOTAL ABDOMINAL HYSTERECTOMY      Current Medications: Current Meds  Medication Sig  . [DISCONTINUED] insulin NPH-regular Human (70-30) 100 UNIT/ML injection Inject into the skin. Via insulin pump     Allergies:   Statins and Codeine   Social History   Socioeconomic History  . Marital status: Divorced    Spouse name: Not on file  . Number of children: 2  . Years of education: Not on file  . Highest education level: Not on file  Occupational History  . Not on file  Tobacco Use  . Smoking status: Former Smoker    Packs/day: 2.00    Years: 43.00    Pack years: 86.00    Types: Cigarettes    Quit date: 08/27/1997    Years since quitting: 21.7  . Smokeless tobacco: Never Used  Substance and Sexual Activity  . Alcohol use: No  . Drug use:  No  . Sexual activity: Not on file  Other Topics Concern  . Not on file  Social History Narrative   The patient is divorced.   The patient has 2 daughters. Patient used to smoke between 1-2.5 packs per day for 43 years.    Patient quit smoking 12 years ago approximately 2001 after her lung cancer diagnosis.   Social Determinants of Health   Financial Resource Strain:   . Difficulty of Paying Living Expenses:   Food Insecurity:   . Worried About Charity fundraiser in the Last Year:   . Arboriculturist in the Last Year:   Transportation Needs:   . Film/video editor (Medical):   Marland Kitchen Lack of Transportation (Non-Medical):   Physical Activity:   . Days of  Exercise per Week:   . Minutes of Exercise per Session:   Stress:   . Feeling of Stress :   Social Connections:   . Frequency of Communication with Friends and Family:   . Frequency of Social Gatherings with Friends and Family:   . Attends Religious Services:   . Active Member of Clubs or Organizations:   . Attends Archivist Meetings:   Marland Kitchen Marital Status:      Family History: The patient's ***family history includes Alcohol abuse in her brother; Cancer in her brother and father; Coronary artery disease (age of onset: 55) in her brother; Diabetes Mellitus I in her brother and sister; Heart attack in her brother; Heart disease in her brother, mother, and sister; Heart failure in her mother; Hypertension in her mother; Lymphoma in her brother; Stroke in her sister; Throat cancer in her brother and sister; Thyroid disease in her mother. ROS:   Please see the history of present illness.    All other systems reviewed and are negative.  EKGs/Labs/Other Studies Reviewed:    The following studies were reviewed today:  EKG:  EKG ordered today and personally reviewed.  The ekg ordered today demonstrates ***  Recent Labs: No results found for requested labs within last 8760 hours.  Recent Lipid Panel No results found for: CHOL, TRIG, HDL, CHOLHDL, VLDL, LDLCALC, LDLDIRECT  Physical Exam:    VS:  There were no vitals taken for this visit.    Wt Readings from Last 3 Encounters:  05/18/18 184 lb (83.5 kg)  12/01/17 185 lb 6.4 oz (84.1 kg)  11/04/17 187 lb 12.8 oz (85.2 kg)     GEN: *** Well nourished, well developed in no acute distress HEENT: Normal NECK: No JVD; No carotid bruits LYMPHATICS: No lymphadenopathy CARDIAC: ***RRR, no murmurs, rubs, gallops RESPIRATORY:  Clear to auscultation without rales, wheezing or rhonchi  ABDOMEN: Soft, non-tender, non-distended MUSCULOSKELETAL:  No edema; No deformity  SKIN: Warm and dry NEUROLOGIC:  Alert and oriented x  3 PSYCHIATRIC:  Normal affect    Signed, Shirlee More, MD  05/17/2019 7:29 AM    [facility]

## 2019-05-17 ENCOUNTER — Ambulatory Visit: Payer: Medicare HMO | Admitting: Cardiology

## 2019-05-21 LAB — COLOGUARD: COLOGUARD: NEGATIVE

## 2019-09-01 NOTE — Progress Notes (Deleted)
Cardiology Office Note:    Date:  09/01/2019   ID:  Jaclyn [name], DOB 12/06/1945, MRN [id number]  PCP:  Lowella Dandy, NP  Cardiologist:  Shirlee More, MD    Referring MD: Lowella Dandy, NP    ASSESSMENT:    1. S/P AVR (aortic valve replacement)   2. Coronary artery disease involving native heart without angina pectoris, unspecified vessel or lesion type   3. Essential hypertension   4. Other hyperlipidemia   5. Carotid artery disease, unspecified laterality ([place])    PLAN:    In order of problems listed above:  1. ***   Next appointment: ***   Medication Adjustments/Labs and Tests Ordered: Current medicines are reviewed at length with the patient today.  Concerns regarding medicines are outlined above.  No orders of the defined types were placed in this encounter.  No orders of the defined types were placed in this encounter.   No chief complaint on file.   History of Present Illness:    Jaclyn [name] is a 74 y.o. female with a hx of CAD and aortic stenosis with CABG and aortic valve replacement as well as hypertension hyperlipidemia and carotid disease. She previously been seen by my group in the [place] office most recent by Pedro Earls.  She has a history of aortic valve replacement surgical and CAD with bypass surgery 2013 other problems include carotid stenosis hypertension hyperlipidemia diabetes hypertension and lung cancer with radiation and chemotherapy in the 1990s.  Surgery was 09/11/2011 with a #21 Edwards magna ease pericardial tissue valve and bypass surgery with a left thoracic artery anastomosis to left anterior descending vein graft to the diagonal vein graft to the posterior left ventricular branch. Compliance with diet, lifestyle and medications: ***  Her last echocardiogram was 11/19/2017 showing normal left ventricular ejection fraction 55 to 60% with elevated filling pressures and pseudonormal mitral valve inflow.   Aortic valve prosthesis was functioning normally with mean gradient of 12 mm and no aortic regurgitation.  Carotid duplex performed 12/07/2006 teen showed stenosis greater than 50% in the common carotid artery on the right 1- 39% ICA stenosis and on the left 1 - 39% left ICA stenosis. Past Medical History:  Diagnosis Date  . Abnormal mammogram of both breasts   . Acute rhinosinusitis   . Acute upper respiratory infection, unspecified   . Allergic rhinitis   . Allergic rhinitis   . Anemia    blood transfusion- 6 yrs. ago by Dr. Ralene Ok   . Anxiety   . Aortic stenosis    a. 09/11/2011 s/p AVR - 48mm Edwards Magna Ease Pericardial Tissue Valve (with CABG x 3 as above).  . Aortic stenosis, severe 08/22/2011  . Arthritis    knees  . AS (aortic stenosis) 08/28/2011  . Benign essential HTN 08/07/2015  . Body mass index 30.0-30.9, adult   . CAD in native artery   . Carotid bruit 06/13/2011  . Carotid stenosis, right   . Chest pain   . Chronic constipation   . Coronary artery disease    a. 09/11/2011 CABGx 3: LIMA->LAD, VG->D1, VG->PLV (with AVR - 74mm Edwards Magna Ease Pericardial Tissue Valve).  . Coronary atherosclerosis of native coronary artery 08/22/2011  . Depression   . Dermatitis   . Diastolic CHF ([place])    a. EF 52-77%, grade 2 diastolic dysfunction by echo 06/2011  . DM (diabetes mellitus) ([place])    type I - treated with insulin pump /w x72yrs  .  Essential hypertension, benign   . Fatigue   . Fibrocystic disease of breast   . Foot pain   . Gastroparesis   . GERD (gastroesophageal reflux disease)   . Hair loss   . Hematuria   . History of lung cancer   . HTN (hypertension)    a. 09/2011 BP's running low (in setting of weight loss and elimination of salt from diet).  . Hypercalcemia 08/07/2015  . Hyperlipidemia   . Hyperlipidemia LDL goal <70   . Hypotension 09/24/2011  . Hypothyroidism   . Hypothyroidism in adult   . Joint pain   . Keratosis   . Lung cancer ([place])    Small  Cell carcinoma of the lung treated with chemo + radiation therapy to mediastinum and chest   . Mass of both breasts on mammogram   . Menopausal disorder   . Menopause   . Microalbuminuria   . Murmur   . Myalgia   . Orthostatic hypotension   . Osteoporosis   . Palpitations 06/13/2011  . Peripheral neuropathy   . Personal history of fall   . Plantar fasciitis   . Positive depression screening   . 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 S/P aortic valve replacement with bioprosthetic valve 09/11/2011   21 mm [facility] Ease pericardial tissue valve   . S/P AVR (aortic valve replacement) 10/07/2011  . S/P CABG x 3 09/11/2011   Free LIMA to LAD, SVG to D1, SVG to RPL, EVH via right thigh and leg   . S/P radiation therapy    Radiation therapy to chest and mediastinum for small cell carcinoma of the lung  . Screening for alcoholism   . Seasonal allergies   . Seizures ([place])    33 yrs. ago, was on phenobarbital for sometime, off it for 20 yrs.   . SOB (shortness of breath)   . Special screening for malignant neoplasms, colon   . Statin intolerance 01/12/2019  . Statin myopathy 01/21/2019  . Type 1 diabetes mellitus with diabetic polyneuropathy ([place]) 08/07/2015  . 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 Varicose veins of left lower extremity with pain   . Vertigo   . Visit for screening mammogram   . Vitamin D deficiency     Past Surgical History:  Procedure Laterality Date  . AORTIC VALVE REPLACEMENT  09/11/2011   21 mm [facility] Ease pericardial tissue valve  . CARPAL TUNNEL RELEASE     bilateral  . CATARACT EXTRACTION, BILATERAL Bilateral   . CORONARY ARTERY BYPASS GRAFT  09/11/2011   LIMA to LAD, SVG to D1, SVG to RPL  . ELBOW SURGERY     bilateral  . MULTIPLE EXTRACTIONS WITH ALVEOLOPLASTY  08/29/2011   Procedure: MULTIPLE EXTRACION WITH ALVEOLOPLASTY;  Surgeon: Lenn Cal, DDS;  Location: [place];  Service: Oral Surgery;  Laterality: N/A;   extraction of teeth # 22, 27 with alveoloplasty  . SHOULDER SURGERY     bilateral- RCR-   . THYROIDECTOMY     partial- R   . TOTAL ABDOMINAL HYSTERECTOMY      Current Medications: No outpatient medications have been marked as taking for the 09/02/19 encounter (Appointment) with Richardo Priest, MD.     Allergies:   Statins and Codeine   Social History   Socioeconomic History  . Marital status: Divorced    Spouse name: Not on file  . Number of children: 2  . Years  of education: Not on file  . Highest education level: Not on file  Occupational History  . Not on file  Tobacco Use  . Smoking status: Former Smoker    Packs/day: 2.00    Years: 43.00    Pack years: 86.00    Types: Cigarettes    Quit date: 08/27/1997    Years since quitting: 22.0  . Smokeless tobacco: Never Used  Vaping Use  . Vaping Use: Never used  Substance and Sexual Activity  . Alcohol use: No  . Drug use: No  . Sexual activity: Not on file  Other Topics Concern  . Not on file  Social History Narrative   The patient is divorced.   The patient has 2 daughters. Patient used to smoke between 1-2.5 packs per day for 43 years.    Patient quit smoking 12 years ago approximately 2001 after her lung cancer diagnosis.   Social Determinants of Health   Financial Resource Strain:   . Difficulty of Paying Living Expenses:   Food Insecurity:   . Worried About Charity fundraiser in the Last Year:   . Arboriculturist in the Last Year:   Transportation Needs:   . Film/video editor (Medical):   Marland Kitchen Lack of Transportation (Non-Medical):   Physical Activity:   . Days of Exercise per Week:   . Minutes of Exercise per Session:   Stress:   . Feeling of Stress :   Social Connections:   . Frequency of Communication with Friends and Family:   . Frequency of Social Gatherings with Friends and Family:   . Attends Religious Services:   . Active Member of Clubs or Organizations:   . Attends Archivist  Meetings:   Marland Kitchen Marital Status:      Family History: The patient's ***family history includes Alcohol abuse in her brother; Cancer in her brother and father; Coronary artery disease (age of onset: 69) in her brother; Diabetes Mellitus I in her brother and sister; Heart attack in her brother; Heart disease in her brother, mother, and sister; Heart failure in her mother; Hypertension in her mother; Lymphoma in her brother; Stroke in her sister; Throat cancer in her brother and sister; Thyroid disease in her mother. ROS:   Please see the history of present illness.    All other systems reviewed and are negative.  EKGs/Labs/Other Studies Reviewed:    The following studies were reviewed today:  EKG:  EKG ordered today and personally reviewed.  The ekg ordered today demonstrates ***  Recent Labs: No results found for requested labs within last 8760 hours.  Recent Lipid Panel No results found for: CHOL, TRIG, HDL, CHOLHDL, VLDL, LDLCALC, LDLDIRECT  Physical Exam:    VS:  There were no vitals taken for this visit.    Wt Readings from Last 3 Encounters:  05/18/18 184 lb (83.5 kg)  12/01/17 185 lb 6.4 oz (84.1 kg)  11/04/17 187 lb 12.8 oz (85.2 kg)     GEN: *** Well nourished, well developed in no acute distress HEENT: Normal NECK: No JVD; No carotid bruits LYMPHATICS: No lymphadenopathy CARDIAC: ***RRR, no murmurs, rubs, gallops RESPIRATORY:  Clear to auscultation without rales, wheezing or rhonchi  ABDOMEN: Soft, non-tender, non-distended MUSCULOSKELETAL:  No edema; No deformity  SKIN: Warm and dry NEUROLOGIC:  Alert and oriented x 3 PSYCHIATRIC:  Normal affect    Signed, Shirlee More, MD  09/01/2019 12:06 PM    [place]

## 2019-09-02 ENCOUNTER — Ambulatory Visit: Payer: Medicare HMO | Admitting: Cardiology

## 2019-09-02 ENCOUNTER — Encounter: Payer: Self-pay | Admitting: Cardiology

## 2019-09-02 ENCOUNTER — Other Ambulatory Visit: Payer: Self-pay

## 2019-09-02 VITALS — BP 80/48 | HR 93 | Ht 66.0 in | Wt 177.0 lb

## 2019-09-02 DIAGNOSIS — I2510 Atherosclerotic heart disease of native coronary artery without angina pectoris: Secondary | ICD-10-CM | POA: Diagnosis not present

## 2019-09-02 DIAGNOSIS — I10 Essential (primary) hypertension: Secondary | ICD-10-CM

## 2019-09-02 DIAGNOSIS — Z952 Presence of prosthetic heart valve: Secondary | ICD-10-CM

## 2019-09-02 DIAGNOSIS — Z789 Other specified health status: Secondary | ICD-10-CM | POA: Diagnosis not present

## 2019-09-02 DIAGNOSIS — E782 Mixed hyperlipidemia: Secondary | ICD-10-CM

## 2019-09-02 DIAGNOSIS — E1042 Type 1 diabetes mellitus with diabetic polyneuropathy: Secondary | ICD-10-CM

## 2019-09-02 MED ORDER — REPATHA SURECLICK 140 MG/ML ~~LOC~~ SOAJ: 140.0000 mg | SUBCUTANEOUS | 6 refills | Status: DC

## 2019-09-02 NOTE — Progress Notes (Signed)
Cardiology Office Note:    Date:  09/02/2019   ID:  Jaclyn [name], DOB 01-03-1946, MRN [id number]  PCP:  Lowella Dandy, NP  Cardiologist:  Shirlee More, MD    Referring MD: Lowella Dandy, NP    ASSESSMENT:    1. Coronary artery disease involving native heart without angina pectoris, unspecified vessel or lesion type   2. Statin intolerance   3. S/P AVR (aortic valve replacement)   4. Mixed hyperlipidemia   5. Benign essential HTN   6. Type 1 diabetes mellitus with diabetic polyneuropathy (HCC)    PLAN:    In order of problems listed above:  1. Stable CAD but high risk of recurrent ischemia needs lipid-lowering therapy agrees to once to PCS K9 inhibitor and reassess if LDL remains greater than 70 add Zetia she told me she do not think she ever took in the past and could tolerate no dose of statin previously  2. Stable after aortic valve replacement she will need an echocardiogram 2023 and then yearly afterwards to screen for valve dysfunction continue low-dose aspirin 81 mg 3. Poorly controlled hypertension not on statin start PCSK9 4. Stable hypertension repeat blood pressure by me 110/80 5. Well-controlled type 1 diabetes with an insulin pump A1c at target   Next appointment: 6 months she relocated to the [place] office   Medication Adjustments/Labs and Tests Ordered: Current medicines are reviewed at length with the patient today.  Concerns regarding medicines are outlined above.  Orders Placed This Encounter  Procedures  . Lipid Profile  . EKG 12-Lead   Meds ordered this encounter  Medications  . Evolocumab (REPATHA SURECLICK) 007 MG/ML SOAJ    Sig: Inject 140 mg into the skin every 14 (fourteen) days.    Dispense:  2 pen    Refill:  6    Chief Complaint  Patient presents with  . Follow-up  . Coronary Artery Disease    History of Present Illness:    Jaclyn [name] is a 74 y.o. female with a hx of CAD CABG and surgical aortic valve replacement  bioprosthetic 2013 type 1 diabetes 50 years pretension and dyslipidemia statin intolerant last seen April 2020 virtual visit. Compliance with diet, lifestyle and medications: Yes  She continues to do well from a cardiology perspective no fever chills shortness of breath chest pain palpitation or syncope.  She had echocardiogram performed October 2019 with normal prosthetic valve function ejection fraction and had a duplex of her carotids 2018 with mild 1 to 39% bilateral ICA stenosis.  I am surprised she is on no lipid-lowering treatment.  Labs from [facility] endocrinology 05/25/2019 cholesterol 267 LDL 163 triglyceride 102 HDL 66 non-HDL cholesterol severely elevated 201.  P at that time showed a creatinine 0.99 56 cc/min normal liver function test.  Her A1c 07/23/2019 6.5% Past Medical History:  Diagnosis Date  . Abnormal mammogram of both breasts   . Acute rhinosinusitis   . Acute upper respiratory infection, unspecified   . Allergic rhinitis   . Allergic rhinitis   . Anemia    blood transfusion- 6 yrs. ago by Dr. Ralene Ok   . Anxiety   . Aortic stenosis    a. 09/11/2011 s/p AVR - 74mm [employer] Magna Ease Pericardial Tissue Valve (with CABG x 3 as above).  . Aortic stenosis, severe 08/22/2011  . Arthritis    knees  . AS (aortic stenosis) 08/28/2011  . Benign essential HTN 08/07/2015  . Body mass index 30.0-30.9,  adult   . CAD in native artery   . Carotid bruit 06/13/2011  . Carotid stenosis, right   . Chest pain   . Chronic constipation   . Coronary artery disease    a. 09/11/2011 CABGx 3: LIMA->LAD, VG->D1, VG->PLV (with AVR - 69mm Edwards Magna Ease Pericardial Tissue Valve).  . Coronary atherosclerosis of native coronary artery 08/22/2011  . Depression   . Dermatitis   . Diastolic CHF ([place])    a. EF 51-76%, grade 2 diastolic dysfunction by echo 06/2011  . DM (diabetes mellitus) ([place])    type I - treated with insulin pump /w x94yrs  . Essential hypertension, benign   .  Fatigue   . Fibrocystic disease of breast   . Foot pain   . Gastroparesis   . GERD (gastroesophageal reflux disease)   . Hair loss   . Hematuria   . History of lung cancer   . HTN (hypertension)    a. 09/2011 BP's running low (in setting of weight loss and elimination of salt from diet).  . Hypercalcemia 08/07/2015  . Hyperlipidemia   . Hyperlipidemia LDL goal <70   . Hypotension 09/24/2011  . Hypothyroidism   . Hypothyroidism in adult   . Joint pain   . Keratosis   . 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 Mass of both breasts on mammogram   . Menopausal disorder   . Menopause   . Microalbuminuria   . Murmur   . Myalgia   . Orthostatic hypotension   . Osteoporosis   . Palpitations 06/13/2011  . Peripheral neuropathy   . Personal history of fall   . Plantar fasciitis   . Positive depression screening   . 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 S/P aortic valve replacement with bioprosthetic valve 09/11/2011   21 mm [facility] Ease pericardial tissue valve   . S/P AVR (aortic valve replacement) 10/07/2011  . S/P CABG x 3 09/11/2011   Free LIMA to LAD, SVG to D1, SVG to RPL, EVH via right thigh and leg   . S/P radiation therapy    Radiation therapy to chest and mediastinum for small cell carcinoma of the lung  . Screening for alcoholism   . Seasonal allergies   . Seizures ([place])    33 yrs. ago, was on phenobarbital for sometime, off it for 20 yrs.   . SOB (shortness of breath)   . Special screening for malignant neoplasms, colon   . Statin intolerance 01/12/2019  . Statin myopathy 01/21/2019  . Type 1 diabetes mellitus with diabetic polyneuropathy ([place]) 08/07/2015  . Type 1 diabetes mellitus with stage 3 chronic kidney disease ([place])   . Varicose veins of left lower extremity with pain   . Vertigo   . Visit for screening mammogram   . Vitamin D deficiency     Past Surgical History:  Procedure  Laterality Date  . AORTIC VALVE REPLACEMENT  09/11/2011   21 mm [facility] Ease pericardial tissue valve  . CARPAL TUNNEL RELEASE     bilateral  . CATARACT EXTRACTION, BILATERAL Bilateral   . CORONARY ARTERY BYPASS GRAFT  09/11/2011   LIMA to LAD, SVG to D1, SVG to RPL  . ELBOW SURGERY     bilateral  . MULTIPLE EXTRACTIONS WITH ALVEOLOPLASTY  08/29/2011   Procedure: MULTIPLE EXTRACION WITH ALVEOLOPLASTY;  Surgeon: Lenn Cal, DDS;  Location: [facility] OR;  Service: Oral Surgery;  Laterality: N/A;  extraction of teeth # 22, 27 with alveoloplasty  . SHOULDER SURGERY     bilateral- RCR-   . THYROIDECTOMY     partial- R   . TOTAL ABDOMINAL HYSTERECTOMY      Current Medications: Current Meds  Medication Sig  . aspirin 81 MG chewable tablet Chew 81 mg by mouth daily.  . ergocalciferol (VITAMIN D2) 1.25 MG (50000 UT) capsule Take 1 capsule by mouth once a week.  . insulin regular (NOVOLIN R,HUMULIN R) 100 units/mL injection Use as directed in insulin pump.  MDD: 65 units/day  . levothyroxine (SYNTHROID, LEVOTHROID) 112 MCG tablet Take 112 mcg by mouth daily before breakfast.   . [DISCONTINUED] aspirin 325 MG EC tablet Take 325 mg by mouth daily.     Allergies:   Statins and Codeine   Social History   Socioeconomic History  . Marital status: Divorced    Spouse name: Not on file  . Number of children: 2  . Years of education: Not on file  . Highest education level: Not on file  Occupational History  . Not on file  Tobacco Use  . Smoking status: Former Smoker    Packs/day: 2.00    Years: 43.00    Pack years: 86.00    Types: Cigarettes    Quit date: 08/27/1997    Years since quitting: 22.0  . Smokeless tobacco: Never Used  Vaping Use  . Vaping Use: Never used  Substance and Sexual Activity  . Alcohol use: No  . Drug use: No  . Sexual activity: Not on file  Other Topics Concern  . Not on file  Social History Narrative   The patient is divorced.   The patient has 2  daughters. Patient used to smoke between 1-2.5 packs per day for 43 years.    Patient quit smoking 12 years ago approximately 2001 after her lung cancer diagnosis.   Social Determinants of Health   Financial Resource Strain:   . Difficulty of Paying Living Expenses:   Food Insecurity:   . Worried About Charity fundraiser in the Last Year:   . Arboriculturist in the Last Year:   Transportation Needs:   . Film/video editor (Medical):   Marland Kitchen Lack of Transportation (Non-Medical):   Physical Activity:   . Days of Exercise per Week:   . Minutes of Exercise per Session:   Stress:   . Feeling of Stress :   Social Connections:   . Frequency of Communication with Friends and Family:   . Frequency of Social Gatherings with Friends and Family:   . Attends Religious Services:   . Active Member of Clubs or Organizations:   . Attends Archivist Meetings:   Marland Kitchen Marital Status:      Family History: The patient's family history includes Alcohol abuse in her brother; Cancer in her brother and father; Coronary artery disease (age of onset: 17) in her brother; Diabetes Mellitus I in her brother and sister; Heart attack in her brother; Heart disease in her brother, mother, and sister; Heart failure in her mother; Hypertension in her mother; Lymphoma in her brother; Stroke in her sister; Throat cancer in her brother and sister; Thyroid disease in her mother. ROS:   Please see the history of present illness.    All other systems reviewed and are negative.  EKGs/Labs/Other Studies Reviewed:    The following studies were reviewed today:  EKG:  EKG ordered today and personally reviewed.  The ekg ordered today demonstrates sinus rhythm normal    Physical Exam:    VS:  BP (!) 80/48 (BP Location: Right Arm, Patient Position: Sitting)   Pulse 93   Ht 5\' 6"  (1.676 m)   Wt 177 lb (80.3 kg)   SpO2 94%   BMI 28.57 kg/m     Wt Readings from Last 3 Encounters:  09/02/19 177 lb (80.3 kg)   05/18/18 184 lb (83.5 kg)  12/01/17 185 lb 6.4 oz (84.1 kg)     GEN:  Well nourished, well developed in no acute distress HEENT: Normal NECK: No JVD; No carotid bruits LYMPHATICS: No lymphadenopathy CARDIAC: RRR, no murmurs, rubs, gallops RESPIRATORY:  Clear to auscultation without rales, wheezing or rhonchi  ABDOMEN: Soft, non-tender, non-distended MUSCULOSKELETAL:  No edema; No deformity  SKIN: Warm and dry NEUROLOGIC:  Alert and oriented x 3 PSYCHIATRIC:  Normal affect    Signed, Shirlee More, MD  09/02/2019 3:36 PM    [facility]

## 2019-09-02 NOTE — Patient Instructions (Signed)
Medication Instructions:  Your physician has recommended you make the following change in your medication:  STOP: Aspirin 325 mg START: Aspirin 81 mg daily.  START: Repatha 140 mg per one injection. Inject one pen into the skin every 14 days. *If you need a refill on your cardiac medications before your next appointment, please call your pharmacy*   Lab Work: Your physician recommends that you return for lab work in: 6 weeks after starting [place] If you have labs (blood work) drawn today and your tests are completely normal, you will receive your results only by: Marland Kitchen MyChart Message (if you have MyChart) OR . A paper copy in the mail If you have any lab test that is abnormal or we need to change your treatment, we will call you to review the results.   Testing/Procedures: None   Follow-Up: At [facility], you and your health needs are our priority.  As part of our continuing mission to provide you with exceptional heart care, we have created designated Provider Care Teams.  These Care Teams include your primary Cardiologist (physician) and Advanced Practice Providers (APPs -  Physician Assistants and Nurse Practitioners) who all work together to provide you with the care you need, when you need it.  We recommend signing up for the patient portal called "MyChart".  Sign up information is provided on this After Visit Summary.  MyChart is used to connect with patients for Virtual Visits (Telemedicine).  Patients are able to view lab/test results, encounter notes, upcoming appointments, etc.  Non-urgent messages can be sent to your provider as well.   To learn more about what you can do with MyChart, go to [email or web address].    Your next appointment:   6 month(s)  The format for your next appointment:   In Person  Provider:   Shirlee More, MD   Other Instructions

## 2019-09-03 ENCOUNTER — Telehealth: Payer: Self-pay | Admitting: *Deleted

## 2019-09-03 NOTE — Telephone Encounter (Signed)
Received fax from [place] Review: Repatha 323 MG/ML [place] has been approved through 02/29/20.

## 2019-09-03 NOTE — Telephone Encounter (Signed)
Informed pharmacy of the approval of Repatha and she checked and said it went through and would fill for the pt.

## 2019-10-25 ENCOUNTER — Other Ambulatory Visit: Payer: Self-pay | Admitting: Nurse Practitioner

## 2019-10-25 DIAGNOSIS — R002 Palpitations: Secondary | ICD-10-CM

## 2019-10-25 DIAGNOSIS — I10 Essential (primary) hypertension: Secondary | ICD-10-CM

## 2019-10-25 DIAGNOSIS — Z952 Presence of prosthetic heart valve: Secondary | ICD-10-CM

## 2019-10-25 DIAGNOSIS — E782 Mixed hyperlipidemia: Secondary | ICD-10-CM

## 2019-10-25 DIAGNOSIS — I2510 Atherosclerotic heart disease of native coronary artery without angina pectoris: Secondary | ICD-10-CM

## 2019-11-05 ENCOUNTER — Other Ambulatory Visit: Payer: Self-pay | Admitting: Nurse Practitioner

## 2019-11-05 DIAGNOSIS — Z952 Presence of prosthetic heart valve: Secondary | ICD-10-CM

## 2019-11-05 DIAGNOSIS — R002 Palpitations: Secondary | ICD-10-CM

## 2019-11-05 DIAGNOSIS — I10 Essential (primary) hypertension: Secondary | ICD-10-CM

## 2019-11-05 DIAGNOSIS — I2510 Atherosclerotic heart disease of native coronary artery without angina pectoris: Secondary | ICD-10-CM

## 2019-11-05 DIAGNOSIS — E782 Mixed hyperlipidemia: Secondary | ICD-10-CM

## 2019-11-08 ENCOUNTER — Other Ambulatory Visit: Payer: Self-pay | Admitting: Nurse Practitioner

## 2019-11-08 DIAGNOSIS — R002 Palpitations: Secondary | ICD-10-CM

## 2019-11-08 DIAGNOSIS — I2510 Atherosclerotic heart disease of native coronary artery without angina pectoris: Secondary | ICD-10-CM

## 2019-11-08 DIAGNOSIS — Z952 Presence of prosthetic heart valve: Secondary | ICD-10-CM

## 2019-11-08 DIAGNOSIS — E782 Mixed hyperlipidemia: Secondary | ICD-10-CM

## 2019-11-08 DIAGNOSIS — I10 Essential (primary) hypertension: Secondary | ICD-10-CM

## 2019-11-13 DIAGNOSIS — D649 Anemia, unspecified: Secondary | ICD-10-CM | POA: Insufficient documentation

## 2019-11-13 HISTORY — DX: Anemia, unspecified: D64.9

## 2020-02-22 ENCOUNTER — Telehealth: Payer: Self-pay | Admitting: Oncology

## 2020-02-22 NOTE — Telephone Encounter (Signed)
Patient referred by Ihor Dow, PA-C for Iron Def/Anemia.  Appt made for 03/08/2020 Labs 2:30 pm - Consult 3:00 pm  Patient last seen 07/28/13 for same dx

## 2020-02-28 ENCOUNTER — Telehealth: Payer: Self-pay

## 2020-02-28 NOTE — Telephone Encounter (Signed)
PA started on CMM for Repatha. Key [id number]

## 2020-02-29 NOTE — Telephone Encounter (Signed)
[employer] has approved coverage for the drug requested under your Part D benefit for/through 02/10/2021. Your plan covers a recurring 30 or 90 day supply. Please note a recurring supply above 90 days is not covered.

## 2020-03-07 NOTE — Progress Notes (Signed)
[facility]  [address] [place],  [place]  [address] [phone]  Clinic Day:  03/10/2020  Referring physician: Lowella Dandy, NP   This document serves as a record of services personally performed by Hosie Poisson, MD. It was created on their behalf by Curry,Lauren E, a trained medical scribe. The creation of this record is based on the scribe's personal observations and the provider's statements to them.   CHIEF COMPLAINT:  CC: Iron deficiency anemia  Current Treatment:  B12 injections   HISTORY OF PRESENT ILLNESS:  Jaclyn [name] is a 75 y.o. female referred by Ihor Dow, PA-C for the evaluation and treatment of iron deficiency anemia.  In October she had pneumonia, but her hemoglobin was normal at 13.0 with an MCV of 92.  However, platelets at that time were mildly elevated at 495,000.  Iron level at that time was 55 with a TIBC of 241 for a percent saturation of 23%.  She presented to the [facility] Emergency Department at the end of November and was diagnosed with pneumonia again.  On arrival the patients hemoglobin was 13.3, however, this declined to 11.3 within 3 days at time of discharge.   She followed up with Ihor Dow in mid December for further lab evaluation.  Iron level was 27 with a TIBC of 205 for a percent saturation of 13%.  Chemistries were unremarkable except for a creatinine of 1.03, a calcium of 10.4 and an albumin of 3.5.  Last colonoscopy was in April 2006 with Dr. Lyndel Safe which was normal.  Patient refused further examination.    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  This had been diagnosed in 1998 and treated with chemotherapy and radiation with no evidence of recurrence.  She did quit smoking at that time.    INTERVAL HISTORY:  Jaclyn [name] has had the J&J COVID vaccine.  She has been placed on B12 injections weekly and has increased iron in her diet.  She denies  glossitis, pica, blood loss, or severe fatigue.  She is feeling somewhat better, but had been referred for possible IV iron.  Blood counts and chemistries are unremarkable including a hemoglobin of 13.7.  Iron level is 57.0 with a TIBC of 305 for a percent saturation of 18.6%.  Ferritin is 43.3.  Her  appetite is good, and she denies weight loss.  She denies fever, chills or other signs of infection.  She denies nausea, vomiting, bowel issues, or abdominal pain.  She denies sore throat, cough, dyspnea, or chest pain.  REVIEW OF SYSTEMS:  Review of Systems  Constitutional: Negative.   HENT:  Negative.   Eyes: Negative.   Respiratory: Negative.   Cardiovascular: Negative.   Gastrointestinal: Negative.   Endocrine: Negative.   Genitourinary: Negative.    Musculoskeletal:       Body aches  Skin: Negative.   Neurological: Negative.   Hematological: Negative.   Psychiatric/Behavioral: Negative.      VITALS:  Blood pressure (!) 171/72, pulse 90, temperature 97.9 F (36.6 C), temperature source Oral, resp. rate 18, height 5\' 6"  (1.676 m), weight 178 lb 1.6 oz (80.8 kg), SpO2 97 %.  Wt Readings from Last 3 Encounters:  03/08/20 178 lb 1.6 oz (80.8 kg)  09/02/19 177 lb (80.3 kg)  05/18/18 184 lb (83.5 kg)    Body mass index is 28.75 kg/m.  Performance status (ECOG): 1 -  Symptomatic but completely ambulatory  PHYSICAL EXAM:  Physical Exam Constitutional:      General: She is not in acute distress.    Appearance: Normal appearance. She is normal weight.  HENT:     Head: Normocephalic and atraumatic.  Eyes:     General: No scleral icterus.    Extraocular Movements: Extraocular movements intact.     Conjunctiva/sclera: Conjunctivae normal.     Pupils: Pupils are equal, round, and reactive to light.  Cardiovascular:     Rate and Rhythm: Normal rate and regular rhythm.     Pulses: Normal pulses.     Heart sounds: Normal heart sounds. No murmur heard. No friction rub. No gallop.    Pulmonary:     Effort: Pulmonary effort is normal. No respiratory distress.     Breath sounds: Normal breath sounds.  Abdominal:     General: Bowel sounds are normal. There is no distension.     Palpations: Abdomen is soft. There is no mass.     Tenderness: There is no abdominal tenderness.  Musculoskeletal:        General: Normal range of motion.     Cervical back: Normal range of motion and neck supple.     Right lower leg: No edema.     Left lower leg: No edema.  Lymphadenopathy:     Cervical: No cervical adenopathy.  Skin:    General: Skin is warm and dry.  Neurological:     General: No focal deficit present.     Mental Status: She is alert and oriented to person, place, and time. Mental status is at baseline.  Psychiatric:        Mood and Affect: Mood normal.        Behavior: Behavior normal.        Thought Content: Thought content normal.        Judgment: Judgment normal.     LABS:   CBC Latest Ref Rng & Units 03/08/2020 10/15/2016 09/24/2011  WBC - 8.1 7.8 15.4(H)  Hemoglobin 12.0 - 16.0 13.7 12.4 9.8(L)  Hematocrit 36 - 46 42 37.9 30.1(L)  Platelets 150 - 399 280 324 675(H)   CMP Latest Ref Rng & Units 12/01/2017 11/19/2017 09/24/2011  Glucose 65 - 99 mg/dL 62(L) 115(H) 152(H)  BUN 8 - 27 mg/dL 23 16 30(H)  Creatinine 0.57 - 1.00 mg/dL 1.20(H) 1.03(H) 1.88(H)  Sodium 134 - 144 mmol/L 141 137 132(L)  Potassium 3.5 - 5.2 mmol/L 4.7 4.2 4.4  Chloride 96 - 106 mmol/L 103 97 91(L)  CO2 20 - 29 mmol/L 24 24 30   Calcium 8.7 - 10.3 mg/dL 11.0(H) 10.6(H) 9.9  Total Protein 6.0 - 8.3 g/dL - - -  Total Bilirubin 0.3 - 1.2 mg/dL - - -  Alkaline Phos 39 - 117 U/L - - -  AST 0 - 37 U/L - - -  ALT 0 - 35 U/L - - -     Lab Results  Component Value Date   TIBC 305 03/08/2020   TIBC 230 (L) 09/27/2008   TIBC 247 (L) 09/21/2007   FERRITIN 106 09/27/2008   FERRITIN 186 09/21/2007   FERRITIN 169 03/19/2007   IRONPCTSAT 18.6 03/08/2020   IRONPCTSAT 42 09/27/2008    IRONPCTSAT 33 09/21/2007   Lab Results  Component Value Date   LDH 166 09/27/2008   LDH 178 09/21/2007   LDH 169 03/19/2007    STUDIES:  No results found.    HISTORY:   Past Medical History:  Diagnosis Date  . Abnormal mammogram of both breasts   . Acute rhinosinusitis   . Acute upper respiratory infection, unspecified   . Allergic rhinitis   . Allergic rhinitis   . Anemia    blood transfusion- 6 yrs. ago by Dr. Ralene Ok   . Anxiety   . Aortic stenosis    a. 09/11/2011 s/p AVR - 46mm Edwards Magna Ease Pericardial Tissue Valve (with CABG x 3 as above).  . Aortic stenosis, severe 08/22/2011  . AS (aortic stenosis) 08/28/2011  . Benign essential HTN 08/07/2015  . Body mass index 30.0-30.9, adult   . CAD in native artery   . Cancer ([place])   . Carotid bruit 06/13/2011  . Carotid stenosis, right   . Chest pain   . Chronic constipation   . Coronary artery disease    a. 09/11/2011 CABGx 3: LIMA->LAD, VG->D1, VG->PLV (with AVR - 13mm Edwards Magna Ease Pericardial Tissue Valve).  . Coronary atherosclerosis of native coronary artery 08/22/2011  . Depression   . Dermatitis   . DM (diabetes mellitus) ([place])    type I - treated with insulin pump /w x19yrs  . Essential hypertension, benign   . Fatigue   . Fibrocystic disease of breast   . Foot pain   . Gastroparesis   . Hair loss   . Hematuria   . History of lung cancer   . HTN (hypertension)    a. 09/2011 BP's running low (in setting of weight loss and elimination of salt from diet).  . Hypercalcemia 08/07/2015  . Hyperlipidemia   . Hyperlipidemia LDL goal <70   . Hypotension 09/24/2011  . Hypothyroidism   . Hypothyroidism in adult   . Joint pain   . Keratosis   . Lung cancer ([place])    Small Cell carcinoma of the lung treated with chemo + radiation therapy to mediastinum and chest   . Mass of both breasts on mammogram   . Menopausal disorder   . Menopause   . Microalbuminuria   . Murmur   . Myalgia   . Orthostatic  hypotension   . Osteoporosis   . Palpitations 06/13/2011  . Peripheral neuropathy   . Personal history of fall   . Plantar fasciitis   . Positive depression screening   . 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 S/P aortic valve replacement with bioprosthetic valve 09/11/2011   21 mm [facility] Ease pericardial tissue valve   . S/P AVR (aortic valve replacement) 10/07/2011  . S/P CABG x 3 09/11/2011   Free LIMA to LAD, SVG to D1, SVG to RPL, EVH via right thigh and leg   . S/P radiation therapy    Radiation therapy to chest and mediastinum for small cell carcinoma of the lung  . Screening for alcoholism   . Seasonal allergies   . Seizures ([place])    33 yrs. ago, was on phenobarbital for sometime, off it for 20 yrs.   . SOB (shortness of breath)   . Special screening for malignant neoplasms, colon   . Statin intolerance 01/12/2019  . Statin myopathy 01/21/2019  . Type 1 diabetes mellitus with diabetic polyneuropathy ([place]) 08/07/2015  . Varicose veins of left lower extremity with pain   . Vertigo   . Visit for screening mammogram   . Vitamin D deficiency     Past Surgical History:  Procedure Laterality Date  . AORTIC VALVE REPLACEMENT  09/11/2011   21 mm [facility] Ease pericardial tissue valve  .  CARPAL TUNNEL RELEASE     bilateral  . CATARACT EXTRACTION, BILATERAL Bilateral   . CORONARY ARTERY BYPASS GRAFT  09/11/2011   LIMA to LAD, SVG to D1, SVG to RPL  . ELBOW SURGERY     bilateral  . MULTIPLE EXTRACTIONS WITH ALVEOLOPLASTY  08/29/2011   Procedure: MULTIPLE EXTRACION WITH ALVEOLOPLASTY;  Surgeon: Lenn Cal, DDS;  Location: [facility];  Service: Oral Surgery;  Laterality: N/A;  extraction of teeth # 22, 27 with alveoloplasty  . SHOULDER SURGERY     bilateral- RCR-   . THYROIDECTOMY     partial- R   . TOTAL ABDOMINAL HYSTERECTOMY      Family History  Problem Relation Age of Onset  . Heart failure Mother   . Hypertension Mother   . Thyroid disease Mother   . Heart  disease Mother   . Cancer Father   . Cancer Brother        Throat  . Throat cancer Brother   . Lymphoma Brother   . Alcohol abuse Brother   . Diabetes Mellitus I Brother   . Coronary artery disease Brother 47  . Heart disease Brother   . Heart attack Brother   . Heart disease Sister   . Throat cancer Sister   . Diabetes Mellitus I Sister   . Stroke Sister     Social History:  reports that she quit smoking about 22 years ago. Her smoking use included cigarettes. She has a 86.00 pack-year smoking history. She has never used smokeless tobacco. She reports that she does not drink alcohol and does not use drugs.The patient is accompanied by her daughter today.  She is divorced and lives at home alone.  She has 2 children.  She is retired, and has never been exposed to chemicals.   Allergies:  Allergies  Allergen Reactions  . Statins     Joints ache...crestor...lipitor...crestor  . Codeine Other (See Comments)    Can't wake up    Current Medications: Current Outpatient Medications  Medication Sig Dispense Refill  . aspirin 81 MG chewable tablet Chew 81 mg by mouth daily.    . ergocalciferol (VITAMIN D2) 1.25 MG (50000 UT) capsule Take 1 capsule by mouth once a week.    . Evolocumab (REPATHA SURECLICK) 409 MG/ML SOAJ Inject 140 mg into the skin every 14 (fourteen) days. 2 pen 6  . insulin regular (NOVOLIN R,HUMULIN R) 100 units/mL injection Use as directed in insulin pump.  MDD: 65 units/day    . losartan (COZAAR) 25 MG tablet Take 1 tablet by mouth once daily 90 tablet 1   No current facility-administered medications for this visit.   Facility-Administered Medications Ordered in Other Visits  Medication Dose Route Frequency Provider Last Rate Last Admin  . albuterol (PROVENTIL) (5 MG/ML) 0.5% nebulizer solution 2.5 mg  2.5 mg Nebulization Once Rexene Alberts, MD         ASSESSMENT & PLAN:   Assessment:   1.  Iron deficiency anemia, resolved.  I think her anemia was also  related to the two recent episodes of pneumonia and she Is recovering now.  She is also on B12 injections and these are probably helping as well.   2.  History of small cell lung cancer, diagnosed in 1998, treated with chemoradiation.  She remains in remission.  Plan: I recommended that she consider yearly follow up and CT scans, but she does not need any further intervention from me at this time.  I did  recommend that she have a COVID vaccine booster.  She and her daughter understand and agree with this plan of care.  I have answered their questions, and they know to call with any concerns.  Thank you for the opportunity to participate in the care of your patients   I provided 60 minutes of face-to-face time during this this encounter and > 50% was spent counseling as documented under my assessment and plan.    Derwood Kaplan, MD [facility] AT [facility] [address] [place] [address] Dept: [phone] Dept Fax: [phone]   I, Rita Ohara, am acting as scribe for Derwood Kaplan, MD  I have reviewed this report as typed by the medical scribe, and it is complete and accurate.

## 2020-03-08 ENCOUNTER — Inpatient Hospital Stay: Payer: Medicare HMO | Admitting: Oncology

## 2020-03-08 ENCOUNTER — Other Ambulatory Visit: Payer: Self-pay

## 2020-03-08 ENCOUNTER — Encounter: Payer: Self-pay | Admitting: Oncology

## 2020-03-08 ENCOUNTER — Other Ambulatory Visit: Payer: Self-pay | Admitting: Hematology and Oncology

## 2020-03-08 ENCOUNTER — Other Ambulatory Visit: Payer: Self-pay | Admitting: Oncology

## 2020-03-08 ENCOUNTER — Inpatient Hospital Stay: Payer: Medicare HMO | Attending: Oncology

## 2020-03-08 DIAGNOSIS — D509 Iron deficiency anemia, unspecified: Secondary | ICD-10-CM | POA: Diagnosis not present

## 2020-03-08 DIAGNOSIS — Z87891 Personal history of nicotine dependence: Secondary | ICD-10-CM

## 2020-03-08 DIAGNOSIS — Z79899 Other long term (current) drug therapy: Secondary | ICD-10-CM | POA: Diagnosis not present

## 2020-03-08 DIAGNOSIS — D649 Anemia, unspecified: Secondary | ICD-10-CM

## 2020-03-08 DIAGNOSIS — Z85118 Personal history of other malignant neoplasm of bronchus and lung: Secondary | ICD-10-CM

## 2020-03-08 LAB — CBC AND DIFFERENTIAL
HCT: 42 (ref 36–46)
Hemoglobin: 13.7 (ref 12.0–16.0)
Neutrophils Absolute: 6.16
Platelets: 280 (ref 150–399)
WBC: 8.1

## 2020-03-08 LAB — IRON,TIBC AND FERRITIN PANEL
%SAT: 18.6
Iron: 57
TIBC: 305

## 2020-03-08 LAB — CBC: RBC: 4.69 (ref 3.87–5.11)

## 2020-03-09 LAB — SOLUBLE TRANSFERRIN RECEPTOR: Transferrin Receptor: 30.2 nmol/L — ABNORMAL HIGH (ref 12.2–27.3)

## 2020-03-10 ENCOUNTER — Encounter: Payer: Self-pay | Admitting: Oncology

## 2020-05-15 ENCOUNTER — Other Ambulatory Visit: Payer: Self-pay | Admitting: Cardiology

## 2020-05-15 DIAGNOSIS — E782 Mixed hyperlipidemia: Secondary | ICD-10-CM

## 2020-05-15 DIAGNOSIS — I2510 Atherosclerotic heart disease of native coronary artery without angina pectoris: Secondary | ICD-10-CM

## 2020-05-15 DIAGNOSIS — I10 Essential (primary) hypertension: Secondary | ICD-10-CM

## 2020-05-15 DIAGNOSIS — Z952 Presence of prosthetic heart valve: Secondary | ICD-10-CM

## 2020-05-15 DIAGNOSIS — R002 Palpitations: Secondary | ICD-10-CM

## 2020-05-15 NOTE — Telephone Encounter (Signed)
Losartan approved and sent 

## 2020-06-15 DIAGNOSIS — L659 Nonscarring hair loss, unspecified: Secondary | ICD-10-CM | POA: Insufficient documentation

## 2020-06-15 DIAGNOSIS — Z1339 Encounter for screening examination for other mental health and behavioral disorders: Secondary | ICD-10-CM | POA: Insufficient documentation

## 2020-06-15 DIAGNOSIS — Z9181 History of falling: Secondary | ICD-10-CM | POA: Insufficient documentation

## 2020-06-15 DIAGNOSIS — I83812 Varicose veins of left lower extremities with pain: Secondary | ICD-10-CM | POA: Insufficient documentation

## 2020-06-15 DIAGNOSIS — L309 Dermatitis, unspecified: Secondary | ICD-10-CM | POA: Insufficient documentation

## 2020-06-15 DIAGNOSIS — J302 Other seasonal allergic rhinitis: Secondary | ICD-10-CM | POA: Insufficient documentation

## 2020-06-15 DIAGNOSIS — I2510 Atherosclerotic heart disease of native coronary artery without angina pectoris: Secondary | ICD-10-CM | POA: Insufficient documentation

## 2020-06-15 DIAGNOSIS — C801 Malignant (primary) neoplasm, unspecified: Secondary | ICD-10-CM | POA: Insufficient documentation

## 2020-06-15 DIAGNOSIS — I10 Essential (primary) hypertension: Secondary | ICD-10-CM | POA: Insufficient documentation

## 2020-06-15 DIAGNOSIS — N959 Unspecified menopausal and perimenopausal disorder: Secondary | ICD-10-CM | POA: Insufficient documentation

## 2020-06-15 DIAGNOSIS — J019 Acute sinusitis, unspecified: Secondary | ICD-10-CM | POA: Insufficient documentation

## 2020-06-15 DIAGNOSIS — Z1331 Encounter for screening for depression: Secondary | ICD-10-CM | POA: Insufficient documentation

## 2020-06-15 DIAGNOSIS — Z1231 Encounter for screening mammogram for malignant neoplasm of breast: Secondary | ICD-10-CM | POA: Insufficient documentation

## 2020-06-15 DIAGNOSIS — Z1211 Encounter for screening for malignant neoplasm of colon: Secondary | ICD-10-CM | POA: Insufficient documentation

## 2020-06-15 DIAGNOSIS — F419 Anxiety disorder, unspecified: Secondary | ICD-10-CM | POA: Insufficient documentation

## 2020-06-15 DIAGNOSIS — I6521 Occlusion and stenosis of right carotid artery: Secondary | ICD-10-CM | POA: Insufficient documentation

## 2020-06-15 DIAGNOSIS — E039 Hypothyroidism, unspecified: Secondary | ICD-10-CM | POA: Insufficient documentation

## 2020-06-15 DIAGNOSIS — N6310 Unspecified lump in the right breast, unspecified quadrant: Secondary | ICD-10-CM | POA: Insufficient documentation

## 2020-06-15 DIAGNOSIS — E785 Hyperlipidemia, unspecified: Secondary | ICD-10-CM | POA: Insufficient documentation

## 2020-06-15 DIAGNOSIS — F32A Depression, unspecified: Secondary | ICD-10-CM | POA: Insufficient documentation

## 2020-06-15 DIAGNOSIS — R569 Unspecified convulsions: Secondary | ICD-10-CM | POA: Insufficient documentation

## 2020-06-15 DIAGNOSIS — Z85118 Personal history of other malignant neoplasm of bronchus and lung: Secondary | ICD-10-CM | POA: Insufficient documentation

## 2020-06-15 DIAGNOSIS — J069 Acute upper respiratory infection, unspecified: Secondary | ICD-10-CM | POA: Insufficient documentation

## 2020-06-15 DIAGNOSIS — J449 Chronic obstructive pulmonary disease, unspecified: Secondary | ICD-10-CM | POA: Insufficient documentation

## 2020-06-15 DIAGNOSIS — K5909 Other constipation: Secondary | ICD-10-CM | POA: Insufficient documentation

## 2020-06-15 DIAGNOSIS — J701 Chronic and other pulmonary manifestations due to radiation: Secondary | ICD-10-CM | POA: Insufficient documentation

## 2020-06-15 DIAGNOSIS — N6019 Diffuse cystic mastopathy of unspecified breast: Secondary | ICD-10-CM | POA: Insufficient documentation

## 2020-06-15 DIAGNOSIS — J309 Allergic rhinitis, unspecified: Secondary | ICD-10-CM | POA: Insufficient documentation

## 2020-06-15 DIAGNOSIS — R928 Other abnormal and inconclusive findings on diagnostic imaging of breast: Secondary | ICD-10-CM | POA: Insufficient documentation

## 2020-06-15 DIAGNOSIS — Z6830 Body mass index (BMI) 30.0-30.9, adult: Secondary | ICD-10-CM | POA: Insufficient documentation

## 2020-06-15 DIAGNOSIS — R42 Dizziness and giddiness: Secondary | ICD-10-CM | POA: Insufficient documentation

## 2020-07-03 ENCOUNTER — Other Ambulatory Visit: Payer: Self-pay | Admitting: Cardiology

## 2020-07-03 DIAGNOSIS — I2510 Atherosclerotic heart disease of native coronary artery without angina pectoris: Secondary | ICD-10-CM

## 2020-07-03 DIAGNOSIS — E782 Mixed hyperlipidemia: Secondary | ICD-10-CM

## 2020-07-03 DIAGNOSIS — Z952 Presence of prosthetic heart valve: Secondary | ICD-10-CM

## 2020-07-03 DIAGNOSIS — I10 Essential (primary) hypertension: Secondary | ICD-10-CM

## 2020-07-03 DIAGNOSIS — R002 Palpitations: Secondary | ICD-10-CM

## 2020-07-03 NOTE — Telephone Encounter (Signed)
Pharmacy notified refill to soon.

## 2020-07-05 ENCOUNTER — Other Ambulatory Visit: Payer: Self-pay

## 2020-07-05 ENCOUNTER — Ambulatory Visit: Payer: Medicare HMO | Admitting: Cardiology

## 2020-07-05 ENCOUNTER — Encounter: Payer: Self-pay | Admitting: Cardiology

## 2020-07-05 VITALS — BP 148/62 | HR 86 | Ht 66.5 in | Wt 181.0 lb

## 2020-07-05 DIAGNOSIS — I2510 Atherosclerotic heart disease of native coronary artery without angina pectoris: Secondary | ICD-10-CM

## 2020-07-05 DIAGNOSIS — E782 Mixed hyperlipidemia: Secondary | ICD-10-CM

## 2020-07-05 DIAGNOSIS — R0989 Other specified symptoms and signs involving the circulatory and respiratory systems: Secondary | ICD-10-CM | POA: Diagnosis not present

## 2020-07-05 DIAGNOSIS — E785 Hyperlipidemia, unspecified: Secondary | ICD-10-CM

## 2020-07-05 DIAGNOSIS — Z952 Presence of prosthetic heart valve: Secondary | ICD-10-CM | POA: Diagnosis not present

## 2020-07-05 DIAGNOSIS — Z789 Other specified health status: Secondary | ICD-10-CM

## 2020-07-05 DIAGNOSIS — I10 Essential (primary) hypertension: Secondary | ICD-10-CM

## 2020-07-05 MED ORDER — EZETIMIBE 10 MG PO TABS: 10.0000 mg | ORAL_TABLET | Freq: Every day | ORAL | 3 refills | Status: DC

## 2020-07-05 NOTE — Progress Notes (Signed)
Cardiology Office Note:    Date:  07/05/2020   ID:  Jaclyn [name], DOB Feb 29, 1945, MRN [id number]  PCP:  Jaclyn Dandy, NP  Cardiologist:  Jaclyn More, MD    Referring MD: Jaclyn Dandy, NP    ASSESSMENT:    1. Coronary artery disease involving native heart without angina pectoris, unspecified vessel or lesion type   2. Hyperlipidemia, unspecified hyperlipidemia type   3. S/P AVR (aortic valve replacement)   4. Jaclyn [name] carotid bruits   5. Mixed hyperlipidemia   6. Statin intolerance   7. Benign essential HTN    PLAN:    In order of problems listed above:  1. Patient remains asymptomatic after remote CABG and aortic valve replacement.  Continue aspirin antihypertensive but very concerned about the absence of lipid-lowering therapy in her case statins are normal option she has declined PCSK9 inhibitors worried about side effects and already utilizes Zetia recheck a lipid profile in 6 weeks and if LDL is above 70 consider low-dose bempedoic acid 2. Stable valve function will need a follow-up duplex in 1 year 3. She has loud disproportionate Jaclyn [name] carotid bruits at risk for compressive stenosis check duplex 4. Initiate lipid-lowering therapy 70 5. Continue ARB   Next appointment: 1 year   Medication Adjustments/Labs and Tests Ordered: Current medicines are reviewed at length with the patient today.  Concerns regarding medicines are outlined above.  Orders Placed This Encounter  Procedures  . Lipid panel  . VAS US CAROTID   Meds ordered this encounter  Medications  . ezetimibe (ZETIA) 10 MG tablet    Sig: Take 1 tablet (10 mg total) by mouth daily.    Dispense:  90 tablet    Refill:  3    Chief Complaint  Patient presents with  . Follow-up    CAD CABG AVR Jaclyn [name] carotid bruits    History of Present Illness:    Jaclyn [name] is a 75 y.o. female with a hx of CAD CABG and bioprosthetic AVR in 2013 hypertension dyslipidemia with statin intolerance  and type 1 diabetes last seen 09/02/2019.  Compliance with diet, lifestyle and medications: Yes  She decided never to take Repatha warned about side effects She has been intolerant of statins with muscle weakness and pain We discussed options and she will try Zetia 10 mg daily lipid profile in 6 weeks She has Jaclyn [name] carotid bruits duplex ordered has had no TIA.  Previous duplex 2018 showed mild 1 to 39% Jaclyn [name] ICA stenosis No chest pain edema shortness of breath palpitation or syncope.  Recent labs 04/25/2018 to Jaclyn [name]: Hemoglobin A1c 6.2% From Jaclyn [name] 03/08/2020 hemoglobin 13.7 05/25/2019 cholesterol 267 LDL 163 triglycerides 102 HDL 66 and non-HDL cholesterol 201  Echocardiogram October 2019 showed normal bioprosthetic AVR function ejection fraction 55 to 60%. Past Medical History:  Diagnosis Date  . Abnormal mammogram of both breasts   . Acute rhinosinusitis   . Acute upper respiratory infection, unspecified   . Allergic rhinitis   . Allergic rhinitis   . Anemia    blood transfusion- 6 yrs. ago by Dr. Ralene [name]   . Anxiety   . Aortic stenosis    a. 09/11/2011 s/p AVR - 42mm Edwards Magna Ease Pericardial Tissue Valve (with CABG x 3 as above).  . Aortic stenosis, severe 08/22/2011  . AS (aortic stenosis) 08/28/2011  . Benign essential HTN 08/07/2015  . Body mass index 30.0-30.9, adult   . CAD in native artery   .  Cancer (Jaclyn [name])   . Carotid bruit 06/13/2011  . Carotid stenosis, right   . Chest pain   . Chronic constipation   . COPD (chronic obstructive pulmonary disease) (Jaclyn [name])   . Coronary artery disease    a. 09/11/2011 CABGx 3: LIMA->LAD, VG->D1, VG->PLV (with AVR - 32mm Edwards Magna Ease Pericardial Tissue Valve).  . Coronary atherosclerosis of native coronary artery 08/22/2011  . Depression   . Dermatitis   . DM (diabetes mellitus) (Jaclyn [name])    type I - treated with insulin pump /w x38yrs  . Essential hypertension, benign   . Fatigue   . Fibrocystic  disease of breast   . Foot pain   . Gastroparesis   . Hair loss   . Hematuria   . History of lung cancer   . HTN (hypertension)    a. 09/2011 BP's running low (in setting of weight loss and elimination of salt from diet).  . Hypercalcemia 08/07/2015  . Hyperlipidemia   . Hyperlipidemia LDL goal <70   . Hypotension 09/24/2011  . Hypothyroidism   . Hypothyroidism in adult   . Joint pain   . Keratosis   . Lung cancer (Jaclyn [name])    Small Cell carcinoma of the lung treated with chemo + radiation therapy to mediastinum and chest   . Mass of both breasts on mammogram   . Menopausal disorder   . Menopause   . Microalbuminuria   . Murmur   . Myalgia   . Orthostatic hypotension   . Osteoporosis   . Palpitations 06/13/2011  . Peripheral neuropathy   . Personal history of fall   . Plantar fasciitis   . Positive depression screening   . Radiation fibrosis of lung (Jaclyn [name])   . S/P aortic valve replacement with bioprosthetic valve 09/11/2011   21 mm [name] Ease pericardial tissue valve   . S/P AVR (aortic valve replacement) 10/07/2011  . S/P CABG x 3 09/11/2011   Free LIMA to LAD, SVG to D1, SVG to RPL, EVH via right thigh and leg   . S/P radiation therapy    Radiation therapy to chest and mediastinum for small cell carcinoma of the lung  . Screening for alcoholism   . Seasonal allergies   . Seizures (Jaclyn [name])    33 yrs. ago, was on phenobarbital for sometime, off it for 20 yrs.   . SOB (shortness of breath)   . Special screening for malignant neoplasms, colon   . Statin intolerance 01/12/2019  . Statin myopathy 01/21/2019  . Type 1 diabetes mellitus with diabetic polyneuropathy (Jaclyn [name]) 08/07/2015  . Varicose veins of left lower extremity with pain   . Vertigo   . Visit for screening mammogram   . Vitamin D deficiency     Past Surgical History:  Procedure Laterality Date  . AORTIC VALVE REPLACEMENT  09/11/2011   21 mm [facility] Ease pericardial tissue valve  . BREAST BIOPSY     x2,  benign  . CARPAL TUNNEL RELEASE     Jaclyn [name]  . CATARACT EXTRACTION, Jaclyn [name] Jaclyn [name]   . CORONARY ARTERY BYPASS GRAFT  09/11/2011   LIMA to LAD, SVG to D1, SVG to RPL  . ELBOW SURGERY     Jaclyn [name]  . MULTIPLE EXTRACTIONS WITH ALVEOLOPLASTY  08/29/2011   Procedure: MULTIPLE EXTRACION WITH ALVEOLOPLASTY;  Surgeon: Lenn Cal, DDS;  Location: [facility];  Service: Oral Surgery;  Laterality: N/A;  extraction of teeth # 22, 27 with alveoloplasty  . SHOULDER SURGERY  Jaclyn [name]- RCR-   . THYROIDECTOMY     partial- R   . TOTAL ABDOMINAL HYSTERECTOMY     age 23    Current Medications: Current Meds  Medication Sig  . aspirin 81 MG chewable tablet Chew 81 mg by mouth daily.  Marland Kitchen ezetimibe (ZETIA) 10 MG tablet Take 1 tablet (10 mg total) by mouth daily.  . insulin regular (NOVOLIN R,HUMULIN R) 100 units/mL injection Sliding Scale  . levothyroxine (SYNTHROID) 112 MCG tablet Take 112 mcg by mouth daily before breakfast.  . losartan (COZAAR) 25 MG tablet Take 1 tablet by mouth once daily     Allergies:   Statins and Codeine   Social History   Socioeconomic History  . Marital status: Divorced    Spouse name: Not on file  . Number of children: 2  . Years of education: Not on file  . Highest education level: GED or equivalent  Occupational History  . Not on file  Tobacco Use  . Smoking status: Former Smoker    Packs/day: 2.00    Years: 43.00    Pack years: 86.00    Types: Cigarettes    Quit date: 08/27/1997    Years since quitting: 22.8  . Smokeless tobacco: Never Used  Vaping Use  . Vaping Use: Never used  Substance and Sexual Activity  . Alcohol use: No  . Drug use: No  . Sexual activity: Not on file  Other Topics Concern  . Not on file  Social History Narrative   The patient is divorced.   The patient has 2 daughters. Patient used to smoke between 1-2.5 packs per day for 43 years.    Patient quit smoking 12 years ago approximately 2001 after her lung cancer  diagnosis.   Social Determinants of [name]   Financial Resource Strain: Not on file  Food Insecurity: Not on file  Transportation Needs: Not on file  Physical Activity: Not on file  Stress: Not on file  Social Connections: Not on file     Family History: The patient's family history includes Alcohol abuse in her brother; Cancer in her brother and father; Coronary artery disease (age of onset: 31) in her brother; Diabetes Mellitus I in her brother and sister; Heart attack in her brother; Heart disease in her brother, mother, and sister; Heart failure in her mother; Hypertension in her mother; Lymphoma in her brother; Stroke in her sister; Throat cancer in her brother and sister; Thyroid disease in her mother. ROS:   Please see the history of present illness.    All other systems reviewed and are negative.  EKGs/Labs/Other Studies Reviewed:    The following studies were reviewed today:  EKG:  EKG ordered today and personally reviewed.  The ekg ordered today demonstrates sinus rhythm normal  Recent Labs: 03/08/2020: Hemoglobin 13.7; Platelets 280    Physical Exam:    VS:  BP (!) 148/62 (BP Location: Left Arm, Patient Position: Sitting, Cuff Size: Large)   Pulse 86   Ht 5' 6.5" (1.689 m)   Wt 181 lb (82.1 kg)   SpO2 98%   BMI 28.78 kg/m     Wt Readings from Last 3 Encounters:  07/05/20 181 lb (82.1 kg)  03/08/20 178 lb 1.6 oz (80.8 kg)  09/02/19 177 lb (80.3 kg)     GEN:  Well nourished, well developed in no acute distress HEENT: Normal NECK: No JVD; she has Jaclyn [name] holosystolic carotid bruits LYMPHATICS: No lymphadenopathy CARDIAC: RRR, no murmurs, rubs, gallops RESPIRATORY:  Clear to auscultation without rales, wheezing or rhonchi  ABDOMEN: Soft, non-tender, non-distended MUSCULOSKELETAL:  No edema; No deformity  SKIN: Warm and dry NEUROLOGIC:  Alert and oriented x 3 PSYCHIATRIC:  Normal affect    Signed, Jaclyn More, MD  07/05/2020 4:21 PM    [facility]

## 2020-07-05 NOTE — Patient Instructions (Signed)
Medication Instructions:  Your physician has recommended you make the following change in your medication:  START: Zetia 10 mg take one tablet by mouth every evening with a meal. *If you need a refill on your cardiac medications before your next appointment, please call your pharmacy*   Lab Work: Your physician recommends that you return for lab work in: 6 weeks Lipids If you have labs (blood work) drawn today and your tests are completely normal, you will receive your results only by: Marland Kitchen MyChart Message (if you have MyChart) OR . A paper copy in the mail If you have any lab test that is abnormal or we need to change your treatment, we will call you to review the results.   Testing/Procedures: Your physician has requested that you have a carotid duplex. This test is an ultrasound of the carotid arteries in your neck. It looks at blood flow through these arteries that supply the brain with blood. Allow one hour for this exam. There are no restrictions or special instructions.     Follow-Up: At [facility], you and your health needs are our priority.  As part of our continuing mission to provide you with exceptional heart care, we have created designated Provider Care Teams.  These Care Teams include your primary Cardiologist (physician) and Advanced Practice Providers (APPs -  Physician Assistants and Nurse Practitioners) who all work together to provide you with the care you need, when you need it.  We recommend signing up for the patient portal called "MyChart".  Sign up information is provided on this After Visit Summary.  MyChart is used to connect with patients for Virtual Visits (Telemedicine).  Patients are able to view lab/test results, encounter notes, upcoming appointments, etc.  Non-urgent messages can be sent to your provider as well.   To learn more about what you can do with MyChart, go to [email or web address].    Your next appointment:   1 year(s)  The format for your  next appointment:   In Person  Provider:   Shirlee More, MD   Other Instructions

## 2020-08-11 ENCOUNTER — Other Ambulatory Visit: Payer: Self-pay | Admitting: Cardiology

## 2020-08-11 DIAGNOSIS — R002 Palpitations: Secondary | ICD-10-CM

## 2020-08-11 DIAGNOSIS — I10 Essential (primary) hypertension: Secondary | ICD-10-CM

## 2020-08-11 DIAGNOSIS — Z952 Presence of prosthetic heart valve: Secondary | ICD-10-CM

## 2020-08-11 DIAGNOSIS — I2510 Atherosclerotic heart disease of native coronary artery without angina pectoris: Secondary | ICD-10-CM

## 2020-08-11 DIAGNOSIS — E782 Mixed hyperlipidemia: Secondary | ICD-10-CM

## 2020-08-11 NOTE — Telephone Encounter (Signed)
Rx approved and sent 

## 2020-08-21 ENCOUNTER — Other Ambulatory Visit: Payer: Self-pay

## 2020-08-21 ENCOUNTER — Ambulatory Visit (INDEPENDENT_AMBULATORY_CARE_PROVIDER_SITE_OTHER): Payer: Medicare HMO

## 2020-08-21 DIAGNOSIS — R0989 Other specified symptoms and signs involving the circulatory and respiratory systems: Secondary | ICD-10-CM | POA: Diagnosis not present

## 2020-08-21 NOTE — Progress Notes (Signed)
Complete echocardiogram performed.  Jimmy Odin Mariani RDCS, RVT  

## 2020-08-22 ENCOUNTER — Telehealth: Payer: Self-pay

## 2020-08-22 NOTE — Telephone Encounter (Signed)
Able to reach pt regarding her recent  carotid u/s, Dr. Bettina Gavia had a chance to review her results and advised   "Mild to moderate stenosis in the right mild on the left we will plan to rechecka duplex in 1 year"  Jaclyn [name] stated "this how it was last time", advised goof to know no worsening of progression and appears Dr. Bettina Gavia wants to reevaluate in one year to see of any changes, Jaclyn [name] verbalized understanding.  Otherwise no questions or concerns at this time, will call back for anything further.

## 2020-11-23 DIAGNOSIS — D519 Vitamin B12 deficiency anemia, unspecified: Secondary | ICD-10-CM | POA: Diagnosis not present

## 2020-11-27 DIAGNOSIS — E1042 Type 1 diabetes mellitus with diabetic polyneuropathy: Secondary | ICD-10-CM | POA: Diagnosis not present

## 2020-12-25 DIAGNOSIS — D519 Vitamin B12 deficiency anemia, unspecified: Secondary | ICD-10-CM | POA: Diagnosis not present

## 2020-12-25 DIAGNOSIS — Z23 Encounter for immunization: Secondary | ICD-10-CM | POA: Diagnosis not present

## 2021-01-05 DIAGNOSIS — E10649 Type 1 diabetes mellitus with hypoglycemia without coma: Secondary | ICD-10-CM | POA: Diagnosis not present

## 2021-01-05 DIAGNOSIS — Z9641 Presence of insulin pump (external) (internal): Secondary | ICD-10-CM | POA: Diagnosis not present

## 2021-01-12 DIAGNOSIS — E1065 Type 1 diabetes mellitus with hyperglycemia: Secondary | ICD-10-CM | POA: Diagnosis not present

## 2021-01-12 DIAGNOSIS — Z9641 Presence of insulin pump (external) (internal): Secondary | ICD-10-CM | POA: Diagnosis not present

## 2021-01-23 DIAGNOSIS — Z4681 Encounter for fitting and adjustment of insulin pump: Secondary | ICD-10-CM | POA: Diagnosis not present

## 2021-01-23 DIAGNOSIS — E559 Vitamin D deficiency, unspecified: Secondary | ICD-10-CM | POA: Diagnosis not present

## 2021-01-23 DIAGNOSIS — E1042 Type 1 diabetes mellitus with diabetic polyneuropathy: Secondary | ICD-10-CM | POA: Diagnosis not present

## 2021-01-23 DIAGNOSIS — Z9641 Presence of insulin pump (external) (internal): Secondary | ICD-10-CM | POA: Diagnosis not present

## 2021-01-23 DIAGNOSIS — E039 Hypothyroidism, unspecified: Secondary | ICD-10-CM | POA: Diagnosis not present

## 2021-01-25 DIAGNOSIS — D519 Vitamin B12 deficiency anemia, unspecified: Secondary | ICD-10-CM | POA: Diagnosis not present

## 2021-02-01 DIAGNOSIS — E1042 Type 1 diabetes mellitus with diabetic polyneuropathy: Secondary | ICD-10-CM | POA: Diagnosis not present

## 2021-02-15 DIAGNOSIS — Z9641 Presence of insulin pump (external) (internal): Secondary | ICD-10-CM | POA: Diagnosis not present

## 2021-02-15 DIAGNOSIS — E109 Type 1 diabetes mellitus without complications: Secondary | ICD-10-CM | POA: Diagnosis not present

## 2021-02-26 DIAGNOSIS — D519 Vitamin B12 deficiency anemia, unspecified: Secondary | ICD-10-CM | POA: Diagnosis not present

## 2021-03-29 DIAGNOSIS — Z6830 Body mass index (BMI) 30.0-30.9, adult: Secondary | ICD-10-CM | POA: Diagnosis not present

## 2021-03-29 DIAGNOSIS — L0231 Cutaneous abscess of buttock: Secondary | ICD-10-CM | POA: Diagnosis not present

## 2021-03-29 DIAGNOSIS — E1022 Type 1 diabetes mellitus with diabetic chronic kidney disease: Secondary | ICD-10-CM | POA: Diagnosis not present

## 2021-03-29 DIAGNOSIS — I10 Essential (primary) hypertension: Secondary | ICD-10-CM | POA: Diagnosis not present

## 2021-03-29 DIAGNOSIS — D519 Vitamin B12 deficiency anemia, unspecified: Secondary | ICD-10-CM | POA: Diagnosis not present

## 2021-03-29 DIAGNOSIS — E039 Hypothyroidism, unspecified: Secondary | ICD-10-CM | POA: Diagnosis not present

## 2021-03-29 DIAGNOSIS — Z139 Encounter for screening, unspecified: Secondary | ICD-10-CM | POA: Diagnosis not present

## 2021-04-04 DIAGNOSIS — Z9641 Presence of insulin pump (external) (internal): Secondary | ICD-10-CM | POA: Diagnosis not present

## 2021-04-04 DIAGNOSIS — E1065 Type 1 diabetes mellitus with hyperglycemia: Secondary | ICD-10-CM | POA: Diagnosis not present

## 2021-04-13 DIAGNOSIS — I10 Essential (primary) hypertension: Secondary | ICD-10-CM | POA: Diagnosis not present

## 2021-04-13 DIAGNOSIS — L0231 Cutaneous abscess of buttock: Secondary | ICD-10-CM | POA: Diagnosis not present

## 2021-04-23 DIAGNOSIS — E1042 Type 1 diabetes mellitus with diabetic polyneuropathy: Secondary | ICD-10-CM | POA: Diagnosis not present

## 2021-04-26 DIAGNOSIS — Z9641 Presence of insulin pump (external) (internal): Secondary | ICD-10-CM | POA: Diagnosis not present

## 2021-04-26 DIAGNOSIS — E1042 Type 1 diabetes mellitus with diabetic polyneuropathy: Secondary | ICD-10-CM | POA: Diagnosis not present

## 2021-04-26 DIAGNOSIS — Z79890 Hormone replacement therapy (postmenopausal): Secondary | ICD-10-CM | POA: Diagnosis not present

## 2021-04-26 DIAGNOSIS — Z4681 Encounter for fitting and adjustment of insulin pump: Secondary | ICD-10-CM | POA: Diagnosis not present

## 2021-04-26 DIAGNOSIS — E039 Hypothyroidism, unspecified: Secondary | ICD-10-CM | POA: Diagnosis not present

## 2021-04-27 DIAGNOSIS — E1042 Type 1 diabetes mellitus with diabetic polyneuropathy: Secondary | ICD-10-CM | POA: Diagnosis not present

## 2021-05-01 DIAGNOSIS — D519 Vitamin B12 deficiency anemia, unspecified: Secondary | ICD-10-CM | POA: Diagnosis not present

## 2021-05-06 ENCOUNTER — Other Ambulatory Visit: Payer: Self-pay | Admitting: Cardiology

## 2021-05-06 DIAGNOSIS — I10 Essential (primary) hypertension: Secondary | ICD-10-CM

## 2021-05-06 DIAGNOSIS — I2510 Atherosclerotic heart disease of native coronary artery without angina pectoris: Secondary | ICD-10-CM

## 2021-05-06 DIAGNOSIS — R002 Palpitations: Secondary | ICD-10-CM

## 2021-05-06 DIAGNOSIS — E782 Mixed hyperlipidemia: Secondary | ICD-10-CM

## 2021-05-06 DIAGNOSIS — Z952 Presence of prosthetic heart valve: Secondary | ICD-10-CM

## 2021-05-23 DIAGNOSIS — E785 Hyperlipidemia, unspecified: Secondary | ICD-10-CM | POA: Diagnosis not present

## 2021-05-23 DIAGNOSIS — Z0000 Encounter for general adult medical examination without abnormal findings: Secondary | ICD-10-CM | POA: Diagnosis not present

## 2021-05-23 DIAGNOSIS — Z9181 History of falling: Secondary | ICD-10-CM | POA: Diagnosis not present

## 2021-05-23 DIAGNOSIS — Z1331 Encounter for screening for depression: Secondary | ICD-10-CM | POA: Diagnosis not present

## 2021-06-04 DIAGNOSIS — D519 Vitamin B12 deficiency anemia, unspecified: Secondary | ICD-10-CM | POA: Diagnosis not present

## 2021-07-06 DIAGNOSIS — D519 Vitamin B12 deficiency anemia, unspecified: Secondary | ICD-10-CM | POA: Diagnosis not present

## 2021-07-12 DIAGNOSIS — E1065 Type 1 diabetes mellitus with hyperglycemia: Secondary | ICD-10-CM | POA: Diagnosis not present

## 2021-07-27 DIAGNOSIS — I10 Essential (primary) hypertension: Secondary | ICD-10-CM | POA: Diagnosis not present

## 2021-07-27 DIAGNOSIS — D519 Vitamin B12 deficiency anemia, unspecified: Secondary | ICD-10-CM | POA: Diagnosis not present

## 2021-07-27 DIAGNOSIS — E559 Vitamin D deficiency, unspecified: Secondary | ICD-10-CM | POA: Diagnosis not present

## 2021-07-27 DIAGNOSIS — N1831 Chronic kidney disease, stage 3a: Secondary | ICD-10-CM | POA: Diagnosis not present

## 2021-07-27 DIAGNOSIS — E785 Hyperlipidemia, unspecified: Secondary | ICD-10-CM | POA: Diagnosis not present

## 2021-07-27 DIAGNOSIS — E1022 Type 1 diabetes mellitus with diabetic chronic kidney disease: Secondary | ICD-10-CM | POA: Diagnosis not present

## 2021-07-27 DIAGNOSIS — L0231 Cutaneous abscess of buttock: Secondary | ICD-10-CM | POA: Diagnosis not present

## 2021-07-27 DIAGNOSIS — E039 Hypothyroidism, unspecified: Secondary | ICD-10-CM | POA: Diagnosis not present

## 2021-08-06 DIAGNOSIS — D519 Vitamin B12 deficiency anemia, unspecified: Secondary | ICD-10-CM | POA: Diagnosis not present

## 2021-08-23 DIAGNOSIS — E1042 Type 1 diabetes mellitus with diabetic polyneuropathy: Secondary | ICD-10-CM | POA: Diagnosis not present

## 2021-08-23 DIAGNOSIS — Z4681 Encounter for fitting and adjustment of insulin pump: Secondary | ICD-10-CM | POA: Diagnosis not present

## 2021-08-23 DIAGNOSIS — E039 Hypothyroidism, unspecified: Secondary | ICD-10-CM | POA: Diagnosis not present

## 2021-08-23 DIAGNOSIS — E559 Vitamin D deficiency, unspecified: Secondary | ICD-10-CM | POA: Diagnosis not present

## 2021-08-23 DIAGNOSIS — Z79890 Hormone replacement therapy (postmenopausal): Secondary | ICD-10-CM | POA: Diagnosis not present

## 2021-08-23 DIAGNOSIS — Z9641 Presence of insulin pump (external) (internal): Secondary | ICD-10-CM | POA: Diagnosis not present

## 2021-08-31 ENCOUNTER — Encounter: Payer: Self-pay | Admitting: Cardiology

## 2021-08-31 ENCOUNTER — Ambulatory Visit: Payer: Medicare HMO | Admitting: Cardiology

## 2021-08-31 VITALS — BP 120/60 | HR 76 | Ht 66.5 in | Wt 182.0 lb

## 2021-08-31 DIAGNOSIS — Z952 Presence of prosthetic heart valve: Secondary | ICD-10-CM | POA: Diagnosis not present

## 2021-08-31 DIAGNOSIS — E1042 Type 1 diabetes mellitus with diabetic polyneuropathy: Secondary | ICD-10-CM

## 2021-08-31 DIAGNOSIS — R0989 Other specified symptoms and signs involving the circulatory and respiratory systems: Secondary | ICD-10-CM | POA: Diagnosis not present

## 2021-08-31 DIAGNOSIS — Z951 Presence of aortocoronary bypass graft: Secondary | ICD-10-CM | POA: Diagnosis not present

## 2021-08-31 DIAGNOSIS — I2510 Atherosclerotic heart disease of native coronary artery without angina pectoris: Secondary | ICD-10-CM

## 2021-08-31 DIAGNOSIS — I10 Essential (primary) hypertension: Secondary | ICD-10-CM

## 2021-08-31 DIAGNOSIS — Z789 Other specified health status: Secondary | ICD-10-CM

## 2021-08-31 DIAGNOSIS — E782 Mixed hyperlipidemia: Secondary | ICD-10-CM | POA: Diagnosis not present

## 2021-08-31 DIAGNOSIS — J449 Chronic obstructive pulmonary disease, unspecified: Secondary | ICD-10-CM | POA: Diagnosis not present

## 2021-08-31 NOTE — Patient Instructions (Signed)
Medication Instructions:  Your physician recommends that you continue on your current medications as directed. Please refer to the Current Medication list given to you today.  *If you need a refill on your cardiac medications before your next appointment, please call your pharmacy*   Lab Work: None If you have labs (blood work) drawn today and your tests are completely normal, you will receive your results only by: [place] (if you have MyChart) OR A paper copy in the mail If you have any lab test that is abnormal or we need to change your treatment, we will call you to review the results.   Testing/Procedures: Your physician has requested that you have an echocardiogram. Echocardiography is a painless test that uses sound waves to create images of your heart. It provides your doctor with information about the size and shape of your heart and how well your heart's chambers and valves are working. This procedure takes approximately one hour. There are no restrictions for this procedure.  Your physician has requested that you have a carotid duplex. This test is an ultrasound of the carotid arteries in your neck. It looks at blood flow through these arteries that supply the brain with blood. Allow one hour for this exam. There are no restrictions or special instructions.     Follow-Up: At [facility], you and your health needs are our priority.  As part of our continuing mission to provide you with exceptional heart care, we have created designated Provider Care Teams.  These Care Teams include your primary Cardiologist (physician) and Advanced Practice Providers (APPs -  Physician Assistants and Nurse Practitioners) who all work together to provide you with the care you need, when you need it.  We recommend signing up for the patient portal called "MyChart".  Sign up information is provided on this After Visit Summary.  MyChart is used to connect with patients for Virtual Visits  (Telemedicine).  Patients are able to view lab/test results, encounter notes, upcoming appointments, etc.  Non-urgent messages can be sent to your provider as well.   To learn more about what you can do with MyChart, go to [email or web address].    Your next appointment:   1 year(s)  The format for your next appointment:   In Person  Provider:   Shirlee More, MD    Other Instructions None  Important Information About Sugar

## 2021-08-31 NOTE — Progress Notes (Signed)
Cardiology Office Note:    Date:  08/31/2021   ID:  Jaclyn [name], DOB 05/15/1945, MRN [id number]  PCP:  Lowella Dandy, NP  Cardiologist:  Shirlee More, MD    Referring MD: Lowella Dandy, NP    ASSESSMENT:    1. S/P AVR (aortic valve replacement)   2. Coronary artery disease involving native heart without angina pectoris, unspecified vessel or lesion type   3. Hx of CABG   4. Mixed hyperlipidemia   5. Statin intolerance   6. Benign essential HTN   7. Type 1 diabetes mellitus with diabetic polyneuropathy (HCC)    PLAN:    In order of problems listed above:  She continues to do well now 10 years remote from bioprosthetic AVR recheck echocardiogram and will begin to do yearly after this visit antiplatelet therapy with aspirin continue He has done well after bypass surgery having no angina unfortunately not on lipid-lowering therapy despite my best endeavors. Poorly controlled hyperlipidemia with statin intolerance Hypertension well-controlled continue losartan its been effective Managed by her PCP with insulin pump and CBG monitor Recheck carotid duplex bilateral bruit and stenosis   Next appointment: 1 year   Medication Adjustments/Labs and Tests Ordered: Current medicines are reviewed at length with the patient today.  Concerns regarding medicines are outlined above.  Orders Placed This Encounter  Procedures   EKG 12-Lead   ECHOCARDIOGRAM COMPLETE   VAS US CAROTID   No orders of the defined types were placed in this encounter.   Chief Complaint  Patient presents with   Annual Exam    History of Present Illness:    Jaclyn [name] is a 76 y.o. female with a hx of CAD CABG and bioprosthetic AVR 2013 hypertension dyslipidemia statin intolerance and type 1 diabetes last seen 07/05/2021.  She also has bilateral carotid bruits and mild carotid atherosclerosis.  Statin intolerant and has declined PCSK9 therapy and presently taking Zetia.  Her last carotid duplex  08/21/2020 showed mild to moderate stenosis right internal carotid artery and mild left internal carotid artery.  She had an echocardiogram for valve surveillance 11/19/2017 showing normal bioprosthetic AVR function.  Left ventricular ejection fraction was normal and there is mild mitral and tricuspid regurgitation  Compliance with diet, lifestyle and medications: Yes  She is seen along with her daughter.  Despite my best attempts I could not convince her to accept lipid-lowering therapy. She has had no angina edema shortness of breath chest pain palpitation or syncope With 10-year anniversary from open heart surgery AVR we will recheck echo and recheck her carotid duplex.  Carotid duplex 08/21/2020 with 40 to 59% right ICA stenosis and 1 to 39% left ICA stenosis. Past Medical History:  Diagnosis Date   Abnormal mammogram of both breasts    Acute rhinosinusitis    Acute upper respiratory infection, unspecified    Allergic rhinitis    Allergic rhinitis    Anemia    blood transfusion- 6 yrs. ago by Dr. Ralene Ok    Anxiety    Aortic stenosis    a. 09/11/2011 s/p AVR - 4m Edwards Magna Ease Pericardial Tissue Valve (with CABG x 3 as above).   Aortic stenosis, severe 08/22/2011   AS (aortic stenosis) 08/28/2011   Benign essential HTN 08/07/2015   Body mass index 30.0-30.9, adult    CAD in native artery    [place]    Carotid bruit 06/13/2011   Carotid stenosis, right    Chest pain  Chronic constipation    COPD (chronic obstructive pulmonary disease) (HCC)    Coronary artery disease    a. 09/11/2011 CABGx 3: LIMA->LAD, VG->D1, VG->PLV (with AVR - 4m Edwards Magna Ease Pericardial Tissue Valve).   Coronary atherosclerosis of native coronary artery 08/22/2011   Depression    Dermatitis    DM [place]diabetes mellitus) ([place]    type I - treated with insulin pump /w x165yr  Essential hypertension, benign    Fatigue    Fibrocystic disease of breast    Foot pain    Gastroparesis    Hair  loss    Hematuria    History of lung cancer    HTN (hypertension)    a. 09/2011 BP's running low (in setting of weight loss and elimination of salt from diet).   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    Mass of both breasts on mammogram    Menopausal disorder    Menopause    Microalbuminuria    Murmur    Myalgia    Orthostatic hypotension    Osteoporosis    Palpitations 06/13/2011   Peripheral neuropathy    Personal history of fall    Plantar fasciitis    Positive depression screening    Radiation fibrosis of lung (HCC)    S/P aortic valve replacement with bioprosthetic valve 09/11/2011   [facility] pericardial tissue valve    S/P AVR (aortic valve replacement) 10/07/2011   S/P CABG x 3 09/11/2011   Free LIMA to LAD, SVG to D1, SVG to RPL, EVH via right thigh and leg    S/P radiation therapy    Radiation therapy to chest and mediastinum for small cell carcinoma of the lung   Screening for alcoholism    Seasonal allergies    Seizures ([place]   33 yrs. ago, was on phenobarbital for sometime, off it for 20 yrs.    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   Type 1 diabetes mellitus with diabetic polyneuropathy ([place]/26/2017   Varicose veins of left lower extremity with pain    Vertigo    Visit for screening mammogram    Vitamin D deficiency     Past Surgical History:  Procedure Laterality Date   AORTIC VALVE REPLACEMENT  09/11/2011   [facility]   BREAST BIOPSY     x2, benign   CARPAL TUNNEL RELEASE     bilateral   CATARACT EXTRACTION, BILATERAL Bilateral    CORONARY ARTERY  BYPASS GRAFT  09/11/2011   LIMA to LAD, SVG to D1, SVG to RPL   ELBOW SURGERY     bilateral   MULTIPLE EXTRACTIONS WITH ALVEOLOPLASTY  08/29/2011   Procedure: MULTIPLE EXTRACION WITH ALVEOLOPLASTY;  Surgeon: RoLenn CalDDS;  [facility];  Laterality: N/A;  extraction of teeth # 22, 27 with alveoloplasty   SHOULDER SURGERY     bilateral- RCR-    THYROIDECTOMY     partial- R    TOTAL ABDOMINAL HYSTERECTOMY  age 62    Current Medications: Current Meds  Medication Sig   aspirin EC 81 MG tablet Take 81 mg by mouth daily. Swallow whole.   Cyanocobalamin (B-12 COMPLIANCE INJECTION) 1000 MCG/ML KIT Inject 1,000 mcg as directed every 30 (thirty) days.   insulin regular (NOVOLIN R,HUMULIN R) 100 units/mL injection Sliding Scale   levothyroxine (SYNTHROID) 112 MCG tablet Take 112 mcg by mouth daily before breakfast.   losartan (COZAAR) 25 MG tablet Take 1 tablet by mouth once daily   MAGNESIUM-OXIDE 400 (240 Mg) MG tablet Take 1 tablet by mouth 2 (two) times daily.   Vitamin D, Ergocalciferol, (DRISDOL) 1.25 MG (50000 UNIT) CAPS capsule Take 50,000 Units by mouth once a week.     Allergies:   Statins and Codeine   Social History   Socioeconomic History   Marital status: Divorced    Spouse name: Not on file   Number of children: 2   Years of education: Not on file   Highest education level: GED or equivalent  Occupational History   Not on file  Tobacco Use   Smoking status: Former    Packs/day: 2.00    Years: 43.00    Total pack years: 86.00    Types: Cigarettes    Quit date: 08/27/1997    Years since quitting: 24.0   Smokeless tobacco: Never  Vaping Use   Vaping Use: Never used  Substance and Sexual Activity   Alcohol use: No   Drug use: No   Sexual activity: Not on file  Other Topics Concern   Not on file  Social History Narrative   The patient is divorced.   The patient has 2 daughters. Patient used to smoke between 1-2.5 packs per  day for 43 years.    Patient quit smoking 12 years ago approximately 2001 after her lung cancer diagnosis.   Social Determinants of Health   Financial Resource Strain: Not on file  Food Insecurity: Not on file  Transportation Needs: Not on file  Physical Activity: Not on file  Stress: Not on file  Social Connections: Not on file     Family History: The patient's family history includes Alcohol abuse in her brother; Cancer in her brother and father; Coronary artery disease (age of onset: 86) in her brother; Diabetes Mellitus I in her brother and sister; Heart attack in her brother; Heart disease in her brother, mother, and sister; Heart failure in her mother; Hypertension in her mother; Lymphoma in her brother; Stroke in her sister; Throat cancer in her brother and sister; Thyroid disease in her mother. ROS:   Please see the history of present illness.    All other systems reviewed and are negative.  EKGs/Labs/Other Studies Reviewed:    The following studies were reviewed today:  EKG:  EKG ordered today and personally reviewed.  The ekg ordered today demonstrates sinus rhythm she has intermittent rate related right bundle branch block    Physical Exam:    VS:  BP 120/60 (BP Location: Left Arm, Patient Position: Sitting, Cuff Size: Normal)   Pulse 76   Ht 5' 6.5" (1.689 m)   Wt 182 lb (82.6 kg)   SpO2 95%   BMI 28.94 kg/m     Wt Readings from Last 3 Encounters:  08/31/21 182 lb (82.6 kg)  07/05/20 181 lb (82.1 kg)  03/08/20 178 lb 1.6 oz (80.8 kg)     GEN:  Well nourished, well developed in no acute distress HEENT: Normal NECK: No JVD;  No carotid bruits LYMPHATICS: No lymphadenopathy CARDIAC: RRR, no murmurs, rubs, gallops RESPIRATORY:  Clear to auscultation without rales, wheezing or rhonchi  ABDOMEN: Soft, non-tender, non-distended MUSCULOSKELETAL:  No edema; No deformity  SKIN: Warm and dry NEUROLOGIC:  Alert and oriented x 3 PSYCHIATRIC:  Normal affect     Signed, Shirlee More, MD  08/31/2021 3:56 PM    [facility]

## 2021-09-05 DIAGNOSIS — D519 Vitamin B12 deficiency anemia, unspecified: Secondary | ICD-10-CM | POA: Diagnosis not present

## 2021-09-11 ENCOUNTER — Other Ambulatory Visit: Payer: Self-pay

## 2021-09-11 NOTE — Patient Outreach (Signed)
  Care Coordination   Outreach   Visit Note   09/11/2021 Name: Jaclyn [name] MRN: [id number] DOB: 1945/03/22  Jaclyn [name] is a 76 y.o. year old female who sees Moon, Amy A, NP for primary care. I spoke with  [name] by phone today  What matters to the patients health and wellness today?  Reviewed with patient [facility] care coordination program. Offered services and patient has declined.  Reports she is self managing well.    SDOH assessments and interventions completed:   No   Care Coordination Interventions Activated:  No Care Coordination Interventions:  No, not indicated  Follow up plan:  patient refused services. Encouraged patient to talk to MD if she changes her mind.   Encounter Outcome:  Pt. Refused Tomasa Rand, RN, BSN, CEN [facility] [employer] [phone]

## 2021-09-18 DIAGNOSIS — E1042 Type 1 diabetes mellitus with diabetic polyneuropathy: Secondary | ICD-10-CM | POA: Diagnosis not present

## 2021-09-18 DIAGNOSIS — E039 Hypothyroidism, unspecified: Secondary | ICD-10-CM | POA: Diagnosis not present

## 2021-10-01 DIAGNOSIS — E1065 Type 1 diabetes mellitus with hyperglycemia: Secondary | ICD-10-CM | POA: Diagnosis not present

## 2021-10-10 DIAGNOSIS — D519 Vitamin B12 deficiency anemia, unspecified: Secondary | ICD-10-CM | POA: Diagnosis not present

## 2021-10-12 ENCOUNTER — Telehealth: Payer: Self-pay | Admitting: Cardiology

## 2021-10-12 NOTE — Telephone Encounter (Signed)
Spoke with pt regarding Dr. Joya Gaskins recommendations. She stated that she would like to follow up in the office next week, but would go to the ER if her symptoms worsened or persisted.

## 2021-10-12 NOTE — Telephone Encounter (Signed)
Pt c/o of Chest Pain: STAT if CP now or developed within 24 hours  1. Are you having CP right now? no  2. Are you experiencing any other symptoms (ex. SOB, nausea, vomiting, sweating)? no  3. How long have you been experiencing CP? About a week or two   4. Is your CP continuous or coming and going? Comes and goes   5. Have you taken Nitroglycerin? Doesn't have any.   Patient states it's a sharpe pain in her chest, but is comes and goes.  ?

## 2021-10-12 NOTE — Telephone Encounter (Signed)
Spoke with pt. She stated that she was having "sharp pain in her left and right chest and lung area" off and on. Does not radiate. No sweating. No nausea. No Shortness of breath. She reports she is a diabetic and her blood sugars are all over the place. She stated her Blood pressures and HR have not changed and are normal for her. She does not have any nitro. She was in for an office visit recently.

## 2021-10-16 NOTE — Telephone Encounter (Signed)
Spoke with patient to schedule appointment with Dr. Bettina Gavia. She stated she is ok now- not having any chest pain at this time. She said she will call back if anything changes.

## 2021-11-13 DIAGNOSIS — D519 Vitamin B12 deficiency anemia, unspecified: Secondary | ICD-10-CM | POA: Diagnosis not present

## 2021-12-12 DIAGNOSIS — E1042 Type 1 diabetes mellitus with diabetic polyneuropathy: Secondary | ICD-10-CM | POA: Diagnosis not present

## 2021-12-12 DIAGNOSIS — Z9641 Presence of insulin pump (external) (internal): Secondary | ICD-10-CM | POA: Diagnosis not present

## 2021-12-12 DIAGNOSIS — E559 Vitamin D deficiency, unspecified: Secondary | ICD-10-CM | POA: Diagnosis not present

## 2021-12-12 DIAGNOSIS — E039 Hypothyroidism, unspecified: Secondary | ICD-10-CM | POA: Diagnosis not present

## 2021-12-25 DIAGNOSIS — E1042 Type 1 diabetes mellitus with diabetic polyneuropathy: Secondary | ICD-10-CM | POA: Diagnosis not present

## 2021-12-26 DIAGNOSIS — E1042 Type 1 diabetes mellitus with diabetic polyneuropathy: Secondary | ICD-10-CM | POA: Diagnosis not present

## 2021-12-26 DIAGNOSIS — E1065 Type 1 diabetes mellitus with hyperglycemia: Secondary | ICD-10-CM | POA: Diagnosis not present

## 2021-12-27 DIAGNOSIS — D519 Vitamin B12 deficiency anemia, unspecified: Secondary | ICD-10-CM | POA: Diagnosis not present

## 2022-01-10 DIAGNOSIS — I10 Essential (primary) hypertension: Secondary | ICD-10-CM | POA: Diagnosis not present

## 2022-01-10 DIAGNOSIS — E039 Hypothyroidism, unspecified: Secondary | ICD-10-CM | POA: Diagnosis not present

## 2022-01-10 DIAGNOSIS — N1831 Chronic kidney disease, stage 3a: Secondary | ICD-10-CM | POA: Diagnosis not present

## 2022-01-10 DIAGNOSIS — R5381 Other malaise: Secondary | ICD-10-CM | POA: Diagnosis not present

## 2022-01-10 DIAGNOSIS — R5383 Other fatigue: Secondary | ICD-10-CM | POA: Diagnosis not present

## 2022-01-10 DIAGNOSIS — Z23 Encounter for immunization: Secondary | ICD-10-CM | POA: Diagnosis not present

## 2022-01-10 DIAGNOSIS — D519 Vitamin B12 deficiency anemia, unspecified: Secondary | ICD-10-CM | POA: Diagnosis not present

## 2022-01-10 DIAGNOSIS — I2510 Atherosclerotic heart disease of native coronary artery without angina pectoris: Secondary | ICD-10-CM | POA: Diagnosis not present

## 2022-01-10 DIAGNOSIS — E1022 Type 1 diabetes mellitus with diabetic chronic kidney disease: Secondary | ICD-10-CM | POA: Diagnosis not present

## 2022-01-29 DIAGNOSIS — D519 Vitamin B12 deficiency anemia, unspecified: Secondary | ICD-10-CM | POA: Diagnosis not present

## 2022-03-05 DIAGNOSIS — D519 Vitamin B12 deficiency anemia, unspecified: Secondary | ICD-10-CM | POA: Diagnosis not present

## 2022-03-20 DIAGNOSIS — E039 Hypothyroidism, unspecified: Secondary | ICD-10-CM | POA: Diagnosis not present

## 2022-03-20 DIAGNOSIS — Z9641 Presence of insulin pump (external) (internal): Secondary | ICD-10-CM | POA: Diagnosis not present

## 2022-03-20 DIAGNOSIS — E559 Vitamin D deficiency, unspecified: Secondary | ICD-10-CM | POA: Diagnosis not present

## 2022-03-20 DIAGNOSIS — E1042 Type 1 diabetes mellitus with diabetic polyneuropathy: Secondary | ICD-10-CM | POA: Diagnosis not present

## 2022-03-26 DIAGNOSIS — E1042 Type 1 diabetes mellitus with diabetic polyneuropathy: Secondary | ICD-10-CM | POA: Diagnosis not present

## 2022-03-26 DIAGNOSIS — E1065 Type 1 diabetes mellitus with hyperglycemia: Secondary | ICD-10-CM | POA: Diagnosis not present

## 2022-04-04 DIAGNOSIS — D519 Vitamin B12 deficiency anemia, unspecified: Secondary | ICD-10-CM | POA: Diagnosis not present

## 2022-04-16 ENCOUNTER — Telehealth: Payer: Self-pay

## 2022-04-16 NOTE — Patient Outreach (Signed)
  Care Coordination   Initial Visit Note   04/16/2022 Name: Jaclyn [name] MRN: [id number] DOB: [birth year]  Jaclyn [name] is [name] 77 y.o. year old female who sees Moon, Amy A, NP for primary care. I spoke with  Jaclyn [name] by phone today.  What matters to the patients health and wellness today?  Placed call to patient to review and offer [name] care coordination program. Patient reports that she is doing well. Reports she just got her labs back and has [name] few questions.     Goals Addressed               This Visit's Progress     I have quesions about my labs (pt-stated)        Interventions Today    Flowsheet Row Most Recent Value  Chronic Disease   Chronic disease during today's visit Diabetes  General Interventions   General Interventions Discussed/Reviewed Labs  [Patient with questions about her labs.  Reviewed epic and answered general questions. Patient concerns that her goiter is growing. I encouraged patient to discuss with MD.]  Labs Kidney Function, Hgb A1c every 3 months              SDOH assessments and interventions completed:  No     Care Coordination Interventions:  Yes, provided   Follow up plan: No further intervention required.   Encounter Outcome:  Pt. Visit Completed   Tomasa Rand, RN, BSN, CEN [place] Coordinator [phone]

## 2022-04-18 DIAGNOSIS — R220 Localized swelling, mass and lump, head: Secondary | ICD-10-CM | POA: Diagnosis not present

## 2022-04-18 DIAGNOSIS — I10 Essential (primary) hypertension: Secondary | ICD-10-CM | POA: Diagnosis not present

## 2022-04-18 DIAGNOSIS — R221 Localized swelling, mass and lump, neck: Secondary | ICD-10-CM | POA: Diagnosis not present

## 2022-04-24 DIAGNOSIS — R221 Localized swelling, mass and lump, neck: Secondary | ICD-10-CM | POA: Diagnosis not present

## 2022-04-24 DIAGNOSIS — R220 Localized swelling, mass and lump, head: Secondary | ICD-10-CM | POA: Diagnosis not present

## 2022-05-09 DIAGNOSIS — D519 Vitamin B12 deficiency anemia, unspecified: Secondary | ICD-10-CM | POA: Diagnosis not present

## 2022-06-04 DIAGNOSIS — R221 Localized swelling, mass and lump, neck: Secondary | ICD-10-CM | POA: Diagnosis not present

## 2022-06-04 DIAGNOSIS — K118 Other diseases of salivary glands: Secondary | ICD-10-CM | POA: Diagnosis not present

## 2022-06-11 DIAGNOSIS — D519 Vitamin B12 deficiency anemia, unspecified: Secondary | ICD-10-CM | POA: Diagnosis not present

## 2022-06-14 DIAGNOSIS — E1042 Type 1 diabetes mellitus with diabetic polyneuropathy: Secondary | ICD-10-CM | POA: Diagnosis not present

## 2022-06-18 ENCOUNTER — Other Ambulatory Visit (HOSPITAL_COMMUNITY): Payer: Self-pay | Admitting: Otolaryngology

## 2022-06-18 DIAGNOSIS — R221 Localized swelling, mass and lump, neck: Secondary | ICD-10-CM

## 2022-06-20 ENCOUNTER — Encounter: Payer: Self-pay | Admitting: General Practice

## 2022-06-20 NOTE — Progress Notes (Signed)
Berdine Dance, MD  Caroleen Hamman, NT Ok for [place] CORE BX RT PAROTID 1.5 CM MASS  See neck CT 04/24/22  TS       Previous Messages    ----- Message ----- From: Caroleen Hamman, NT Sent: 06/18/2022   2:31 PM EDT To: Ir Procedure Requests Subject: [place] CORE BIOPSY (SALIVARY GLAND/PAROTID GLAND)  Procedure: [place] CORE BIOPSY (SALIVARY GLAND/PAROTID GLAND)  Reason: Localized swelling, mass and lump, neck  [facility] [place] [facility]  Provider: Laren Boom, DO  Contact: [phone]

## 2022-06-21 DIAGNOSIS — E1042 Type 1 diabetes mellitus with diabetic polyneuropathy: Secondary | ICD-10-CM | POA: Diagnosis not present

## 2022-06-21 DIAGNOSIS — E039 Hypothyroidism, unspecified: Secondary | ICD-10-CM | POA: Diagnosis not present

## 2022-06-21 DIAGNOSIS — Z9641 Presence of insulin pump (external) (internal): Secondary | ICD-10-CM | POA: Diagnosis not present

## 2022-06-24 DIAGNOSIS — E1065 Type 1 diabetes mellitus with hyperglycemia: Secondary | ICD-10-CM | POA: Diagnosis not present

## 2022-07-05 ENCOUNTER — Other Ambulatory Visit: Payer: Self-pay | Admitting: Internal Medicine

## 2022-07-09 ENCOUNTER — Ambulatory Visit (HOSPITAL_COMMUNITY)
Admission: RE | Admit: 2022-07-09 | Discharge: 2022-07-09 | Disposition: A | Discharge status: Home / Self Care | Payer: Medicare HMO | Source: Ambulatory Visit | Attending: Otolaryngology | Admitting: Otolaryngology

## 2022-07-09 DIAGNOSIS — R221 Localized swelling, mass and lump, neck: Secondary | ICD-10-CM | POA: Diagnosis not present

## 2022-07-09 DIAGNOSIS — D110 Benign neoplasm of parotid gland: Secondary | ICD-10-CM | POA: Insufficient documentation

## 2022-07-09 DIAGNOSIS — K118 Other diseases of salivary glands: Secondary | ICD-10-CM | POA: Insufficient documentation

## 2022-07-09 MED ORDER — LIDOCAINE HCL (PF) 1 % IJ SOLN
5.0000 mL | Freq: Once | INTRAMUSCULAR | Status: AC
  Administered 2022-07-09: 5 mL via INTRADERMAL

## 2022-07-09 NOTE — Procedures (Signed)
Interventional Radiology Procedure Note  Procedure:   US guided right parotid mass biopsy.  .  Complications: None Recommendations:  - Ok to shower tomorrow - Do not submerge for 7 days - Routine wound care   Signed,  Yvone Neu. Loreta Ave, DO

## 2022-07-10 LAB — SURGICAL PATHOLOGY

## 2022-07-11 DIAGNOSIS — Z139 Encounter for screening, unspecified: Secondary | ICD-10-CM | POA: Diagnosis not present

## 2022-07-11 DIAGNOSIS — I2510 Atherosclerotic heart disease of native coronary artery without angina pectoris: Secondary | ICD-10-CM | POA: Diagnosis not present

## 2022-07-11 DIAGNOSIS — E1022 Type 1 diabetes mellitus with diabetic chronic kidney disease: Secondary | ICD-10-CM | POA: Diagnosis not present

## 2022-07-11 DIAGNOSIS — D519 Vitamin B12 deficiency anemia, unspecified: Secondary | ICD-10-CM | POA: Diagnosis not present

## 2022-07-11 DIAGNOSIS — E039 Hypothyroidism, unspecified: Secondary | ICD-10-CM | POA: Diagnosis not present

## 2022-07-11 DIAGNOSIS — I10 Essential (primary) hypertension: Secondary | ICD-10-CM | POA: Diagnosis not present

## 2022-07-11 DIAGNOSIS — Z9181 History of falling: Secondary | ICD-10-CM | POA: Diagnosis not present

## 2022-07-11 DIAGNOSIS — K118 Other diseases of salivary glands: Secondary | ICD-10-CM | POA: Diagnosis not present

## 2022-07-11 DIAGNOSIS — N1831 Chronic kidney disease, stage 3a: Secondary | ICD-10-CM | POA: Diagnosis not present

## 2022-08-05 ENCOUNTER — Telehealth: Payer: Self-pay | Admitting: Cardiology

## 2022-08-05 ENCOUNTER — Other Ambulatory Visit: Payer: Self-pay

## 2022-08-05 DIAGNOSIS — Z952 Presence of prosthetic heart valve: Secondary | ICD-10-CM

## 2022-08-05 DIAGNOSIS — R002 Palpitations: Secondary | ICD-10-CM

## 2022-08-05 NOTE — Telephone Encounter (Signed)
Patient c/o Palpitations:  High priority if patient c/o lightheadedness, shortness of breath, or chest pain  How long have you had palpitations/irregular HR/ Afib? Are you having the symptoms now? Heart been beating real   weird  Are you currently experiencing lightheadedness, SOB or CP? Chest been hurting sometimes, short of breath when she lays down at night   Do you have a history of afib (atrial fibrillation) or irregular heart rhythm?   Have you checked your BP or HR? (document readings if available): bottom number low in the 50's  Are you experiencing any other symptoms?  Patient wants to be seenfirat appointment available is in July and she has an appointmemt for July- I di

## 2022-08-05 NOTE — Telephone Encounter (Signed)
Called patient and she reported that she was having chest pains on the left upper chest and on the right side of her chest when she laid down at night. She also becomes SOB when she lays down at night. On her latest blood pressure her diastolic was in the 50's. Spoke to Dr. Dulce Sellar regarding the patient's symptoms and he recommended that the patient wear a zio monitor for 1 week, have an echo and to keep her appointment with Dr. Dulce Sellar on 09/04/22 at 11:00 am. I called the patient and relayed Dr. Hulen Shouts recommendations to the patient and she was agreeable to this plan and had no further questions at this time.

## 2022-08-12 NOTE — Telephone Encounter (Signed)
Called patient to find out if she was willing to have her echo and carotid ultrasound done at [facility]. Patient was agreeable to having the test completed at [facility]. Forms to have the test completed at [facility] completed and sent to pre-cert. Patient had no further questions at this time.

## 2022-08-12 NOTE — Telephone Encounter (Signed)
Per Richard Echo and Carotid will be done at [facility]- I will call today and schedule monitor visit  KBL 08/12/22

## 2022-08-13 ENCOUNTER — Ambulatory Visit: Payer: Medicare HMO | Attending: Cardiology

## 2022-08-13 DIAGNOSIS — R002 Palpitations: Secondary | ICD-10-CM

## 2022-08-13 DIAGNOSIS — Z952 Presence of prosthetic heart valve: Secondary | ICD-10-CM

## 2022-08-14 DIAGNOSIS — D519 Vitamin B12 deficiency anemia, unspecified: Secondary | ICD-10-CM | POA: Diagnosis not present

## 2022-08-23 DIAGNOSIS — Z952 Presence of prosthetic heart valve: Secondary | ICD-10-CM | POA: Diagnosis not present

## 2022-08-23 DIAGNOSIS — R0600 Dyspnea, unspecified: Secondary | ICD-10-CM | POA: Diagnosis not present

## 2022-08-23 DIAGNOSIS — I6523 Occlusion and stenosis of bilateral carotid arteries: Secondary | ICD-10-CM | POA: Diagnosis not present

## 2022-08-23 DIAGNOSIS — R002 Palpitations: Secondary | ICD-10-CM | POA: Diagnosis not present

## 2022-08-23 DIAGNOSIS — I340 Nonrheumatic mitral (valve) insufficiency: Secondary | ICD-10-CM | POA: Diagnosis not present

## 2022-08-23 DIAGNOSIS — I361 Nonrheumatic tricuspid (valve) insufficiency: Secondary | ICD-10-CM | POA: Diagnosis not present

## 2022-08-26 DIAGNOSIS — E119 Type 2 diabetes mellitus without complications: Secondary | ICD-10-CM | POA: Diagnosis not present

## 2022-08-26 DIAGNOSIS — I10 Essential (primary) hypertension: Secondary | ICD-10-CM | POA: Diagnosis not present

## 2022-08-26 DIAGNOSIS — E785 Hyperlipidemia, unspecified: Secondary | ICD-10-CM | POA: Diagnosis not present

## 2022-08-26 DIAGNOSIS — I6523 Occlusion and stenosis of bilateral carotid arteries: Secondary | ICD-10-CM | POA: Diagnosis not present

## 2022-09-02 ENCOUNTER — Other Ambulatory Visit: Payer: Self-pay

## 2022-09-02 DIAGNOSIS — D649 Anemia, unspecified: Secondary | ICD-10-CM | POA: Insufficient documentation

## 2022-09-03 NOTE — Progress Notes (Unsigned)
Cardiology Office Note:    Date:  09/04/2022   ID:  Jaclyn [name], DOB 1945/08/11, MRN [id number]  PCP:  Jaclyn Party, NP  Cardiologist:  Jaclyn Herrlich, MD    Referring MD: Jaclyn Party, NP    ASSESSMENT:    1. Hx of CABG   2. Coronary artery disease involving native heart without angina pectoris, unspecified vessel or lesion type   3. S/P AVR (aortic valve replacement)   4. Mixed hyperlipidemia   5. Statin intolerance   6. Bilateral carotid bruits    PLAN:    In order of problems listed above:  Becomes obvious the primary problem here is profound family stress stable CAD normal prosthetic AVR function and carotid disease is not severe Event monitor shows no concerning arrhythmia She responded well to reassurance today Continue her current medical regimen including aspirin antihypertensive oral magnesium she is not on lipid lowering therapy Carotid duplex 1 year   Next appointment: 6 months   Medication Adjustments/Labs and Tests Ordered: Current medicines are reviewed at length with the patient today.  Concerns regarding medicines are outlined above.  Orders Placed This Encounter  Procedures   EKG 12-Lead   No orders of the defined types were placed in this encounter.    History of Present Illness:    Jaclyn [name] is a 77 y.o. female with a hx of CAD CABG and bioprosthetic AVR in 2013 hypertension dyslipidemia with statin intolerance type 1 diabetes bilateral carotid bruit with mild to moderate right ICA stenosis and mild left ICA stenosis last seen 07/01/11/2022.  She contacted our office service 08/05/2022 with complaints of palp potation.  She had an echocardiogram at [facility] 08/23/2022 showing normal left ventricular ejection fraction 55 to 60% bioprosthetic aortic valve with normal function mild mitral and mild tricuspid regurgitation.  Carotid duplex 08/26/2022 showed greater than 70% stenosis of the right internal carotid artery 50% left  internal carotid artery stenosis.  She had an event monitor reported 08/26/2022 both supraventricular and ventricular ectopy were rare however she had brief episodes of atrial tachycardia and symptomatic PVCs and APCs.  Compliance with diet, lifestyle and medications: Yes  She has been under intense stress several deaths in her family including a 48 year old grandson that she had raised She acknowledges stress has affected her and made her aware of her heart She is very reassured by the results of her echocardiogram normal bioprosthetic AVR function ejection fraction and reassuring monitor Past Medical History:  Diagnosis Date   Abnormal mammogram of both breasts    Acute rhinosinusitis    Acute upper respiratory infection, unspecified    Allergic rhinitis    Anemia    blood transfusion- 6 yrs. ago by Dr. Arline Asp    Anemia due to infection 11/13/2019   Resolved with B12 injections, iron in diet and Rx of pneumonia     Anxiety    Aortic stenosis    a. 09/11/2011 s/p AVR - 21mm [employer] Ease Pericardial Tissue Valve (with CABG x 3 as above).   Aortic stenosis, severe 08/22/2011   AS (aortic stenosis) 08/28/2011   Benign essential HTN 08/07/2015   Body mass index 30.0-30.9, adult    CAD in native artery    Cancer (HCC)    Carotid bruit 06/13/2011   Carotid stenosis, right    Chest pain    Chronic constipation    COPD (chronic obstructive pulmonary disease) (HCC)    Coronary artery disease    a. 09/11/2011  CABGx 3: LIMA->LAD, VG->D1, VG->PLV (with AVR - 21mm [employer] Magna Ease Pericardial Tissue Valve).   Coronary atherosclerosis of native coronary artery 08/22/2011   Depression    Dermatitis    Diabetic polyneuropathy associated with type 1 diabetes mellitus (HCC) 08/07/2015   DM (diabetes mellitus) (HCC)    type I - treated with insulin pump /w x66yrs   Essential hypertension, benign    Fatigue    Fibrocystic disease of breast    Foot pain    Gastroparesis    Hair loss     Hematuria    History of lung cancer    HTN (hypertension)    a. 09/2011 BP's running low (in setting of weight loss and elimination of salt from diet).   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    Mass of both breasts on mammogram    Menopausal disorder    Menopause    Microalbuminuria    Murmur    Myalgia    Orthostatic hypotension    Osteoporosis    Palpitations 06/13/2011   Peripheral neuropathy    Personal history of fall    Plantar fasciitis    Positive depression screening    Radiation fibrosis of lung (HCC)    S/P aortic valve replacement with bioprosthetic valve 09/11/2011   21 mm [facility] Ease pericardial tissue valve    S/P AVR (aortic valve replacement) 10/07/2011   S/P CABG x 3 09/11/2011   Free LIMA to LAD, SVG to D1, SVG to RPL, EVH via right thigh and leg    S/P radiation therapy    Radiation therapy to chest and mediastinum for small cell carcinoma of the lung   Screening for alcoholism    Seasonal allergies    Seizures (HCC)    33 yrs. ago, was on phenobarbital for sometime, off it for 20 yrs.    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 Swallow whole.   Cyanocobalamin (B-12 COMPLIANCE INJECTION) 1000 MCG/ML KIT Inject 1,000 mcg as directed every 30 (thirty) days.   insulin regular (NOVOLIN R,HUMULIN R)  100 units/mL injection Sliding Scale   levothyroxine (SYNTHROID) 137 MCG tablet Take 137 mcg by mouth every morning.   losartan (COZAAR) 25 MG tablet Take 1 tablet by mouth once daily   MAGNESIUM-OXIDE 400 (240 Mg) MG tablet Take 1 tablet by mouth 2 (two) times daily.   Vitamin D, Ergocalciferol, (DRISDOL) 1.25 MG (50000 UNIT) CAPS capsule Take 50,000 Units by mouth once a week.      EKGs/Labs/Other Studies Reviewed:    The following studies were reviewed today:  Cardiac Studies & Procedures       ECHOCARDIOGRAM  ECHOCARDIOGRAM COMPLETE 11/19/2017  Narrative *[facility]* [address] [place], [place] [address] [phone]  ------------------------------------------------------------------- Transthoracic Echocardiography  Patient:    Hadassa, Cermak MR #:       [id number] Study Date: 11/19/2017 Gender:     F Age:        51 Height:     167.6 cm Weight:     85.2 kg BSA:        2.02 m^2 Pt. Status: Room:  SONOGRAPHER  [place], Will Asencion Partridge, Jennet Maduro REFERRING    Rosalio Macadamia REFERRING    Moon, Amy A PERFORMING   [place], Outpatient ATTENDING    Chilton Si, MD  cc:  ------------------------------------------------------------------- LV EF: 55% -   60%  ------------------------------------------------------------------- Indications:      (I25.10).  ------------------------------------------------------------------- History:   PMH:  AVR (21 mm [facility] Ease pericardial valve) Acquired from the patient and from the patient&'s chart.  Coronary artery disease.  Risk factors:  Hypertension. Diabetes mellitus. Dyslipidemia.  ------------------------------------------------------------------- Study Conclusions  - Left ventricle: The cavity size was normal. Systolic function was normal. The estimated ejection fraction was in the range of 55% to 60%. Wall motion was normal; there were no regional wall motion abnormalities.  Features are consistent with a pseudonormal left ventricular filling pattern, with concomitant abnormal relaxation and increased filling pressure (grade 2 diastolic dysfunction). Doppler parameters are consistent with high ventricular filling pressure. - Aortic valve: A bioprosthesis was present and functioning normally. Transvalvular velocity was within the normal range. There was no stenosis. There was no regurgitation. Mean gradient (S): 12 mm Hg. - Mitral valve: Transvalvular velocity was within the normal range. There was no evidence for stenosis. There was mild regurgitation. - Right ventricle: The cavity size was normal. Wall thickness was normal. Systolic function was normal. - Atrial septum: No defect or patent foramen ovale was identified by color flow Doppler. - Tricuspid valve: There was mild regurgitation. - Pulmonary arteries: Systolic pressure was within the normal range. PA peak pressure: 32 mm Hg (S).  ------------------------------------------------------------------- Labs, prior tests, procedures, and surgery: Coronary artery bypass grafting.  ------------------------------------------------------------------- Study data:  Comparison was made to the study of 11/22/2014.  Study status:  Routine.  Procedure:  The patient reported no pain pre or post test. Transthoracic echocardiography for left ventricular function evaluation. Image quality was adequate.  Study completion: There were no complications.          Transthoracic echocardiography.  M-mode, complete 2D, spectral Doppler, and color Doppler.  Birthdate:  Patient birthdate: 1945/04/16.  Age:  Patient is 77 yr old.  Sex:  Gender: female.    BMI: 30.3 kg/m^2.  Blood pressure:     152/62  Patient status:  Outpatient.  Study date: Study date: 11/19/2017. Study time: 11:43 AM.  Location:  [facility]  -------------------------------------------------------------------  ------------------------------------------------------------------- Left ventricle:  The cavity size was normal. Systolic function was normal. The estimated ejection fraction was in the range of 55% to 60%. Wall motion was normal; there were no regional wall motion abnormalities. Features are consistent with a pseudonormal left ventricular filling pattern, with concomitant abnormal relaxation and increased filling pressure (grade 2 diastolic dysfunction). Doppler parameters are consistent with high ventricular filling pressure.  ------------------------------------------------------------------- Aortic valve:  A bioprosthesis was present and functioning normally. Mobility was not restricted.  Doppler:  Transvalvular velocity was within the normal range. There was no stenosis. There was no regurgitation.    VTI ratio of LVOT to aortic valve: 0.45. Valve area (VTI): 1.29  cm^2. Indexed valve area (VTI): 0.64 cm^2/m^2. Peak velocity ratio of LVOT to aortic valve: 0.43. Valve area (Vmax): 1.21 cm^2. Indexed valve area (Vmax): 0.6 cm^2/m^2. Mean velocity ratio of LVOT to aortic valve: 0.43. Valve area (Vmean): 1.22 cm^2. Indexed valve area (Vmean): 0.6 cm^2/m^2. Mean gradient (S): 12 mm Hg. Peak gradient (S): 24 mm Hg.  ------------------------------------------------------------------- Aorta:  Aortic root: The aortic root was normal in size.  ------------------------------------------------------------------- Mitral valve:   Structurally normal valve.   Mobility was not restricted.  Doppler:  Transvalvular velocity was within the normal range. There was no evidence for stenosis. There was mild regurgitation.    Valve area by pressure half-time: 3.93 cm^2. Indexed valve area by pressure half-time: 1.95 cm^2/m^2.    Peak gradient (D): 5 mm  Hg.  ------------------------------------------------------------------- Left atrium:  The atrium was normal in size.  ------------------------------------------------------------------- Atrial septum:  No defect or patent foramen ovale was identified by color flow Doppler.  ------------------------------------------------------------------- Right ventricle:  The cavity size was normal. Wall thickness was normal. Systolic function was normal.  ------------------------------------------------------------------- Pulmonic valve:    Structurally normal valve.   Cusp separation was normal.  Doppler:  Transvalvular velocity was within the normal range. There was no evidence for stenosis. There was no regurgitation.  ------------------------------------------------------------------- Tricuspid valve:   Structurally normal valve.    Doppler: Transvalvular velocity was within the normal range. There was mild regurgitation.  ------------------------------------------------------------------- Pulmonary artery:   The main pulmonary artery was normal-sized. Systolic pressure was within the normal range.  ------------------------------------------------------------------- Right atrium:  The atrium was normal in size.  ------------------------------------------------------------------- Pericardium:  There was no pericardial effusion.  ------------------------------------------------------------------- Systemic veins: Inferior vena cava: The vessel was normal in size. The respirophasic diameter changes were in the normal range (= 50%), consistent with normal central venous pressure.  ------------------------------------------------------------------- Measurements  Left ventricle                           Value          Reference LV ID, ED, PLAX chordal          (L)     35    mm       43 - 52 LV ID, ES, PLAX chordal                  25    mm       23 - 38 LV fx shortening, PLAX chordal            29    %        >=29 LV PW thickness, ED                      12    mm       ---------- IVS/LV PW ratio, ED                      1              <=1.3 Stroke volume, 2D                        66    ml       ---------- Stroke volume/bsa, 2D                    33    ml/m^2   ---------- LV e&', lateral  9.46  cm/s     ---------- LV E/e&', lateral                         12.37          ---------- LV e&', medial                            6.96  cm/s     ---------- LV E/e&', medial                          16.81          ---------- LV e&', average                           8.21  cm/s     ---------- LV E/e&', average                         14.25          ----------  Ventricular septum                       Value          Reference IVS thickness, ED                        12    mm       ----------  LVOT                                     Value          Reference LVOT ID, S                               19    mm       ---------- LVOT area                                2.84  cm^2     ---------- LVOT ID                                  19    mm       ---------- LVOT peak velocity, S                    104   cm/s     ---------- LVOT mean velocity, S                    68.5  cm/s     ---------- LVOT VTI, S                              23.4  cm       ---------- Stroke volume (SV), LVOT DP              66.3  ml       ---------- Stroke index (SV/bsa), LVOT DP  32.9  ml/m^2   ----------  Aortic valve                             Value          Reference Aortic valve peak velocity, S            244   cm/s     ---------- Aortic valve mean velocity, S            160   cm/s     ---------- Aortic valve VTI, S                      51.5  cm       ---------- Aortic mean gradient, S                  12    mm Hg    ---------- Aortic peak gradient, S                  24    mm Hg    ---------- VTI ratio, LVOT/AV                       0.45           ---------- Aortic valve  area, VTI                   1.29  cm^2     ---------- Aortic valve area/bsa, VTI               0.64  cm^2/m^2 ---------- Velocity ratio, peak, LVOT/AV            0.43           ---------- Aortic valve area, peak velocity         1.21  cm^2     ---------- Aortic valve area/bsa, peak              0.6   cm^2/m^2 ---------- velocity Velocity ratio, mean, LVOT/AV            0.43           ---------- Aortic valve area, mean velocity         1.22  cm^2     ---------- Aortic valve area/bsa, mean              0.6   cm^2/m^2 ---------- velocity  Aorta                                    Value          Reference Aortic root ID, ED                       30    mm       ----------  Left atrium                              Value          Reference LA ID, A-P, ES                           33    mm       ---------- LA ID/bsa,  A-P                           1.64  cm/m^2   <=2.2 LA volume, S                             50.2  ml       ---------- LA volume/bsa, S                         24.9  ml/m^2   ---------- LA volume, ES, 1-p A4C                   55.7  ml       ---------- LA volume/bsa, ES, 1-p A4C               27.6  ml/m^2   ---------- LA volume, ES, 1-p A2C                   45.4  ml       ---------- LA volume/bsa, ES, 1-p A2C               22.5  ml/m^2   ----------  Mitral valve                             Value          Reference Mitral E-wave peak velocity              117   cm/s     ---------- Mitral A-wave peak velocity              103   cm/s     ---------- Mitral deceleration time                 190   ms       150 - 230 Mitral pressure half-time                56    ms       ---------- Mitral peak gradient, D                  5     mm Hg    ---------- Mitral E/A ratio, peak                   1.1            ---------- Mitral valve area, PHT, DP               3.93  cm^2     ---------- Mitral valve area/bsa, PHT, DP           1.95  cm^2/m^2 ----------  Pulmonary arteries                        Value          Reference PA pressure, S, DP               (H)     32    mm Hg    <=30  Tricuspid valve                          Value  Reference Tricuspid regurg peak velocity           271   cm/s     ---------- Tricuspid peak RV-RA gradient            29    mm Hg    ----------  Right atrium                             Value          Reference RA ID, S-I, ES, A4C              (H)     54.3  mm       34 - 49 RA area, ES, A4C                         16.7  cm^2     8.3 - 19.5 RA volume, ES, A/L                       44    ml       ---------- RA volume/bsa, ES, A/L                   21.8  ml/m^2   ----------  Systemic veins                           Value          Reference Estimated CVP                            3     mm Hg    ----------  Right ventricle                          Value          Reference TAPSE                                    12.6  mm       ---------- RV pressure, S, DP               (H)     32    mm Hg    <=30 RV s&', lateral, S                        9.46  cm/s     ----------  Legend: (L)  and  (H)  mark values outside specified reference range.  ------------------------------------------------------------------- Prepared and Electronically Authenticated by  Chilton Si, MD 2019-10-09T15:29:40    MONITORS  LONG TERM MONITOR (3-14 DAYS) 08/26/2022  Narrative Patch Wear Time:  6 days and 22 hours (2024-07-02T10:32:57-0400 to 2024-07-09T09:08:35-0400)  Patient had a min HR of 67 bpm, max HR of 152 bpm, and avg HR of 83 bpm. Predominant underlying rhythm was Sinus Rhythm.  There were no sinus pauses of 3 seconds or greater and no episodes of second or third-degree AV nodal block.  There were 52 triggered events all sinus rhythm.  At times PVCs were present usually isolated but on 1 occasion frequent.  At times APCs were noted usually a single beat but there were 2 brief episodes of  SVT that were triggered.  3 Supraventricular Tachycardia runs  occurred, the run with the fastest interval lasting 5 beats with a max rate of 152 bpm, the longest lasting 7 beats with an avg rate of 135 bpm.  There were no episodes of atrial fibrillation or flutter  Isolated SVEs were rare (<1.0%), SVE Couplets were rare (<1.0%), and SVE Triplets were rare (<1.0%).  Isolated VEs were rare (<1.0%), VE Couplets were rare (<1.0%), and no VE Triplets were present.          EKG Interpretation Date/Time:  Wednesday September 04 2022 11:01:59 EDT Ventricular Rate:  85 PR Interval:  154 QRS Duration:  74 QT Interval:  368 QTC Calculation: 437 R Axis:   60  Text Interpretation: Normal sinus rhythm Normal ECG When compared with ECG of 24-Sep-2011 11:28, Premature ventricular complexes are no longer Present T wave inversion no longer evident in Anterior leads Confirmed by Jaclyn [name] ([id number]) on 09/04/2022 11:08:50 AM      Physical Exam:    VS:  BP (!) 100/50   Pulse 85   Ht 5\' 6"  (1.676 m)   Wt 181 lb 6.4 oz (82.3 kg)   SpO2 93%   BMI 29.28 kg/m     Wt Readings from Last 3 Encounters:  09/04/22 181 lb 6.4 oz (82.3 kg)  08/31/21 182 lb (82.6 kg)  07/05/20 181 lb (82.1 kg)     GEN:  Well nourished, well developed in no acute distress HEENT: Normal NECK: No JVD; No carotid bruits LYMPHATICS: No lymphadenopathy CARDIAC: Systolic ejection murmur secondary to AVR no aortic regurgitation RRR,  RESPIRATORY:  Clear to auscultation without rales, wheezing or rhonchi  ABDOMEN: Soft, non-tender, non-distended MUSCULOSKELETAL:  No edema; No deformity  SKIN: Warm and dry NEUROLOGIC:  Alert and oriented x 3 PSYCHIATRIC:  Normal affect    Signed, Jaclyn Herrlich, MD  09/04/2022 11:19 AM    [facility]

## 2022-09-04 ENCOUNTER — Ambulatory Visit: Payer: Medicare HMO | Attending: Cardiology | Admitting: Cardiology

## 2022-09-04 ENCOUNTER — Encounter: Payer: Self-pay | Admitting: Cardiology

## 2022-09-04 VITALS — BP 100/50 | HR 85 | Ht 66.0 in | Wt 181.4 lb

## 2022-09-04 DIAGNOSIS — Z952 Presence of prosthetic heart valve: Secondary | ICD-10-CM

## 2022-09-04 DIAGNOSIS — Z789 Other specified health status: Secondary | ICD-10-CM | POA: Diagnosis not present

## 2022-09-04 DIAGNOSIS — E782 Mixed hyperlipidemia: Secondary | ICD-10-CM | POA: Diagnosis not present

## 2022-09-04 DIAGNOSIS — J701 Chronic and other pulmonary manifestations due to radiation: Secondary | ICD-10-CM | POA: Diagnosis not present

## 2022-09-04 DIAGNOSIS — Z951 Presence of aortocoronary bypass graft: Secondary | ICD-10-CM

## 2022-09-04 DIAGNOSIS — R0989 Other specified symptoms and signs involving the circulatory and respiratory systems: Secondary | ICD-10-CM | POA: Diagnosis not present

## 2022-09-04 DIAGNOSIS — I2510 Atherosclerotic heart disease of native coronary artery without angina pectoris: Secondary | ICD-10-CM

## 2022-09-04 DIAGNOSIS — J449 Chronic obstructive pulmonary disease, unspecified: Secondary | ICD-10-CM | POA: Diagnosis not present

## 2022-09-04 NOTE — Patient Instructions (Addendum)

## 2022-09-11 ENCOUNTER — Encounter: Payer: Self-pay | Admitting: Cardiology

## 2022-09-17 DIAGNOSIS — K118 Other diseases of salivary glands: Secondary | ICD-10-CM | POA: Diagnosis not present

## 2022-09-17 DIAGNOSIS — I2510 Atherosclerotic heart disease of native coronary artery without angina pectoris: Secondary | ICD-10-CM | POA: Diagnosis not present

## 2022-09-17 DIAGNOSIS — E039 Hypothyroidism, unspecified: Secondary | ICD-10-CM | POA: Diagnosis not present

## 2022-09-17 DIAGNOSIS — Z139 Encounter for screening, unspecified: Secondary | ICD-10-CM | POA: Diagnosis not present

## 2022-09-17 DIAGNOSIS — N1831 Chronic kidney disease, stage 3a: Secondary | ICD-10-CM | POA: Diagnosis not present

## 2022-09-17 DIAGNOSIS — D519 Vitamin B12 deficiency anemia, unspecified: Secondary | ICD-10-CM | POA: Diagnosis not present

## 2022-09-17 DIAGNOSIS — E1022 Type 1 diabetes mellitus with diabetic chronic kidney disease: Secondary | ICD-10-CM | POA: Diagnosis not present

## 2022-09-19 DIAGNOSIS — E1042 Type 1 diabetes mellitus with diabetic polyneuropathy: Secondary | ICD-10-CM | POA: Diagnosis not present

## 2022-09-22 DIAGNOSIS — E1042 Type 1 diabetes mellitus with diabetic polyneuropathy: Secondary | ICD-10-CM | POA: Diagnosis not present

## 2022-09-24 DIAGNOSIS — E559 Vitamin D deficiency, unspecified: Secondary | ICD-10-CM | POA: Diagnosis not present

## 2022-09-24 DIAGNOSIS — E1042 Type 1 diabetes mellitus with diabetic polyneuropathy: Secondary | ICD-10-CM | POA: Diagnosis not present

## 2022-09-24 DIAGNOSIS — E039 Hypothyroidism, unspecified: Secondary | ICD-10-CM | POA: Diagnosis not present

## 2022-10-17 DIAGNOSIS — E538 Deficiency of other specified B group vitamins: Secondary | ICD-10-CM | POA: Diagnosis not present

## 2022-11-18 DIAGNOSIS — Z0000 Encounter for general adult medical examination without abnormal findings: Secondary | ICD-10-CM | POA: Diagnosis not present

## 2022-11-18 DIAGNOSIS — Z9181 History of falling: Secondary | ICD-10-CM | POA: Diagnosis not present

## 2022-11-20 DIAGNOSIS — E538 Deficiency of other specified B group vitamins: Secondary | ICD-10-CM | POA: Diagnosis not present

## 2022-12-09 DIAGNOSIS — E1042 Type 1 diabetes mellitus with diabetic polyneuropathy: Secondary | ICD-10-CM | POA: Diagnosis not present

## 2022-12-21 DIAGNOSIS — E1042 Type 1 diabetes mellitus with diabetic polyneuropathy: Secondary | ICD-10-CM | POA: Diagnosis not present

## 2023-01-01 DIAGNOSIS — E538 Deficiency of other specified B group vitamins: Secondary | ICD-10-CM | POA: Diagnosis not present

## 2023-01-02 DIAGNOSIS — E1042 Type 1 diabetes mellitus with diabetic polyneuropathy: Secondary | ICD-10-CM | POA: Diagnosis not present

## 2023-01-02 DIAGNOSIS — E039 Hypothyroidism, unspecified: Secondary | ICD-10-CM | POA: Diagnosis not present

## 2023-01-02 DIAGNOSIS — Z9641 Presence of insulin pump (external) (internal): Secondary | ICD-10-CM | POA: Insufficient documentation

## 2023-01-31 DIAGNOSIS — E103293 Type 1 diabetes mellitus with mild nonproliferative diabetic retinopathy without macular edema, bilateral: Secondary | ICD-10-CM | POA: Diagnosis not present

## 2023-01-31 DIAGNOSIS — H52221 Regular astigmatism, right eye: Secondary | ICD-10-CM | POA: Diagnosis not present

## 2023-01-31 DIAGNOSIS — H5203 Hypermetropia, bilateral: Secondary | ICD-10-CM | POA: Diagnosis not present

## 2023-01-31 DIAGNOSIS — Z961 Presence of intraocular lens: Secondary | ICD-10-CM | POA: Diagnosis not present

## 2023-01-31 DIAGNOSIS — H348312 Tributary (branch) retinal vein occlusion, right eye, stable: Secondary | ICD-10-CM | POA: Diagnosis not present

## 2023-02-06 DIAGNOSIS — E538 Deficiency of other specified B group vitamins: Secondary | ICD-10-CM | POA: Diagnosis not present

## 2023-02-24 DIAGNOSIS — E1042 Type 1 diabetes mellitus with diabetic polyneuropathy: Secondary | ICD-10-CM | POA: Diagnosis not present

## 2023-03-13 DIAGNOSIS — E538 Deficiency of other specified B group vitamins: Secondary | ICD-10-CM | POA: Diagnosis not present

## 2023-03-21 DIAGNOSIS — E1042 Type 1 diabetes mellitus with diabetic polyneuropathy: Secondary | ICD-10-CM | POA: Diagnosis not present

## 2023-03-25 DIAGNOSIS — E1042 Type 1 diabetes mellitus with diabetic polyneuropathy: Secondary | ICD-10-CM | POA: Diagnosis not present

## 2023-04-08 DIAGNOSIS — Z794 Long term (current) use of insulin: Secondary | ICD-10-CM | POA: Diagnosis not present

## 2023-04-08 DIAGNOSIS — E559 Vitamin D deficiency, unspecified: Secondary | ICD-10-CM | POA: Diagnosis not present

## 2023-04-08 DIAGNOSIS — E1042 Type 1 diabetes mellitus with diabetic polyneuropathy: Secondary | ICD-10-CM | POA: Diagnosis not present

## 2023-04-08 DIAGNOSIS — Z9641 Presence of insulin pump (external) (internal): Secondary | ICD-10-CM | POA: Diagnosis not present

## 2023-04-08 DIAGNOSIS — E039 Hypothyroidism, unspecified: Secondary | ICD-10-CM | POA: Diagnosis not present

## 2023-04-17 DIAGNOSIS — D519 Vitamin B12 deficiency anemia, unspecified: Secondary | ICD-10-CM | POA: Diagnosis not present

## 2023-04-17 DIAGNOSIS — E785 Hyperlipidemia, unspecified: Secondary | ICD-10-CM | POA: Diagnosis not present

## 2023-04-17 DIAGNOSIS — E039 Hypothyroidism, unspecified: Secondary | ICD-10-CM | POA: Diagnosis not present

## 2023-04-17 DIAGNOSIS — E1022 Type 1 diabetes mellitus with diabetic chronic kidney disease: Secondary | ICD-10-CM | POA: Diagnosis not present

## 2023-04-17 DIAGNOSIS — N1831 Chronic kidney disease, stage 3a: Secondary | ICD-10-CM | POA: Diagnosis not present

## 2023-04-17 DIAGNOSIS — K118 Other diseases of salivary glands: Secondary | ICD-10-CM | POA: Diagnosis not present

## 2023-04-17 DIAGNOSIS — Z6828 Body mass index (BMI) 28.0-28.9, adult: Secondary | ICD-10-CM | POA: Diagnosis not present

## 2023-04-17 DIAGNOSIS — I2510 Atherosclerotic heart disease of native coronary artery without angina pectoris: Secondary | ICD-10-CM | POA: Diagnosis not present

## 2023-05-21 DIAGNOSIS — E538 Deficiency of other specified B group vitamins: Secondary | ICD-10-CM | POA: Diagnosis not present

## 2023-06-19 DIAGNOSIS — E1042 Type 1 diabetes mellitus with diabetic polyneuropathy: Secondary | ICD-10-CM | POA: Diagnosis not present

## 2023-06-26 DIAGNOSIS — E785 Hyperlipidemia, unspecified: Secondary | ICD-10-CM | POA: Diagnosis not present

## 2023-06-26 DIAGNOSIS — N1831 Chronic kidney disease, stage 3a: Secondary | ICD-10-CM | POA: Diagnosis not present

## 2023-06-26 DIAGNOSIS — I10 Essential (primary) hypertension: Secondary | ICD-10-CM | POA: Diagnosis not present

## 2023-06-26 DIAGNOSIS — Z6827 Body mass index (BMI) 27.0-27.9, adult: Secondary | ICD-10-CM | POA: Diagnosis not present

## 2023-06-26 DIAGNOSIS — D519 Vitamin B12 deficiency anemia, unspecified: Secondary | ICD-10-CM | POA: Diagnosis not present

## 2023-06-26 DIAGNOSIS — I2510 Atherosclerotic heart disease of native coronary artery without angina pectoris: Secondary | ICD-10-CM | POA: Diagnosis not present

## 2023-06-26 DIAGNOSIS — E039 Hypothyroidism, unspecified: Secondary | ICD-10-CM | POA: Diagnosis not present

## 2023-06-26 DIAGNOSIS — E1022 Type 1 diabetes mellitus with diabetic chronic kidney disease: Secondary | ICD-10-CM | POA: Diagnosis not present

## 2023-07-11 DIAGNOSIS — E039 Hypothyroidism, unspecified: Secondary | ICD-10-CM | POA: Diagnosis not present

## 2023-07-11 DIAGNOSIS — Z9641 Presence of insulin pump (external) (internal): Secondary | ICD-10-CM | POA: Diagnosis not present

## 2023-07-11 DIAGNOSIS — E109 Type 1 diabetes mellitus without complications: Secondary | ICD-10-CM | POA: Diagnosis not present

## 2023-07-30 DIAGNOSIS — E538 Deficiency of other specified B group vitamins: Secondary | ICD-10-CM | POA: Diagnosis not present

## 2023-08-12 DIAGNOSIS — E1042 Type 1 diabetes mellitus with diabetic polyneuropathy: Secondary | ICD-10-CM | POA: Diagnosis not present

## 2023-08-14 DIAGNOSIS — M25552 Pain in left hip: Secondary | ICD-10-CM | POA: Diagnosis not present

## 2023-08-14 DIAGNOSIS — M1612 Unilateral primary osteoarthritis, left hip: Secondary | ICD-10-CM | POA: Diagnosis not present

## 2023-08-19 DIAGNOSIS — M1612 Unilateral primary osteoarthritis, left hip: Secondary | ICD-10-CM | POA: Diagnosis not present

## 2023-08-26 DIAGNOSIS — E1042 Type 1 diabetes mellitus with diabetic polyneuropathy: Secondary | ICD-10-CM | POA: Diagnosis not present

## 2023-08-29 DIAGNOSIS — E1042 Type 1 diabetes mellitus with diabetic polyneuropathy: Secondary | ICD-10-CM | POA: Diagnosis not present

## 2023-09-03 DIAGNOSIS — E538 Deficiency of other specified B group vitamins: Secondary | ICD-10-CM | POA: Diagnosis not present

## 2023-09-17 DIAGNOSIS — Z1211 Encounter for screening for malignant neoplasm of colon: Secondary | ICD-10-CM | POA: Diagnosis not present

## 2023-09-17 DIAGNOSIS — Z1212 Encounter for screening for malignant neoplasm of rectum: Secondary | ICD-10-CM | POA: Diagnosis not present

## 2023-09-17 DIAGNOSIS — E1042 Type 1 diabetes mellitus with diabetic polyneuropathy: Secondary | ICD-10-CM | POA: Diagnosis not present

## 2023-09-24 LAB — COLOGUARD: COLOGUARD: POSITIVE — AB

## 2023-10-02 DIAGNOSIS — E039 Hypothyroidism, unspecified: Secondary | ICD-10-CM | POA: Diagnosis not present

## 2023-10-02 DIAGNOSIS — E785 Hyperlipidemia, unspecified: Secondary | ICD-10-CM | POA: Diagnosis not present

## 2023-10-02 DIAGNOSIS — D519 Vitamin B12 deficiency anemia, unspecified: Secondary | ICD-10-CM | POA: Diagnosis not present

## 2023-10-02 DIAGNOSIS — I10 Essential (primary) hypertension: Secondary | ICD-10-CM | POA: Diagnosis not present

## 2023-10-02 DIAGNOSIS — Z6827 Body mass index (BMI) 27.0-27.9, adult: Secondary | ICD-10-CM | POA: Diagnosis not present

## 2023-10-02 DIAGNOSIS — I2510 Atherosclerotic heart disease of native coronary artery without angina pectoris: Secondary | ICD-10-CM | POA: Diagnosis not present

## 2023-10-02 DIAGNOSIS — E1022 Type 1 diabetes mellitus with diabetic chronic kidney disease: Secondary | ICD-10-CM | POA: Diagnosis not present

## 2023-10-02 DIAGNOSIS — E559 Vitamin D deficiency, unspecified: Secondary | ICD-10-CM | POA: Diagnosis not present

## 2023-10-02 DIAGNOSIS — N1831 Chronic kidney disease, stage 3a: Secondary | ICD-10-CM | POA: Diagnosis not present

## 2023-11-05 DIAGNOSIS — E538 Deficiency of other specified B group vitamins: Secondary | ICD-10-CM | POA: Diagnosis not present

## 2023-11-07 DIAGNOSIS — I10 Essential (primary) hypertension: Secondary | ICD-10-CM | POA: Diagnosis not present

## 2023-11-07 DIAGNOSIS — L0231 Cutaneous abscess of buttock: Secondary | ICD-10-CM | POA: Diagnosis not present

## 2023-11-21 DIAGNOSIS — Z9641 Presence of insulin pump (external) (internal): Secondary | ICD-10-CM | POA: Diagnosis not present

## 2023-11-21 DIAGNOSIS — E039 Hypothyroidism, unspecified: Secondary | ICD-10-CM | POA: Diagnosis not present

## 2023-11-21 DIAGNOSIS — E1042 Type 1 diabetes mellitus with diabetic polyneuropathy: Secondary | ICD-10-CM | POA: Diagnosis not present

## 2023-12-10 DIAGNOSIS — E538 Deficiency of other specified B group vitamins: Secondary | ICD-10-CM | POA: Diagnosis not present

## 2023-12-13 NOTE — Progress Notes (Deleted)
 Cardiology Office Note:    Date:  12/13/2023   ID:  Jaclyn [name], DOB 06-18-1945, MRN [id number]  PCP:  Erick Greig LABOR, NP  Cardiologist:  Redell Leiter, MD    Referring MD: Erick Greig LABOR, NP    ASSESSMENT:    No diagnosis found. PLAN:    In order of problems listed above:  ***   Next appointment: ***   Medication Adjustments/Labs and Tests Ordered: Current medicines are reviewed at length with the patient today.  Concerns regarding medicines are outlined above.  No orders of the defined types were placed in this encounter.  No orders of the defined types were placed in this encounter.    History of Present Illness:    Jaclyn [name] is a 78 y.o. female with a hx of CAD with CABG and bioprosthetic aortic valve 13 hypertension dyslipidemia statin intolerance type 1 diabetes  bilateral ICA stenosis and carotid bruit last seen 09/04/2022.  She had an echocardiogram at [facility] 08/23/2022 showing normal left ventricular ejection fraction 55 to 60% bioprosthetic aortic valve with normal function mild mitral and mild tricuspid regurgitation. Carotid duplex 08/26/2022 showed greater than 70% stenosis of the right internal carotid artery 50% left internal carotid artery stenosis. She had an event monitor reported 08/26/2022 both supraventricular and ventricular ectopy were rare however she had brief episodes of atrial tachycardia and symptomatic PVCs and APCs. Compliance with diet, lifestyle and medications: *** Past Medical History:  Diagnosis Date   Abnormal mammogram of both breasts    Acute rhinosinusitis    Acute upper respiratory infection, unspecified    Allergic rhinitis    Anemia    blood transfusion- 6 yrs. ago by Dr. Townsend    Anemia due to infection 11/13/2019   Resolved with B12 injections, iron in diet and Rx of pneumonia     Anxiety    Aortic stenosis    a. 09/11/2011 s/p AVR - 21mm [employer] Ease Pericardial Tissue Valve (with CABG x 3 as  above).   Aortic stenosis, severe 08/22/2011   AS (aortic stenosis) 08/28/2011   Benign essential HTN 08/07/2015   Body mass index 30.0-30.9, adult    CAD in native artery    Cancer (HCC)    Carotid bruit 06/13/2011   Carotid stenosis, right    Chronic constipation    COPD (chronic obstructive pulmonary disease) (HCC)    Coronary artery disease    a. 09/11/2011 CABGx 3: LIMA->LAD, VG->D1, VG->PLV (with AVR - 21mm [employer] Ease Pericardial Tissue Valve).   Coronary atherosclerosis of native coronary artery 08/22/2011   Depression    Dermatitis    Diabetic polyneuropathy associated with type 1 diabetes mellitus (HCC) 08/07/2015   DM (diabetes mellitus) (HCC)    type I - treated with insulin  pump /w x103yrs   Essential hypertension, benign    Fatigue    Fibrocystic disease of breast    Foot pain    Gastroparesis    Hair loss    History of lung cancer    HTN (hypertension)    a. 09/2011 BP's running low (in setting of weight loss and elimination of salt from diet).   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    Mass of both breasts on mammogram    Menopausal disorder    Menopause    Microalbuminuria    Murmur    Myalgia    Orthostatic hypotension    Palpitations 06/13/2011   Peripheral neuropathy    Personal history of fall    Plantar fasciitis    Positive depression screening    Radiation fibrosis of lung    S/P aortic valve replacement with bioprosthetic valve 09/11/2011   21 mm [facility] Ease pericardial tissue valve    S/P AVR (aortic valve replacement) 10/07/2011   S/P CABG x 3 09/11/2011   Free LIMA to LAD, SVG to D1, SVG to RPL, EVH via right thigh and leg    S/P radiation therapy    Radiation therapy to  chest and mediastinum for small cell carcinoma of the lung   Screening for alcoholism    Seasonal allergies    Seizures (HCC)    33 yrs. ago, was on phenobarbital for sometime, off it for 20 yrs.    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      EKGs/Labs/Other Studies Reviewed:    The following studies were reviewed today:  Cardiac Studies & Procedures   ______________________________________________________________________________________________     ECHOCARDIOGRAM  ECHOCARDIOGRAM COMPLETE 11/19/2017  Narrative *[name]* [address] [place], [place] [address] [phone]  ------------------------------------------------------------------- Transthoracic Echocardiography  Patient:    Jaclyn, [name] MR #:       [id number] Study Date: 11/19/2017 Gender:     F Age:        28 Height:     167.6 cm Weight:     85.2 kg BSA:        2.02 m^2 Pt. Status: Room:  SONOGRAPHER  Jaclyn [name], Will TISA Jude, Katheryn BROCKS REFERRING    Jude Katheryn BROCKS REFERRING    Moon, Amy A PERFORMING   [place], Outpatient ATTENDING    Annabella Scarce, MD  cc:  ------------------------------------------------------------------- LV EF: 55% -   60%  ------------------------------------------------------------------- Indications:      (I25.10).  ------------------------------------------------------------------- History:   PMH:  AVR (21 mm [facility] Ease pericardial valve) Acquired from the patient and from the patient&'s chart.  Coronary artery disease.   Risk factors:  Hypertension. Diabetes mellitus. Dyslipidemia.  ------------------------------------------------------------------- Study Conclusions  - Left ventricle: The cavity size was normal. Systolic function was normal. The estimated ejection fraction was in the range of 55% to 60%. Wall motion was normal; there were no regional wall motion abnormalities. Features are consistent with a pseudonormal left ventricular filling pattern, with concomitant abnormal relaxation and increased filling pressure (grade 2 diastolic dysfunction). Doppler parameters are consistent with high ventricular filling pressure. - Aortic valve: A bioprosthesis was present and functioning normally. Transvalvular velocity was within the normal range. There was no stenosis. There was no regurgitation. Mean gradient (S): 12 mm Hg. - Mitral valve: Transvalvular velocity was within the normal range. There was no evidence for stenosis. There was mild regurgitation. - Right  ventricle: The cavity size was normal. Wall thickness was normal. Systolic function was normal. - Atrial septum: No defect or patent foramen ovale was identified by color flow Doppler. - Tricuspid valve: There was mild regurgitation. - Pulmonary arteries: Systolic pressure was within the normal range. PA peak pressure: 32 mm Hg (S).  ------------------------------------------------------------------- Labs, prior tests, procedures, and surgery: Coronary artery bypass grafting.  ------------------------------------------------------------------- Study data:  Comparison was made to the study of 11/22/2014.  Study status:  Routine.  Procedure:  The patient reported no pain pre or post test. Transthoracic echocardiography for left ventricular function evaluation. Image quality was adequate.  Study completion: There were no complications.          Transthoracic echocardiography.  M-mode, complete 2D, spectral Doppler, and color Doppler.   Birthdate:  Patient birthdate: 1945-05-27.  Age:  Patient is 78 yr old.  Sex:  Gender: female.    BMI: 30.3 kg/m^2.  Blood pressure:     152/62  Patient status:  Outpatient.  Study date: Study date: 11/19/2017. Study time: 11:43 AM.  Location:  [facility]  -------------------------------------------------------------------  ------------------------------------------------------------------- Left ventricle:  The cavity size was normal. Systolic function was normal. The estimated ejection fraction was in the range of 55% to 60%. Wall motion was normal; there were no regional wall motion abnormalities. Features are consistent with a pseudonormal left ventricular filling pattern, with concomitant abnormal relaxation and increased filling pressure (grade 2 diastolic dysfunction). Doppler parameters are consistent with high ventricular filling pressure.  ------------------------------------------------------------------- Aortic valve:  A bioprosthesis was present and functioning normally. Mobility was not restricted.  Doppler:  Transvalvular velocity was within the normal range. There was no stenosis. There was no regurgitation.    VTI ratio of LVOT to aortic valve: 0.45. Valve area (VTI): 1.29 cm^2. Indexed valve area (VTI): 0.64 cm^2/m^2. Peak velocity ratio of LVOT to aortic valve: 0.43. Valve area (Vmax): 1.21 cm^2. Indexed valve area (Vmax): 0.6 cm^2/m^2. Mean velocity ratio of LVOT to aortic valve: 0.43. Valve area (Vmean): 1.22 cm^2. Indexed valve area (Vmean): 0.6 cm^2/m^2. Mean gradient (S): 12 mm Hg. Peak gradient (S): 24 mm Hg.  ------------------------------------------------------------------- Aorta:  Aortic root: The aortic root was normal in size.  ------------------------------------------------------------------- Mitral valve:   Structurally normal valve.   Mobility was not restricted.  Doppler:  Transvalvular velocity was within the normal range. There was no  evidence for stenosis. There was mild regurgitation.    Valve area by pressure half-time: 3.93 cm^2. Indexed valve area by pressure half-time: 1.95 cm^2/m^2.    Peak gradient (D): 5 mm Hg.  ------------------------------------------------------------------- Left atrium:  The atrium was normal in size.  ------------------------------------------------------------------- Atrial septum:  No defect or patent foramen ovale was identified by color flow Doppler.  ------------------------------------------------------------------- Right ventricle:  The cavity size was normal. Wall thickness was normal. Systolic function was normal.  ------------------------------------------------------------------- Pulmonic valve:    Structurally normal valve.   Cusp separation was normal.  Doppler:  Transvalvular velocity was within the normal range. There was no evidence for stenosis. There was no regurgitation.  ------------------------------------------------------------------- Tricuspid valve:   Structurally normal valve.    Doppler: Transvalvular velocity was within the normal range. There was mild regurgitation.  ------------------------------------------------------------------- Pulmonary artery:   The main pulmonary artery was normal-sized. Systolic pressure was within the normal range.  ------------------------------------------------------------------- Right atrium:  The atrium was normal in size.  ------------------------------------------------------------------- Pericardium:  There was no pericardial effusion.  ------------------------------------------------------------------- Systemic veins: Inferior vena cava: The vessel was normal in  size. The respirophasic diameter changes were in the normal range (= 50%), consistent with normal central venous pressure.  ------------------------------------------------------------------- Measurements  Left ventricle                            Value          Reference LV ID, ED, PLAX chordal          (L)     35    mm       43 - 52 LV ID, ES, PLAX chordal                  25    mm       23 - 38 LV fx shortening, PLAX chordal           29    %        >=29 LV PW thickness, ED                      12    mm       ---------- IVS/LV PW ratio, ED                      1              <=1.3 Stroke volume, 2D                        66    ml       ---------- Stroke volume/bsa, 2D                    33    ml/m^2   ---------- LV e&', lateral                           9.46  cm/s     ---------- LV E/e&', lateral                         12.37          ---------- LV e&', medial                            6.96  cm/s     ---------- LV E/e&', medial                          16.81          ---------- LV e&', average                           8.21  cm/s     ---------- LV E/e&', average                         14.25          ----------  Ventricular septum                       Value          Reference IVS thickness, ED                        12    mm       ----------  LVOT                                     Value          Reference LVOT ID, S                               19    mm       ---------- LVOT area                                2.84  cm^2     ---------- LVOT ID                                  19    mm       ---------- LVOT peak velocity, S                    104   cm/s     ---------- LVOT mean velocity, S                    68.5  cm/s     ---------- LVOT VTI, S                              23.4  cm       ---------- Stroke volume (SV), LVOT DP              66.3  ml       ---------- Stroke index (SV/bsa), LVOT DP           32.9  ml/m^2   ----------  Aortic valve                             Value          Reference Aortic valve peak velocity, S            244   cm/s     ---------- Aortic valve mean velocity, S            160   cm/s     ---------- Aortic valve VTI, S                      51.5  cm       ---------- Aortic mean gradient, S                   12    mm Hg    ---------- Aortic peak gradient, S                  24    mm Hg    ---------- VTI ratio, LVOT/AV                       0.45           ---------- Aortic valve area, VTI                   1.29  cm^2     ---------- Aortic valve area/bsa, VTI  0.64  cm^2/m^2 ---------- Velocity ratio, peak, LVOT/AV            0.43           ---------- Aortic valve area, peak velocity         1.21  cm^2     ---------- Aortic valve area/bsa, peak              0.6   cm^2/m^2 ---------- velocity Velocity ratio, mean, LVOT/AV            0.43           ---------- Aortic valve area, mean velocity         1.22  cm^2     ---------- Aortic valve area/bsa, mean              0.6   cm^2/m^2 ---------- velocity  Aorta                                    Value          Reference Aortic root ID, ED                       30    mm       ----------  Left atrium                              Value          Reference LA ID, A-P, ES                           33    mm       ---------- LA ID/bsa, A-P                           1.64  cm/m^2   <=2.2 LA volume, S                             50.2  ml       ---------- LA volume/bsa, S                         24.9  ml/m^2   ---------- LA volume, ES, 1-p A4C                   55.7  ml       ---------- LA volume/bsa, ES, 1-p A4C               27.6  ml/m^2   ---------- LA volume, ES, 1-p A2C                   45.4  ml       ---------- LA volume/bsa, ES, 1-p A2C               22.5  ml/m^2   ----------  Mitral valve                             Value          Reference Mitral E-wave peak velocity  117   cm/s     ---------- Mitral A-wave peak velocity              103   cm/s     ---------- Mitral deceleration time                 190   ms       150 - 230 Mitral pressure half-time                56    ms       ---------- Mitral peak gradient, D                  5     mm Hg    ---------- Mitral E/A ratio, peak                   1.1             ---------- Mitral valve area, PHT, DP               3.93  cm^2     ---------- Mitral valve area/bsa, PHT, DP           1.95  cm^2/m^2 ----------  Pulmonary arteries                       Value          Reference PA pressure, S, DP               (H)     32    mm Hg    <=30  Tricuspid valve                          Value          Reference Tricuspid regurg peak velocity           271   cm/s     ---------- Tricuspid peak RV-RA gradient            29    mm Hg    ----------  Right atrium                             Value          Reference RA ID, S-I, ES, A4C              (H)     54.3  mm       34 - 49 RA area, ES, A4C                         16.7  cm^2     8.3 - 19.5 RA volume, ES, A/L                       44    ml       ---------- RA volume/bsa, ES, A/L                   21.8  ml/m^2   ----------  Systemic veins                           Value          Reference Estimated CVP  3     mm Hg    ----------  Right ventricle                          Value          Reference TAPSE                                    12.6  mm       ---------- RV pressure, S, DP               (H)     32    mm Hg    <=30 RV s&', lateral, S                        9.46  cm/s     ----------  Legend: (L)  and  (H)  mark values outside specified reference range.  ------------------------------------------------------------------- Prepared and Electronically Authenticated by  Annabella Scarce, MD 2019-10-09T15:29:40    MONITORS  LONG TERM MONITOR (3-14 DAYS) 08/26/2022  Narrative Patch Wear Time:  6 days and 22 hours (2024-07-02T10:32:57-0400 to 2024-07-09T09:08:35-0400)  Patient had a min HR of 67 bpm, max HR of 152 bpm, and avg HR of 83 bpm. Predominant underlying rhythm was Sinus Rhythm.  There were no sinus pauses of 3 seconds or greater and no episodes of second or third-degree AV nodal block.  There were 52 triggered events all sinus rhythm.  At times PVCs were present  usually isolated but on 1 occasion frequent.  At times APCs were noted usually a single beat but there were 2 brief episodes of SVT that were triggered.  3 Supraventricular Tachycardia runs occurred, the run with the fastest interval lasting 5 beats with a max rate of 152 bpm, the longest lasting 7 beats with an avg rate of 135 bpm.  There were no episodes of atrial fibrillation or flutter  Isolated SVEs were rare (<1.0%), SVE Couplets were rare (<1.0%), and SVE Triplets were rare (<1.0%).  Isolated VEs were rare (<1.0%), VE Couplets were rare (<1.0%), and no VE Triplets were present.       ______________________________________________________________________________________________          Recent Labs: No results found for requested labs within last 365 days.  Recent Lipid Panel No results found for: CHOL, TRIG, HDL, CHOLHDL, VLDL, LDLCALC, LDLDIRECT  Physical Exam:    VS:  There were no vitals taken for this visit.    Wt Readings from Last 3 Encounters:  09/04/22 181 lb 6.4 oz (82.3 kg)  08/31/21 182 lb (82.6 kg)  07/05/20 181 lb (82.1 kg)     GEN: *** Well nourished, well developed in no acute distress HEENT: Normal NECK: No JVD; No carotid bruits LYMPHATICS: No lymphadenopathy CARDIAC: ***RRR, no murmurs, rubs, gallops RESPIRATORY:  Clear to auscultation without rales, wheezing or rhonchi  ABDOMEN: Soft, non-tender, non-distended MUSCULOSKELETAL:  No edema; No deformity  SKIN: Warm and dry NEUROLOGIC:  Alert and oriented x 3 PSYCHIATRIC:  Normal affect    Signed, Redell Leiter, MD  12/13/2023 3:00 PM    [facility]

## 2023-12-15 ENCOUNTER — Ambulatory Visit: Admitting: Cardiology

## 2023-12-15 DIAGNOSIS — I6523 Occlusion and stenosis of bilateral carotid arteries: Secondary | ICD-10-CM

## 2023-12-15 DIAGNOSIS — Z951 Presence of aortocoronary bypass graft: Secondary | ICD-10-CM

## 2023-12-15 DIAGNOSIS — I2510 Atherosclerotic heart disease of native coronary artery without angina pectoris: Secondary | ICD-10-CM

## 2023-12-15 DIAGNOSIS — Z952 Presence of prosthetic heart valve: Secondary | ICD-10-CM

## 2023-12-15 DIAGNOSIS — E782 Mixed hyperlipidemia: Secondary | ICD-10-CM

## 2023-12-15 DIAGNOSIS — Z789 Other specified health status: Secondary | ICD-10-CM

## 2023-12-16 DIAGNOSIS — E1042 Type 1 diabetes mellitus with diabetic polyneuropathy: Secondary | ICD-10-CM | POA: Diagnosis not present

## 2023-12-18 NOTE — Progress Notes (Unsigned)
 Cardiology Office Note:    Date:  12/19/2023   ID:  COURTNEI RUDDELL, DOB Aug 24, 1945, MRN [id number]  PCP:  Erick Greig LABOR, NP  Cardiologist:  Redell Leiter, MD    Referring MD: Erick Greig LABOR, NP    ASSESSMENT:    1. Coronary artery disease involving native heart without angina pectoris, unspecified vessel or lesion type   2. Hx of CABG   3. S/P AVR (aortic valve replacement)   4. Benign essential HTN   5. Mixed hyperlipidemia   6. Statin intolerance    PLAN:    In order of problems listed above:  Doing well with CAD asymptomatic no anginal discomfort continue aspirin  and will not except lipid-lowering therapy or testing. Normal AVR function echo Well-controlled continue losartan  Poorly controlled will not except lipid-lowering treatment or testing   Next appointment: 1 year   Medication Adjustments/Labs and Tests Ordered: Current medicines are reviewed at length with the patient today.  Concerns regarding medicines are outlined above.  Orders Placed This Encounter  Procedures   EKG 12-Lead   No orders of the defined types were placed in this encounter.    History of Present Illness:    JEANNA GIUFFRE is a 78 y.o. female with a hx of CAD with CABG and bioprosthetic aortic valve 2013 hypertension dyslipidemia statin intolerance type 1 diabetes  bilateral ICA stenosis and carotid bruit last seen 09/04/2022.  She had an echocardiogram at [facility] 08/23/2022 showing normal left ventricular ejection fraction 55 to 60% bioprosthetic aortic valve with normal function mild mitral and mild tricuspid regurgitation. Carotid duplex 08/26/2022 showed greater than 70% stenosis of the right internal carotid artery 50% left internal carotid artery stenosis. She had an event monitor reported 08/26/2022 both supraventricular and ventricular ectopy were rare however she had brief episodes of atrial tachycardia and symptomatic PVCs and APCs.  Compliance with diet, lifestyle and  medications: Yes  She is here with her daughter today pleased with the quality of her life has had no cardiovascular complaints edema shortness of breath chest pain palpitation or syncope I approached her about lipid-lowering therapy and she is adamant that she will not take medications said that she had muscle pain and weakness with a statin Offered other agents particularly ezetimibe  and again declined She does not want to have her lipids checked. Past Medical History:  Diagnosis Date   Abnormal mammogram of both breasts    Acute rhinosinusitis    Acute upper respiratory infection, unspecified    Allergic rhinitis    Anemia    blood transfusion- 6 yrs. ago by Dr. Townsend    Anemia due to infection 11/13/2019   Resolved with B12 injections, iron in diet and Rx of pneumonia     Anxiety    Aortic stenosis    a. 09/11/2011 s/p AVR - 21mm [employer] Ease Pericardial Tissue Valve (with CABG x 3 as above).   Aortic stenosis, severe 08/22/2011   AS (aortic stenosis) 08/28/2011   Benign essential HTN 08/07/2015   Body mass index 30.0-30.9, adult    CAD in native artery    Cancer (HCC)    Carotid bruit 06/13/2011   Carotid stenosis, right    Chronic constipation    COPD (chronic obstructive pulmonary disease) (HCC)    Coronary artery disease    a. 09/11/2011 CABGx 3: LIMA->LAD, VG->D1, VG->PLV (with AVR - 21mm [employer] Magna Ease Pericardial Tissue Valve).   Coronary atherosclerosis of native coronary artery 08/22/2011   Depression  Dermatitis    Diabetic polyneuropathy associated with type 1 diabetes mellitus (HCC) 08/07/2015   DM (diabetes mellitus) (HCC)    type I - treated with insulin  pump /w x65yrs   Essential hypertension, benign    Fatigue    Fibrocystic disease of breast    Foot pain    Gastroparesis    Hair loss    History of lung cancer    HTN (hypertension)    a. 09/2011 BP's running low (in setting of weight loss and elimination of salt from diet).   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    Mass of both breasts on mammogram    Menopausal disorder    Menopause    Microalbuminuria    Murmur    Myalgia    Orthostatic hypotension    Palpitations 06/13/2011   Peripheral neuropathy    Personal history of fall    Plantar fasciitis    Positive depression screening    Radiation fibrosis of lung    S/P aortic valve replacement with bioprosthetic valve 09/11/2011   21 mm [facility] Ease pericardial tissue valve    S/P AVR (aortic valve replacement) 10/07/2011   S/P CABG x 3 09/11/2011   Free LIMA to LAD, SVG to D1, SVG to RPL, EVH via right thigh and leg    S/P radiation therapy    Radiation therapy to chest and mediastinum for small cell carcinoma of the lung   Screening for alcoholism    Seasonal allergies    Seizures (HCC)    33 yrs. ago, was on phenobarbital for sometime, off it for 20 yrs.    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 Swallow whole.   Cyanocobalamin (B-12 COMPLIANCE INJECTION) 1000 MCG/ML KIT Inject 1,000 mcg as directed every 30 (thirty) days.   levothyroxine  (SYNTHROID ) 137 MCG tablet Take 137 mcg by mouth every morning.   losartan  (COZAAR ) 25 MG tablet Take 1 tablet by mouth once daily   MAGNESIUM -OXIDE 400 (240 Mg) MG tablet Take 1 tablet by mouth 2 (two) times daily.   NOVOLOG 100 UNIT/ML  injection Inject into the skin.   Vitamin D , Ergocalciferol , (DRISDOL ) 1.25 MG (50000 UNIT) CAPS capsule Take 50,000 Units by mouth once a week.      EKGs/Labs/Other Studies Reviewed:    The following studies were reviewed today:  EKG Interpretation Date/Time:  Friday December 19 2023 16:03:02 EST Ventricular Rate:  91 PR Interval:  162 QRS Duration:  76 QT Interval:  376 QTC Calculation: 462 R Axis:   62  Text Interpretation: Normal sinus rhythm Normal ECG When compared with ECG of 04-Sep-2022 11:01, No significant change was found Confirmed by Monetta Rogue ([id number]) on 12/19/2023 4:09:05 PM     Physical Exam:  VS:  BP 128/70   Pulse 91   Ht 5' 6 (1.676 m)   Wt 172 lb 9.6 oz (78.3 kg)   SpO2 96%   BMI 27.86 kg/m     Wt Readings from Last 3 Encounters:  12/19/23 172 lb 9.6 oz (78.3 kg)  09/04/22 181 lb 6.4 oz (82.3 kg)  08/31/21 182 lb (82.6 kg)     GEN: Appears her age elderly woman well nourished, well developed in no acute distress HEENT: Normal NECK: No JVD; No carotid bruits LYMPHATICS: No lymphadenopathy CARDIAC: RRR, 1 of 6 flow murmur appropriate for bioprosthetic aortic valve RESPIRATORY:  Clear to auscultation without rales, wheezing or rhonchi  ABDOMEN: Soft, non-tender, non-distended MUSCULOSKELETAL:  No edema; No deformity  SKIN: Warm and dry NEUROLOGIC:  Alert and oriented x 3 PSYCHIATRIC:  Normal affect    Signed, Redell Leiter, MD  12/19/2023 4:22 PM    [facility]

## 2023-12-19 ENCOUNTER — Ambulatory Visit: Attending: Cardiology | Admitting: Cardiology

## 2023-12-19 ENCOUNTER — Encounter: Payer: Self-pay | Admitting: Cardiology

## 2023-12-19 VITALS — BP 128/70 | HR 91 | Ht 66.0 in | Wt 172.6 lb

## 2023-12-19 DIAGNOSIS — E782 Mixed hyperlipidemia: Secondary | ICD-10-CM

## 2023-12-19 DIAGNOSIS — Z952 Presence of prosthetic heart valve: Secondary | ICD-10-CM | POA: Diagnosis not present

## 2023-12-19 DIAGNOSIS — I10 Essential (primary) hypertension: Secondary | ICD-10-CM

## 2023-12-19 DIAGNOSIS — I2510 Atherosclerotic heart disease of native coronary artery without angina pectoris: Secondary | ICD-10-CM | POA: Diagnosis not present

## 2023-12-19 DIAGNOSIS — Z789 Other specified health status: Secondary | ICD-10-CM

## 2023-12-19 DIAGNOSIS — Z951 Presence of aortocoronary bypass graft: Secondary | ICD-10-CM

## 2023-12-19 NOTE — Patient Instructions (Signed)

## 2024-01-14 DIAGNOSIS — E538 Deficiency of other specified B group vitamins: Secondary | ICD-10-CM | POA: Diagnosis not present
